# Patient Record
Sex: Female | Born: 1945
Health system: Southern US, Community
[De-identification: ages and names within clinical notes are randomized; demographics above are authoritative.]

## PROBLEM LIST (undated history)

## (undated) DIAGNOSIS — Z833 Family history of diabetes mellitus: Secondary | ICD-10-CM

## (undated) DIAGNOSIS — I10 Essential (primary) hypertension: Secondary | ICD-10-CM

## (undated) DIAGNOSIS — Z96651 Presence of right artificial knee joint: Secondary | ICD-10-CM

## (undated) DIAGNOSIS — E785 Hyperlipidemia, unspecified: Secondary | ICD-10-CM

## (undated) DIAGNOSIS — M8580 Other specified disorders of bone density and structure, unspecified site: Secondary | ICD-10-CM

## (undated) DIAGNOSIS — E119 Type 2 diabetes mellitus without complications: Secondary | ICD-10-CM

## (undated) HISTORY — DX: Family history of diabetes mellitus: Z83.3

## (undated) HISTORY — PX: BREAST EXCISIONAL BIOPSY: SUR124

## (undated) HISTORY — DX: Hyperlipidemia, unspecified: E78.5

## (undated) HISTORY — PX: CHOLECYSTECTOMY: SHX55

## (undated) HISTORY — DX: Other specified disorders of bone density and structure, unspecified site: M85.80

## (undated) HISTORY — DX: Type 2 diabetes mellitus without complications: E11.9

## (undated) HISTORY — DX: Presence of right artificial knee joint: Z96.651

## (undated) HISTORY — PX: OTHER SURGICAL HISTORY: SHX169

---

## 2018-03-24 ENCOUNTER — Other Ambulatory Visit: Payer: Self-pay

## 2018-03-24 ENCOUNTER — Encounter: Payer: Self-pay | Admitting: Family Medicine

## 2018-03-24 ENCOUNTER — Ambulatory Visit: Payer: Medicare Other | Attending: Family Medicine | Admitting: Family Medicine

## 2018-03-24 VITALS — BP 126/74 | HR 69 | Temp 98.4°F | Resp 18 | Ht <= 58 in | Wt 168.2 lb

## 2018-03-24 DIAGNOSIS — K6289 Other specified diseases of anus and rectum: Secondary | ICD-10-CM | POA: Insufficient documentation

## 2018-03-24 DIAGNOSIS — Z79899 Other long term (current) drug therapy: Secondary | ICD-10-CM | POA: Insufficient documentation

## 2018-03-24 DIAGNOSIS — Z833 Family history of diabetes mellitus: Secondary | ICD-10-CM | POA: Diagnosis not present

## 2018-03-24 DIAGNOSIS — E119 Type 2 diabetes mellitus without complications: Secondary | ICD-10-CM

## 2018-03-24 DIAGNOSIS — Z96651 Presence of right artificial knee joint: Secondary | ICD-10-CM | POA: Diagnosis not present

## 2018-03-24 DIAGNOSIS — E785 Hyperlipidemia, unspecified: Secondary | ICD-10-CM | POA: Diagnosis not present

## 2018-03-24 DIAGNOSIS — K644 Residual hemorrhoidal skin tags: Secondary | ICD-10-CM | POA: Insufficient documentation

## 2018-03-24 DIAGNOSIS — R197 Diarrhea, unspecified: Secondary | ICD-10-CM | POA: Insufficient documentation

## 2018-03-24 DIAGNOSIS — I10 Essential (primary) hypertension: Secondary | ICD-10-CM

## 2018-03-24 DIAGNOSIS — M8580 Other specified disorders of bone density and structure, unspecified site: Secondary | ICD-10-CM | POA: Diagnosis not present

## 2018-03-24 DIAGNOSIS — Z9049 Acquired absence of other specified parts of digestive tract: Secondary | ICD-10-CM | POA: Insufficient documentation

## 2018-03-24 DIAGNOSIS — R208 Other disturbances of skin sensation: Secondary | ICD-10-CM | POA: Diagnosis not present

## 2018-03-24 DIAGNOSIS — Z23 Encounter for immunization: Secondary | ICD-10-CM | POA: Diagnosis not present

## 2018-03-24 NOTE — Progress Notes (Signed)
Subjective:    Patient ID: Emily Hamilton, female    DOB: 16-May-1946, 72 y.o.   MRN: [id number]   Due to language barrier, a video interpreter service was used at today's visit  HPI 72 year old female new to the practice.  Patient states that she recently moved from [place].  Patient with complaint of onset of few days ago of a few episodes of diarrhea and patient states that with the diarrhea she also had burning in her rectal area and patient states that she continues to have the burning sensation.  Patient did not see any blood in her stool and no dark stools.  Patient states that her stools are now normal.  Patient did bring medical records with her from [place].  Per her past medical records, patient with a positive fecal occult blood test in February of this year and patient was referred for colonoscopy however she was also having URI symptoms at this time and had to postpone the colonoscopy.  Patient has not yet had a colonoscopy that was recommended.      Patient also has past medical history significant for type 2 diabetes which is well controlled.  Patient had hemoglobin A1c done in February which was 6.7.  Patient also reports that she has a history of abnormal mammogram and on review of her medical records, patient with a retroareolar left breast asymmetry which has been stable since 2013 and is thought to be a benign intramammary lymph node.  Patient also with hypertension and states that her blood pressure has been well controlled.  Patient does not smoke and she does not have any exposure to secondhand smoke.  Patient is not currently working.  Patient also with hyperlipidemia for which she takes atorvastatin 20 mg once daily.  Patient has had surgical history of right knee replacement as well as cholecystectomy.  Patient with history of a fracture of her left arm with secondary left upper extremity neuropathy.  Patient also with history of osteopenia.  Patient with family  history of diabetes as well as glaucoma.  No family history of colon cancer.  Patient reports  eye exam in [place] earlier this year.   Review of Systems  Constitutional: Positive for fatigue.  HENT: Negative for sore throat and trouble swallowing.   Respiratory: Negative for cough and shortness of breath.   Cardiovascular: Negative for chest pain, palpitations and leg swelling.  Gastrointestinal: Positive for diarrhea and rectal pain. Negative for abdominal pain.  Genitourinary: Negative for dysuria and frequency.  Neurological: Negative for dizziness and headaches.       Objective:   Physical Exam BP 126/74   Pulse 69   Temp 98.4 F (36.9 C) (Oral)   Resp 18   Ht 4\' 8"  (1.422 m)   Wt 168 lb 3.2 oz (76.3 kg)   SpO2 98%   BMI 37.71 kg/m Vital signs and nurse's note reviewed General-well-nourished, well-developed older female in no acute distress ENT-TMs gray, nares with mild edema, normal oropharynx Neck-supple, no lymphadenopathy, no thyromegaly, no carotid bruit Lungs-clear to auscultation bilaterally Cardiovascular-regular rate and rhythm Abdomen- sounds, soft, nondistended nontender Back-no CVA tenderness Rectal exam- patient with some mild erythema of the skin surrounding the anus.  Patient with 2 very small, noninflamed hemorrhoidal skin tags.  Patient with edema of vaginal area mucosa and discomfort with insertion of lobe/lubricated finger into the rectal area.  Patient with slightly firm stool in the rectal vault.  Stool was brown and Hemoccult negative Extremities-no edema  Diabetic foot exam-patient with some thickening of her toenails which were polished.  Patient did not have any active skin breakdown on the feet.  Patient with mild bunions bilaterally and early hammertoes on the right foot.  Patient with normal 1-2+ dorsalis pedis and posterior tibial pulses bilaterally.  Normal monofilament exam. Psych- normal mood and judgment      Assessment & Plan:  1. Type  2 diabetes mellitus without complication, without long-term current use of insulin (HCC) Patient will have CMP, lipid panel, hemoglobin A1c and urine microalbumin done in follow-up of her diabetes which per her past records appear to be well controlled.  Patient will also be referred for diabetic eye exam and patient has had history of elevated intraocular pressure per past records. - Comprehensive metabolic panel - Ambulatory referral to Ophthalmology - Lipid panel - Hemoglobin A1c - Microalbumin/Creatinine Ratio, Urine  2. Essential hypertension Patient's blood pressure is currently controlled on her hydrochlorothiazide and lisinopril which she will continue  3. Rectal burning Patient with complaint of continued rectal burning status post an episode of diarrhea.  I suspect the patient has some internal hemorrhoids.  Prescription sent to patient's pharmacy for Anusol [facility] suppositories to help with discomfort.  Patient will also have CBC.  Patient will be referred to gastroenterology for further evaluation and treatment.  Patient review of past records was positive stool in February of this year while she was living in [place] and patient was referred to gastroenterology for colonoscopy but patient states that she never followed up with gastroenterology before moving. - CBC with Differential - Ambulatory referral to Gastroenterology - hydrocortisone (ANUSOL-HC) 25 MG suppository; Place 1 suppository (25 mg total) rectally 2 (two) times daily as needed (Rectal burning).  Dispense: 12 suppository; Refill: 0  4. Hyperlipidemia, unspecified hyperlipidemia type Patient with history of hyperlipidemia for which she is on atorvastatin 40 mg.  Patient will have lipid panel at today's visit.  Patient is encouraged to continue use of her atorvastatin as well as a low-fat diet and exercise as tolerated. - Lipid panel  5. Need for immunization against influenza Patient was offered and agreed to receive  influenza immunization at today's visit.  Educational handout was also provided. - Flu Vaccine QUAD 36+ mos IM  An After Visit Summary was printed and given to the patient.  Return in about 4 months (around 07/24/2018) for Diabetes/chronic medical issues, 4 months with PCP.

## 2018-03-24 NOTE — Progress Notes (Signed)
Pain in anus: about a month  Flu shot: yes

## 2018-03-25 ENCOUNTER — Telehealth: Payer: Self-pay | Admitting: Family Medicine

## 2018-03-25 MED ORDER — HYDROCORTISONE ACETATE 25 MG RE SUPP: 25.0000 mg | Freq: Two times a day (BID) | RECTAL | 0 refills | Status: DC | PRN

## 2018-03-25 NOTE — Telephone Encounter (Signed)
Please call patient's pharmacy and confirmed that prescription received for Anusol HC Suppositories and notify patient.  If prescription is not there, please resend as per yesterday's visit note

## 2018-03-25 NOTE — Telephone Encounter (Signed)
Patient called stating that medications she was prescribed yesterday are not yet at the pharmacy, she would like to know what's going on because she needs her medication. Please follow up as soon as possible.

## 2018-03-26 ENCOUNTER — Other Ambulatory Visit: Payer: Self-pay

## 2018-04-06 NOTE — Telephone Encounter (Signed)
Can you please try to contact patient or pharmacy and then close note which has been open 12 or more days

## 2018-04-09 NOTE — Telephone Encounter (Signed)
Please attempt to contact the patient.  If you are unable to contact the patient please close this encounter which has been open for an excessive number of days.

## 2018-04-12 NOTE — Telephone Encounter (Signed)
[employer] interpreter Byrd Hesselbach [phone]: attempted to call the patient 3xs, everytime she received a message stating this phone number cannot accept calls at this time. Unable to inform patient.

## 2018-07-21 ENCOUNTER — Ambulatory Visit: Payer: Medicaid Other | Admitting: Family Medicine

## 2018-09-08 ENCOUNTER — Telehealth: Payer: Self-pay | Admitting: Family Medicine

## 2018-09-08 ENCOUNTER — Ambulatory Visit: Payer: Medicare Other | Attending: Family Medicine | Admitting: Physician Assistant

## 2018-09-08 VITALS — BP 113/76 | HR 97 | Temp 98.5°F | Resp 16 | Wt 168.2 lb

## 2018-09-08 DIAGNOSIS — N644 Mastodynia: Secondary | ICD-10-CM | POA: Diagnosis not present

## 2018-09-08 DIAGNOSIS — Z1239 Encounter for other screening for malignant neoplasm of breast: Secondary | ICD-10-CM | POA: Diagnosis not present

## 2018-09-08 DIAGNOSIS — E119 Type 2 diabetes mellitus without complications: Secondary | ICD-10-CM | POA: Diagnosis not present

## 2018-09-08 DIAGNOSIS — I10 Essential (primary) hypertension: Secondary | ICD-10-CM | POA: Diagnosis not present

## 2018-09-08 DIAGNOSIS — E1165 Type 2 diabetes mellitus with hyperglycemia: Secondary | ICD-10-CM | POA: Diagnosis not present

## 2018-09-08 LAB — POCT GLYCOSYLATED HEMOGLOBIN (HGB A1C): HbA1c, POC (prediabetic range): 6.4 % (ref 5.7–6.4)

## 2018-09-08 LAB — GLUCOSE, POCT (MANUAL RESULT ENTRY): POC GLUCOSE: 206 mg/dL — AB (ref 70–99)

## 2018-09-08 MED ORDER — METFORMIN HCL 500 MG PO TABS: 1000.0000 mg | ORAL_TABLET | Freq: Two times a day (BID) | ORAL | 3 refills | Status: DC

## 2018-09-08 MED ORDER — ASPIRIN EC 81 MG PO TBEC: 81.0000 mg | DELAYED_RELEASE_TABLET | Freq: Every day | ORAL | 3 refills | Status: DC

## 2018-09-08 MED ORDER — LISINOPRIL 5 MG PO TABS: 5.0000 mg | ORAL_TABLET | Freq: Every day | ORAL | 1 refills | Status: DC

## 2018-09-08 MED ORDER — ISOSORBIDE MONONITRATE ER 30 MG PO TB24: 30.0000 mg | ORAL_TABLET | Freq: Every day | ORAL | 3 refills | Status: DC

## 2018-09-08 MED ORDER — HYDROCHLOROTHIAZIDE 12.5 MG PO CAPS: 12.5000 mg | ORAL_CAPSULE | Freq: Every day | ORAL | 3 refills | Status: DC

## 2018-09-08 MED ORDER — ATORVASTATIN CALCIUM 40 MG PO TABS: 40.0000 mg | ORAL_TABLET | Freq: Every day | ORAL | 3 refills | Status: DC

## 2018-09-08 NOTE — Progress Notes (Signed)
Patient ID: Emily Hamilton, female   DOB: 1945/10/26, 73 y.o.   MRN: [id number]   Emily Hamilton, is a 73 y.o. female  GLO:[id number]  JOA:[id number]  DOB - 05/06/1946  Subjective:  Chief Complaint and HPI: Emily Hamilton is a 73 y.o. female here for B breast pain R>L for about 1-2 months.  No lump or mass.  Doesn't check blood sugars regularly.  Compliant with meds.  Last MMG ~1.5 years ago.    Manuel with [employer] interpre[employer] translating.    ROS:   Constitutional:  No f/c, No night sweats, No unexplained weight loss. EENT:  No vision changes, No blurry vision, No hearing changes. No mouth, throat, or ear problems.  Respiratory: No cough, No SOB Cardiac: No CP, no palpitations GI:  No abd pain, No N/V/D. GU: No Urinary s/sx Musculoskeletal: No joint pain Neuro: No headache, no dizziness, no motor weakness.  Skin: No rash Endocrine:  No polydipsia. No polyuria.  Psych: Denies SI/HI  Problem  Type 2 Diabetes Mellitus Without Complication, Without Long-Term Current Use of Insulin (Hcc)    ALLERGIES: Not on File  PAST MEDICAL HISTORY: No past medical history on file.  MEDICATIONS AT HOME: Prior to Admission medications   Medication Sig Start Date End Date Taking? Authorizing Provider  aspirin EC 81 MG tablet Take 1 tablet (81 mg total) by mouth daily. 09/08/18  Yes Georgian Co M, PA-C  atorvastatin (LIPITOR) 40 MG tablet Take 1 tablet (40 mg total) by mouth daily. 09/08/18  Yes Georgian Co M, PA-C  hydrochlorothiazide (MICROZIDE) 12.5 MG capsule Take 1 capsule (12.5 mg total) by mouth daily. 09/08/18  Yes Areyanna Figeroa M, PA-C  hydrocortisone (ANUSOL-HC) 25 MG suppository Place 1 suppository (25 mg total) rectally 2 (two) times daily as needed (Rectal burning). 03/25/18  Yes Fulp, Cammie, MD  isosorbide mononitrate (IMDUR) 30 MG 24 hr tablet Take 1 tablet (30 mg total) by mouth daily. Take 1/2 tablet by mouth daily 09/08/18  Yes Taziah Difatta M, PA-C    lisinopril (PRINIVIL,ZESTRIL) 5 MG tablet Take 1 tablet (5 mg total) by mouth daily. 09/08/18  Yes Georgian Co M, PA-C  metFORMIN (GLUCOPHAGE) 500 MG tablet Take 2 tablets (1,000 mg total) by mouth 2 (two) times daily with a meal. 09/08/18  Yes Anders Simmonds, PA-C     Objective:  EXAM:   Vitals:   09/08/18 0955  BP: 113/76  Pulse: 97  Resp: 16  Temp: 98.5 F (36.9 C)  TempSrc: Oral  SpO2: 95%  Weight: 168 lb 3.2 oz (76.3 kg)    General appearance : A&OX3. NAD. Non-toxic-appearing HEENT: Atraumatic and Normocephalic.  PERRLA. EOM intact.   Chest/Lungs:  Breathing-non-labored, Good air entry bilaterally, breath sounds normal without rales, rhonchi, or wheezing  CVS: S1 S2 regular, no murmurs, gallops, rubs  B breasts and axilla examined.  No skin changes.  No nipple discharge.  No mass/lump B.   Extremities: Bilateral Lower Ext shows no edema, both legs are warm to touch with = pulse throughout Neurology:  CN II-XII grossly intact, Non focal.   Psych:  TP linear. J/I WNL. Normal speech. Appropriate eye contact and affect.  Skin:  No Rash  Data Review Lab Results  Component Value Date   HGBA1C 6.4 09/08/2018     Assessment & Plan   1. Breast pain No abnormality on exam - MM Digital Screening; Future  2. Type 2 diabetes mellitus without complication, without long-term current use of insulin (HCC)  Uncontrolled.  Work on diet.  increase dose of metformin - Glucose (CBG) - HgB A1c - Comprehensive metabolic panel - Lipid panel - CBC with Differential/Platelet - metFORMIN (GLUCOPHAGE) 500 MG tablet; Take 2 tablets (1,000 mg total) by mouth 2 (two) times daily with a meal.  Dispense: 120 tablet; Refill: 3 - atorvastatin (LIPITOR) 40 MG tablet; Take 1 tablet (40 mg total) by mouth daily.  Dispense: 30 tablet; Refill: 3 - aspirin EC 81 MG tablet; Take 1 tablet (81 mg total) by mouth daily.  Dispense: 90 tablet; Refill: 3  3. Essential hypertension Controlled.   Continue current regimen - lisinopril (PRINIVIL,ZESTRIL) 5 MG tablet; Take 1 tablet (5 mg total) by mouth daily.  Dispense: 90 tablet; Refill: 1 - aspirin EC 81 MG tablet; Take 1 tablet (81 mg total) by mouth daily.  Dispense: 90 tablet; Refill: 3 - hydrochlorothiazide (MICROZIDE) 12.5 MG capsule; Take 1 capsule (12.5 mg total) by mouth daily.  Dispense: 30 capsule; Refill: 3 - isosorbide mononitrate (IMDUR) 30 MG 24 hr tablet; Take 1 tablet (30 mg total) by mouth daily. Take 1/2 tablet by mouth daily  Dispense: 30 tablet; Refill: 3  4. Breast cancer screening - MM Digital Screening; Future  Patient have been counseled extensively about nutrition and exercise  F/up 3 months  The patient was given clear instructions to go to ER or return to medical center if symptoms don't improve, worsen or new problems develop. The patient verbalized understanding. The patient was told to call to get lab results if they haven't heard anything in the next week.     Georgian Co, PA-C [facility] [place], [place] [phone]   09/08/2018, 10:16 AM

## 2018-09-08 NOTE — Telephone Encounter (Signed)
Greig Castilla with [facility] called for clarification in regards to the isosorbide mononitrate (IMDUR) 30 MG 24 hr tablet [[id number]]  Please follow up.

## 2018-09-08 NOTE — Telephone Encounter (Signed)
Rx needs clarification in sig. Unclear whether pt is to take 1 or 1/2 tablet daily. Will forward to PCP.

## 2018-09-09 LAB — LIPID PANEL
Chol/HDL Ratio: 2.2 ratio (ref 0.0–4.4)
Cholesterol, Total: 107 mg/dL (ref 100–199)
HDL: 48 mg/dL (ref >39)
LDL Calculated: 24 mg/dL (ref 0–99)
Triglycerides: 175 mg/dL — ABNORMAL HIGH (ref 0–149)
VLDL Cholesterol Cal: 35 mg/dL (ref 5–40)

## 2018-09-09 LAB — COMPREHENSIVE METABOLIC PANEL
A/G RATIO: 1.4 (ref 1.2–2.2)
ALK PHOS: 120 IU/L — AB (ref 39–117)
ALT: 26 IU/L (ref 0–32)
AST: 34 IU/L (ref 0–40)
Albumin: 4.4 g/dL (ref 3.8–4.8)
BILIRUBIN TOTAL: 0.3 mg/dL (ref 0.0–1.2)
BUN/Creatinine Ratio: 16 (ref 12–28)
BUN: 13 mg/dL (ref 8–27)
CHLORIDE: 101 mmol/L (ref 96–106)
CO2: 20 mmol/L (ref 20–29)
Calcium: 9.5 mg/dL (ref 8.7–10.3)
Creatinine, Ser: 0.79 mg/dL (ref 0.57–1.00)
GFR calc non Af Amer: 76 mL/min/{1.73_m2} (ref >59)
GFR, EST AFRICAN AMERICAN: 88 mL/min/{1.73_m2} (ref >59)
Globulin, Total: 3.1 g/dL (ref 1.5–4.5)
Glucose: 193 mg/dL — ABNORMAL HIGH (ref 65–99)
POTASSIUM: 4 mmol/L (ref 3.5–5.2)
Sodium: 140 mmol/L (ref 134–144)
Total Protein: 7.5 g/dL (ref 6.0–8.5)

## 2018-09-09 LAB — CBC WITH DIFFERENTIAL/PLATELET
Basophils Absolute: 0 10*3/uL (ref 0.0–0.2)
Basos: 0 %
EOS (ABSOLUTE): 0.1 10*3/uL (ref 0.0–0.4)
Eos: 2 %
HEMOGLOBIN: 13.5 g/dL (ref 11.1–15.9)
Hematocrit: 40.9 % (ref 34.0–46.6)
IMMATURE GRANULOCYTES: 0 %
Immature Grans (Abs): 0 10*3/uL (ref 0.0–0.1)
Lymphocytes Absolute: 1.8 10*3/uL (ref 0.7–3.1)
Lymphs: 24 %
MCH: 28.2 pg (ref 26.6–33.0)
MCHC: 33 g/dL (ref 31.5–35.7)
MCV: 86 fL (ref 79–97)
MONOCYTES: 4 %
Monocytes Absolute: 0.3 10*3/uL (ref 0.1–0.9)
NEUTROS PCT: 70 %
Neutrophils Absolute: 5.3 10*3/uL (ref 1.4–7.0)
Platelets: 244 10*3/uL (ref 150–450)
RBC: 4.78 x10E6/uL (ref 3.77–5.28)
RDW: 13.2 % (ref 11.7–15.4)
WBC: 7.6 10*3/uL (ref 3.4–10.8)

## 2018-09-09 NOTE — Telephone Encounter (Signed)
When I last saw patient, she was only on lisinopril and hydrochlorothiazide.  I am not sure if the Imdur was started as a new medication when she was recently seen by another provider.  Please try to forward to the provider who most recently saw the patient and if there is no clarification on medication then please call patient and see if patient or family member can clarify dose of medication

## 2018-09-10 NOTE — Telephone Encounter (Signed)
Will forward request to Marylene Land, who placed the order on 09/08/18.

## 2018-09-14 ENCOUNTER — Telehealth: Payer: Self-pay

## 2018-09-14 NOTE — Telephone Encounter (Signed)
[employer] Byrd Hesselbach  Id#  [id number] contacted pt to go over lab results unable to reach pt due to both numbers are busy

## 2018-10-18 ENCOUNTER — Other Ambulatory Visit: Payer: Self-pay

## 2018-10-18 ENCOUNTER — Encounter: Payer: Self-pay | Admitting: Family Medicine

## 2018-10-18 ENCOUNTER — Ambulatory Visit: Payer: Medicare Other | Attending: Family Medicine | Admitting: Family Medicine

## 2018-10-18 DIAGNOSIS — E785 Hyperlipidemia, unspecified: Secondary | ICD-10-CM | POA: Insufficient documentation

## 2018-10-18 DIAGNOSIS — R350 Frequency of micturition: Secondary | ICD-10-CM | POA: Diagnosis not present

## 2018-10-18 DIAGNOSIS — M8580 Other specified disorders of bone density and structure, unspecified site: Secondary | ICD-10-CM | POA: Insufficient documentation

## 2018-10-18 DIAGNOSIS — R300 Dysuria: Secondary | ICD-10-CM | POA: Diagnosis not present

## 2018-10-18 MED ORDER — SULFAMETHOXAZOLE-TRIMETHOPRIM 800-160 MG PO TABS: 1.0000 | ORAL_TABLET | Freq: Two times a day (BID) | ORAL | 0 refills | Status: AC

## 2018-10-18 NOTE — Progress Notes (Signed)
Virtual Visit via Telephone Note  I connected with Emily Hamilton on 10/18/18 at  by telephone and verified that I am speaking with the correct person using two identifiers.  Due to COVID-19 pandemic and limitations/rstrictions on in-office visits, today's scheduled clinic visit is being converted to a tele-health visit   I discussed the limitations, risks, security and privacy concerns of performing an evaluation and management service by telephone and the availability of in person appointments. I also discussed with the patient that there may be a patient responsible charge related to this service. The patient expressed understanding and agreed to proceed.   History of Present Illness:     73 year old female with well-controlled type 2 diabetes with most recent hemoglobin A1c of 6.4 on 09/08/2018 who has complaint of 22 days of burning/discomfort with urination.  She also states that 15 days ago she had 1 to 2 days of nausea/vomiting and diarrhea but this has resolved.  Patient states that she does not know what her blood sugar level is that she does not have a machine to check her blood sugars.  Patient also with complaint of having a bitter taste in her mouth recently.  Patient denies any fever or chills.  No abdominal or pelvic pain.  No back pain.  No current nausea.  Patient states that she was told that she can come into the office in the next 1 to 2 days to give a urine sample.  Past Medical History:  Diagnosis Date  . Controlled type 2 diabetes mellitus (HCC)   . Family history of diabetes mellitus   . Hyperlipidemia   . Osteopenia   . Status post right knee replacement    Past Surgical History:  Procedure Laterality Date  . CHOLECYSTECTOMY    . Right knee replacement     Family History  Problem Relation Age of Onset  . Diabetes Mother   . Glaucoma Mother    Social History   Tobacco Use  . Smoking status: Never Smoker  . Smokeless tobacco: Never Used  Substance Use  Topics  . Alcohol use: Never    Frequency: Never  . Drug use: Never   No Known Allergies   Review of Systems  Constitutional: Negative for chills and fever.  HENT: Negative for congestion and sore throat.        Complaint of a recent bitter taste in her mouth  Eyes: Negative for blurred vision and double vision.  Respiratory: Negative for cough and shortness of breath.   Cardiovascular: Negative for chest pain and palpitations.  Gastrointestinal: Negative for abdominal pain, constipation, heartburn, nausea and vomiting.  Genitourinary: Positive for dysuria and frequency.  Musculoskeletal: Positive for joint pain (occasional knee and shoulder pain). Negative for myalgias.  Skin: Negative for itching and rash.  Neurological: Negative for dizziness and headaches.     Observations/Objective:  No vital signs or physical exam as visit was conducted via telephone  Assessment and Plan: 1. Dysuria Patient with complaint of dysuria and urinary frequency. Patient has been asked to come into the clinic and leave urine sample for UA and she thinks that she will be able to do this tomorrow. Rx is being sent in for her to take Bactrim DS twice per day x 3 days which should treat an uncomplicated UTI but she should not take the antibiotic until after giving sample for UA.  - sulfamethoxazole-trimethoprim (BACTRIM DS,SEPTRA DS) 800-160 MG tablet; Take 1 tablet by mouth 2 (two) times daily for  3 days.  Dispense: 6 tablet; Refill: 0  2. Urinary frequency Will have patient come in to leave a sample for UA due to suspected UTI and RX sent in for Septra DS for her to take after leaving urine sample. Patient with type 2 diabetes but this has been well controlled with most recent Hbg A1c of 6.4. - sulfamethoxazole-trimethoprim (BACTRIM DS,SEPTRA DS) 800-160 MG tablet; Take 1 tablet by mouth 2 (two) times daily for 3 days.  Dispense: 6 tablet; Refill: 0   Follow Up Instructions:    I discussed the  assessment and treatment plan with the patient. The patient was provided an opportunity to ask questions and all were answered. The patient agreed with the plan and demonstrated an understanding of the instructions.   The patient was advised to call back or seek an in-person evaluation if the symptoms worsen or if the condition fails to improve as anticipated.  I provided 8  minutes of non-face-to-face time during this encounter.   Cain Saupe, MD

## 2018-10-18 NOTE — Progress Notes (Signed)
Patient verified DOB Patient has taken medication today. Patient has eaten today. Patient has not pain. Patient complains of burning while she urinates for the past 22 days. Patient has drank cranberry to help with no relief. Patient is able to drop a sample today or tomorrow.

## 2018-11-24 ENCOUNTER — Telehealth: Payer: Self-pay | Admitting: Emergency Medicine

## 2018-11-24 ENCOUNTER — Other Ambulatory Visit: Payer: Self-pay

## 2018-11-24 ENCOUNTER — Ambulatory Visit: Payer: Medicare Other | Attending: Family Medicine | Admitting: Physician Assistant

## 2018-11-24 DIAGNOSIS — Z789 Other specified health status: Secondary | ICD-10-CM | POA: Diagnosis not present

## 2018-11-24 DIAGNOSIS — R300 Dysuria: Secondary | ICD-10-CM | POA: Diagnosis not present

## 2018-11-24 LAB — POCT URINALYSIS DIP (CLINITEK)
Bilirubin, UA: NEGATIVE
Blood, UA: NEGATIVE
Glucose, UA: NEGATIVE mg/dL
Ketones, POC UA: NEGATIVE mg/dL
Nitrite, UA: NEGATIVE
POC PROTEIN,UA: NEGATIVE
Spec Grav, UA: 1.01 (ref 1.010–1.025)
Urobilinogen, UA: 0.2 E.U./dL
pH, UA: 6.5 (ref 5.0–8.0)

## 2018-11-24 MED ORDER — FLUCONAZOLE 150 MG PO TABS: 150.0000 mg | ORAL_TABLET | Freq: Once | ORAL | 0 refills | Status: AC

## 2018-11-24 MED ORDER — SULFAMETHOXAZOLE-TRIMETHOPRIM 800-160 MG PO TABS: 1.0000 | ORAL_TABLET | Freq: Two times a day (BID) | ORAL | 0 refills | Status: DC

## 2018-11-24 NOTE — Progress Notes (Signed)
Interpreter- [phone]  Dysuria with voiding x 1 month Had test done but unaware of the results Still has sx's.

## 2018-11-24 NOTE — Telephone Encounter (Signed)
Patients called.  Patient identified by name and date of birth.  Patient advised that she had leukocytes in her urine.  Patient was advised that the provider has [facility] which she should pick up and start taking.  Patient acknowledged understanding of advice.

## 2018-11-24 NOTE — Progress Notes (Signed)
Patient ID: Emily Hamilton, female   DOB: 19-Apr-1946, 73 y.o.   MRN: [id number]   Virtual Visit via Telephone Note  I connected with Emily Hamilton on 11/24/18 at  8:50 AM EDT by telephone and verified that I am speaking with the correct person using two identifiers.   I discussed the limitations, risks, security and privacy concerns of performing an evaluation and management service by telephone and the availability of in person appointments. I also discussed with the patient that there may be a patient responsible charge related to this service. The patient expressed understanding and agreed to proceed.  Patient location:  home My Location:  [facility] office Persons on the call:  Jesus(interpreter), patient, and myself.    History of Present Illness:  C/o dysuria for about 1 month.  Took 3 days of antibiotic and had no improvement.  No pelvic/abdominal pain.  No fever or flank pain.  No vaginal discharge.  Pain is mostly at end of urine stream and not with every urination.    Observations/Objective: TP linear.  A&Ox3.    Assessment and Plan: 1. Dysuria Increase water intake.  Drop off urine sample today.  To ED/UC if worsens -septra DS 1 bid X 7 days and diflucan if needed - POCT URINALYSIS DIP (CLINITEK) - Urine cytology ancillary only; Future - Urine Culture; Future  2. Language barrier [employer] used and additional time performing visit was required.     Follow Up Instructions: 1-2 months with PCP   I discussed the assessment and treatment plan with the patient. The patient was provided an opportunity to ask questions and all were answered. The patient agreed with the plan and demonstrated an understanding of the instructions.   The patient was advised to call back or seek an in-person evaluation if the symptoms worsen or if the condition fails to improve as anticipated.  I provided 11 minutes of non-face-to-face time during this encounter.   Georgian Co, PA-C

## 2018-11-26 LAB — URINE CULTURE

## 2018-11-30 ENCOUNTER — Other Ambulatory Visit: Payer: Self-pay | Admitting: Physician Assistant

## 2018-11-30 MED ORDER — FLUCONAZOLE 150 MG PO TABS: 150.0000 mg | ORAL_TABLET | Freq: Once | ORAL | 0 refills | Status: AC

## 2018-11-30 MED ORDER — NITROFURANTOIN MONOHYD MACRO 100 MG PO CAPS: 100.0000 mg | ORAL_CAPSULE | Freq: Two times a day (BID) | ORAL | 0 refills | Status: DC

## 2018-12-02 NOTE — Telephone Encounter (Signed)
Patient contacted via phone to be given results of labs.  Patient identified by name and date of birth.  Patient given results of labs.  Patient educated on lab results. Questions answered. Patient told about new prescription.  Patient acknowledged understanding of labs results.

## 2018-12-08 ENCOUNTER — Ambulatory Visit: Payer: Medicare Other | Admitting: Family Medicine

## 2018-12-14 ENCOUNTER — Ambulatory Visit: Payer: Medicare Other | Admitting: Family Medicine

## 2019-01-12 ENCOUNTER — Other Ambulatory Visit: Payer: Self-pay | Admitting: Family Medicine

## 2019-01-12 DIAGNOSIS — Z1231 Encounter for screening mammogram for malignant neoplasm of breast: Secondary | ICD-10-CM

## 2019-01-14 ENCOUNTER — Other Ambulatory Visit: Payer: Self-pay | Admitting: Physician Assistant

## 2019-01-14 DIAGNOSIS — E119 Type 2 diabetes mellitus without complications: Secondary | ICD-10-CM

## 2019-01-20 ENCOUNTER — Ambulatory Visit: Payer: Medicare Other | Attending: Family Medicine | Admitting: Family Medicine

## 2019-01-20 ENCOUNTER — Encounter: Payer: Self-pay | Admitting: Family Medicine

## 2019-01-20 ENCOUNTER — Other Ambulatory Visit: Payer: Self-pay | Admitting: Family Medicine

## 2019-01-20 ENCOUNTER — Other Ambulatory Visit: Payer: Self-pay

## 2019-01-20 VITALS — BP 121/74 | HR 81 | Temp 98.7°F | Ht <= 58 in | Wt 157.0 lb

## 2019-01-20 DIAGNOSIS — Z7982 Long term (current) use of aspirin: Secondary | ICD-10-CM | POA: Insufficient documentation

## 2019-01-20 DIAGNOSIS — Z96651 Presence of right artificial knee joint: Secondary | ICD-10-CM | POA: Diagnosis not present

## 2019-01-20 DIAGNOSIS — Z7984 Long term (current) use of oral hypoglycemic drugs: Secondary | ICD-10-CM | POA: Diagnosis not present

## 2019-01-20 DIAGNOSIS — Z87440 Personal history of urinary (tract) infections: Secondary | ICD-10-CM | POA: Insufficient documentation

## 2019-01-20 DIAGNOSIS — E785 Hyperlipidemia, unspecified: Secondary | ICD-10-CM | POA: Diagnosis not present

## 2019-01-20 DIAGNOSIS — D720 Genetic anomalies of leukocytes: Secondary | ICD-10-CM | POA: Diagnosis not present

## 2019-01-20 DIAGNOSIS — I10 Essential (primary) hypertension: Secondary | ICD-10-CM | POA: Diagnosis not present

## 2019-01-20 DIAGNOSIS — E119 Type 2 diabetes mellitus without complications: Secondary | ICD-10-CM

## 2019-01-20 DIAGNOSIS — Z833 Family history of diabetes mellitus: Secondary | ICD-10-CM | POA: Diagnosis not present

## 2019-01-20 DIAGNOSIS — R82998 Other abnormal findings in urine: Secondary | ICD-10-CM

## 2019-01-20 DIAGNOSIS — N3090 Cystitis, unspecified without hematuria: Secondary | ICD-10-CM

## 2019-01-20 DIAGNOSIS — Z9049 Acquired absence of other specified parts of digestive tract: Secondary | ICD-10-CM | POA: Diagnosis not present

## 2019-01-20 DIAGNOSIS — Z79899 Other long term (current) drug therapy: Secondary | ICD-10-CM | POA: Diagnosis not present

## 2019-01-20 DIAGNOSIS — R200 Anesthesia of skin: Secondary | ICD-10-CM

## 2019-01-20 MED ORDER — CEPHALEXIN 500 MG PO CAPS: 500.0000 mg | ORAL_CAPSULE | Freq: Two times a day (BID) | ORAL | 0 refills | Status: AC

## 2019-01-20 NOTE — Progress Notes (Signed)
Established Patient Office Visit  Subjective:  Patient ID: Emily Hamilton, female    DOB: 1946/03/16  Age: 73 y.o. MRN: [id number]   Due to a language barrier, [employer] audio interpreter used at today's visit  CC: Follow-up diabetes  HPI Emily Hamilton presents for follow-up of chronic issues including diabetes, hypertension, hyperlipidemia, recurrent urinary tract infections and patient with complaint of recent onset within the past few months of occasional numbness in her fingers and toes.  Numbness is not painful and is not constant.  She reports that she is taking all of her medications.  She does not check her blood sugars at home.  She has had no increased thirst.  She does have some mild increase in urinary frequency.  She has also noticed some mild dysuria that is not as bad as when she had to come in and was treated for urinary tract infection a few months ago.  She denies any fever or chills.  No abdominal pain.  She has had no nausea/vomiting/diarrhea or constipation.      She denies any headaches or dizziness related to her blood pressure.  No cough related to her use of lisinopril.  No increased muscle or joint pain related to her use of atorvastatin for hyperlipidemia.  She has had no episodes of focal numbness or weakness other than the occasional numbness in her fingers and toes.  She has had no increased muscle or joint pain.  No chest pain or palpitations.  No increased swelling in her legs.  Past Medical History:  Diagnosis Date  . Controlled type 2 diabetes mellitus (HCC)   . Family history of diabetes mellitus   . Hyperlipidemia   . Osteopenia   . Status post right knee replacement     Past Surgical History:  Procedure Laterality Date  . CHOLECYSTECTOMY    . Right knee replacement      Family History  Problem Relation Age of Onset  . Diabetes Mother   . Glaucoma Mother     Social History   Tobacco Use  . Smoking status: Never Smoker  .  Smokeless tobacco: Never Used  Substance Use Topics  . Alcohol use: Never    Frequency: Never  . Drug use: Never    Outpatient Medications Prior to Visit  Medication Sig Dispense Refill  . aspirin EC 81 MG tablet Take 1 tablet (81 mg total) by mouth daily. 90 tablet 3  . atorvastatin (LIPITOR) 40 MG tablet Take 1 tablet (40 mg total) by mouth daily. 30 tablet 3  . hydrochlorothiazide (MICROZIDE) 12.5 MG capsule Take 1 capsule (12.5 mg total) by mouth daily. 30 capsule 3  . hydrocortisone (ANUSOL-HC) 25 MG suppository Place 1 suppository (25 mg total) rectally 2 (two) times daily as needed (Rectal burning). 12 suppository 0  . isosorbide mononitrate (IMDUR) 30 MG 24 hr tablet Take 1 tablet (30 mg total) by mouth daily. Take 1/2 tablet by mouth daily 30 tablet 3  . lisinopril (PRINIVIL,ZESTRIL) 5 MG tablet Take 1 tablet (5 mg total) by mouth daily. 90 tablet 1  . metFORMIN (GLUCOPHAGE) 500 MG tablet TAKE 2 TABLETS (1,000 MG TOTAL) BY MOUTH 2 (TWO) TIMES DAILY WITH A MEAL. 360 tablet 0  . nitrofurantoin, macrocrystal-monohydrate, (MACROBID) 100 MG capsule Take 1 capsule (100 mg total) by mouth 2 (two) times daily. 20 capsule 0  . sulfamethoxazole-trimethoprim (BACTRIM DS) 800-160 MG tablet Take 1 tablet by mouth 2 (two) times daily. 14 tablet 0  No facility-administered medications prior to visit.     No Known Allergies  ROS Review of Systems  Constitutional: Negative for chills, fatigue and fever.  HENT: Negative for congestion, sore throat and trouble swallowing.   Eyes: Negative for photophobia and visual disturbance.  Respiratory: Negative for cough and shortness of breath.   Cardiovascular: Negative for chest pain, palpitations and leg swelling.  Gastrointestinal: Negative for abdominal pain, constipation, diarrhea and nausea.  Endocrine: Negative for cold intolerance, heat intolerance, polydipsia, polyphagia and polyuria.  Genitourinary: Positive for dysuria (mild) and frequency  (mild).  Musculoskeletal: Negative for arthralgias and back pain.  Skin: Negative for color change, rash and wound.  Neurological: Positive for numbness. Negative for dizziness and headaches.  Hematological: Negative for adenopathy. Does not bruise/bleed easily.  Psychiatric/Behavioral: Negative for self-injury and suicidal ideas.      Objective:    Physical Exam  Constitutional: She is oriented to person, place, and time. She appears well-developed and well-nourished.  Neck: Neck supple. No JVD present. No thyromegaly present.  Cardiovascular: Normal rate, regular rhythm and intact distal pulses.  No carotid bruit  Pulmonary/Chest: Effort normal and breath sounds normal.  Abdominal: Soft. There is no abdominal tenderness. There is no rebound and no guarding.  Genitourinary:    Genitourinary Comments: No CVA tenderness   Musculoskeletal:        General: No tenderness or edema.  Lymphadenopathy:    She has no cervical adenopathy.  Neurological: She is alert and oriented to person, place, and time.  Skin: Skin is warm and dry.  No active skin breakdown on the feet  Psychiatric: She has a normal mood and affect. Her behavior is normal. Judgment and thought content normal.  Nursing note and vitals reviewed. DM foot exam performed at today's visit. Sensory exam of the foot is normal, in 10 out 10 area on each foot performed with monofilament.  Intact distal peripheral pulses. No lesions or active skin breakdown on the feet. Good nail care.   BP 121/74 (BP Location: Left Arm, Patient Position: Sitting, Cuff Size: Normal)   Pulse 81   Temp 98.7 F (37.1 C) (Oral)   Ht 4\' 8"  (1.422 m)   Wt 157 lb (71.2 kg)   SpO2 96%   BMI 35.20 kg/m  Wt Readings from Last 3 Encounters:  09/08/18 168 lb 3.2 oz (76.3 kg)  03/24/18 168 lb 3.2 oz (76.3 kg)     Health Maintenance Due  Topic Date Due  . Hepatitis C Screening  Oct 22, 1945  . FOOT EXAM  02/22/1958  . OPHTHALMOLOGY EXAM  02/22/1958   . TETANUS/TDAP  02/23/1967  . MAMMOGRAM  02/22/1998  . DEXA SCAN  02/22/2013  . PNA vac Low Risk Adult (1 of 2 - PCV13) 02/22/2013    No results found for: TSH Lab Results  Component Value Date   WBC 7.6 09/08/2018   HGB 13.5 09/08/2018   HCT 40.9 09/08/2018   MCV 86 09/08/2018   PLT 244 09/08/2018   Lab Results  Component Value Date   NA 140 09/08/2018   K 4.0 09/08/2018   CO2 20 09/08/2018   GLUCOSE 193 (H) 09/08/2018   BUN 13 09/08/2018   CREATININE 0.79 09/08/2018   BILITOT 0.3 09/08/2018   ALKPHOS 120 (H) 09/08/2018   AST 34 09/08/2018   ALT 26 09/08/2018   PROT 7.5 09/08/2018   ALBUMIN 4.4 09/08/2018   CALCIUM 9.5 09/08/2018   Lab Results  Component Value Date   CHOL 107  09/08/2018   Lab Results  Component Value Date   HDL 48 09/08/2018   Lab Results  Component Value Date   LDLCALC 24 09/08/2018   Lab Results  Component Value Date   TRIG 175 (H) 09/08/2018   Lab Results  Component Value Date   CHOLHDL 2.2 09/08/2018   Lab Results  Component Value Date   HGBA1C 6.4 09/08/2018      Assessment & Plan:  1. Type 2 diabetes mellitus without complication, without long-term current use of insulin (HCC) We will perform hemoglobin A1c and urinalysis as well as microalbumin/creatinine ratio and CMP in follow-up of patient's diabetes.  Patient with normal monofilament exam but complains of occasional numbness in her fingertips and toes.  She is on metformin which can cause vitamin B12 deficiencies therefore will check vitamin B12 level and notify patient if vitamin D therapy is needed.  Patient is encouraged to try and check her home blood sugars.  Continue low carbohydrate diet and low impact cardiovascular exercise. (Order removed for hemoglobin A1c as it has not yet been resulted and patient encounter cannot be closed with open order). - POCT URINALYSIS DIP (CLINITEK) - HgB A1c - Comprehensive metabolic panel - Microalbumin/Creatinine Ratio, Urine -  Vitamin B12  2. Essential hypertension Blood pressures well controlled at today's visit at 121/74.  Patient is aware of goal blood pressure of 130/80 or less.  She is to continue low-sodium/DASH diet as well as regular cardiovascular exercise.  Urinalysis and microalbumin creatinine ratio to look for proteinuria.  Continue lisinopril, hydrochlorothiazide and patient is also on Imdur which will also lower blood pressure. - POCT URINALYSIS DIP (CLINITEK) - Microalbumin/Creatinine Ratio, Urine  3. Hyperlipidemia, unspecified hyperlipidemia type Continue the use of atorvastatin 40 mg for hyperlipidemia as well as a low-fat diet and regular cardiovascular exercise.  CMP in follow-up of use of statin medication - Comprehensive metabolic panel  4. Encounter for long-term current use of medication Will obtain CMP and vitamin B12 level in follow-up of long-term use of medications for treatment of diabetes, hypertension and hyperlipidemia - Comprehensive metabolic panel - Vitamin C78  5. Numbness in feet Patient with complaint of some recurrent numbness in her hands and feet.  Patient with normal monofilament exam.  Will obtain vitamin B12 level as patient is on metformin which can decrease vitamin B12 levels and vitamin B12 levels can be associated with paresthesias/numbness - Vitamin B12  6. Leukocytes in urine; 7.  Cystitis without hematuria Patient with leukocytes in the urine and has had recurrent urinary tract infections.  Urine will be sent for culture and in the interim, patient will be placed on Keflex 500 mg twice daily x7 days based on past urine culture results.  Patient will be notified of the urine culture results and if any additional treatment is warranted based on those results - Urine Culture  Allergies as of 01/20/2019   No Known Allergies     Medication List       Accurate as of January 20, 2019 11:59 PM. If you have any questions, ask your nurse or doctor.        aspirin EC 81  MG tablet Take 1 tablet (81 mg total) by mouth daily.   atorvastatin 40 MG tablet Commonly known as: LIPITOR Take 1 tablet (40 mg total) by mouth daily.   cephALEXin 500 MG capsule Commonly known as: KEFLEX Take 1 capsule (500 mg total) by mouth 2 (two) times daily for 7 days. Started by: Antony Blackbird,  MD   hydrochlorothiazide 12.5 MG capsule Commonly known as: MICROZIDE Take 1 capsule (12.5 mg total) by mouth daily.   hydrocortisone 25 MG suppository Commonly known as: ANUSOL-HC Place 1 suppository (25 mg total) rectally 2 (two) times daily as needed (Rectal burning).   isosorbide mononitrate 30 MG 24 hr tablet Commonly known as: IMDUR Take 1 tablet (30 mg total) by mouth daily. Take 1/2 tablet by mouth daily   lisinopril 5 MG tablet Commonly known as: ZESTRIL Take 1 tablet (5 mg total) by mouth daily.   metFORMIN 500 MG tablet Commonly known as: GLUCOPHAGE TAKE 2 TABLETS (1,000 MG TOTAL) BY MOUTH 2 (TWO) TIMES DAILY WITH A MEAL.   nitrofurantoin (macrocrystal-monohydrate) 100 MG capsule Commonly known as: MACROBID Take 1 capsule (100 mg total) by mouth 2 (two) times daily.   sulfamethoxazole-trimethoprim 800-160 MG tablet Commonly known as: BACTRIM DS Take 1 tablet by mouth 2 (two) times daily.       Follow-up: Return in about 4 months (around 05/23/2019) for DM/HTN and as needed .   Cain Saupeammie Odai Wimmer, MD

## 2019-01-20 NOTE — Patient Instructions (Signed)
La diabetes mellitus y el cuidado de los pies Diabetes Mellitus and Foot Care El cuidado de los pies es un aspecto importante de la salud, especialmente si tiene diabetes. La diabetes puede generar problemas debido a que el flujo sanguneo (circulacin) es deficiente en las piernas y los pies, y esto puede hacer que la piel:  Se torne ms fina y seca.  Se resquebraje ms fcilmente.  Cicatrice ms lentamente.  Se descame y agriete. Tambin pueden estar daados los nervios (neuropata) de las piernas y de los pies, lo que provoca una disminucin de la sensibilidad. En consecuencia, es posible que no advierta heridas pequeas en los pies que pueden causar problemas ms graves. Identificar y tratar cualquier complicacin lo antes posible es la mejor manera de evitar futuros problemas de pie. Cmo cuidar los pies Higiene de los pies  Lvese los pies todos los das con agua tibia y un jabn suave. No use agua caliente. Luego squese los pies y entre los dedos dando palmaditas, hasta que estn completamente secos. No remoje los pies, ya que esto puede resecar la piel.  Crtese las uas de los pies en lnea recta. No escarbe debajo de las uas o alrededor de las cutculas. Lime los bordes de las uas con una lima o esmeril.  Aplique una locin hidratante o vaselina en la piel de los pies y en las uas secas y quebradizas. Use una locin que no contenga alcohol ni fragancias. No aplique locin entre los dedos. Zapatos y calcetines  Use calcetines de algodn o medias limpias todos los das. Asegrese de que no le ajusten demasiado. No use calcetines que le lleguen a las rodillas, ya que podran disminuir el flujo de sangre a las piernas.  Use zapatos de cuero que le queden bien y que sean acolchados. Revise siempre los zapatos antes de ponerlos para asegurarse de que no haya objetos en su interior.  Para amoldar los zapatos, clcelos solo algunas horas por da. Esto evitar lesiones en los pies.  Heridas, rasguos, durezas y callosidades  Controle sus pies diariamente para observar si hay ampollas, cortes, moretones, llagas o enrojecimiento. Si no puede ver la planta del pie, use un espejo o pdale ayuda a otra persona.  No corte las durezas o callosidades, ni trate de quitarlas con medicamentos.  Si algo le ha raspado, cortado o lastimado la piel de los pies, mantenga la piel de esa zona limpia y seca. Puede higienizar estas zonas con agua y un jabn suave. No limpie la zona con agua oxigenada, alcohol ni yodo.  Si tiene una herida, un rasguo, una dureza o una callosidad en el pie, revsela varias veces al da para asegurarse de que se est curando y no se infecte. Est atento a los siguientes signos: ? Dolor, hinchazn o enrojecimiento. ? Lquido o sangre. ? Calor. ? Pus o mal olor. Instrucciones generales  No se cruce de piernas. Esto puede disminuir el flujo de sangre a los pies.  No use bolsas de agua caliente ni almohadillas trmicas en los pies. Podran causar quemaduras. Si ha perdido la sensibilidad en los pies o las piernas, no sabr lo que le est sucediendo hasta que sea demasiado tarde.  Proteja sus pies del calor y del fro con calzado, en la playa o sobre el pavimento caliente.  Programe una cita para un examen completo de los pies por lo menos una vez al ao (anualmente) o con ms frecuencia si tiene problemas en los pies. Si tiene problemas en los   pies, infrmele al mdico de inmediato sobre los cortes, las llagas o los moretones. Comunquese con un mdico si:  Tiene una afeccin que aumenta su riesgo de tener infecciones y tiene cortes, llagas o moretones en los pies.  Tiene una lesin que no se cura.  Tiene una zona irritada en las piernas o los pies.  Siente una sensacin de ardor u hormigueo en las piernas o los pies.  Siente dolor o calambres en las piernas o los pies.  Las piernas o los pies estn adormecidos.  Siente los pies siempre fros.   Siente dolor alrededor de una ua del pie. Solicite ayuda de inmediato si:  Tiene una herida, un rasguo, una dureza o una callosidad en el pie y: ? Tiene dolor, hinchazn o enrojecimiento que empeora. ? Le sale lquido o sangre de la herida, el rasguo, la dureza o la callosidad. ? La herida, el rasguo, la dureza o la callosidad est caliente al tacto. ? Le sale pus o mal olor de la herida, el rasguo, la dureza o la callosidad. ? Tiene fiebre. ? Tiene una lnea roja que sube por la pierna. Resumen  Controle todos los das el estado de sus pies para observar si hay cortes, llagas, manchas rojas, hinchazn o ampollas.  Humctese los pies y las piernas a diario.  Use zapatos de cuero que le queden bien y que sean acolchados.  Si tiene problemas en los pies, infrmele al mdico de inmediato sobre los cortes, las llagas o los moretones.  Programe una cita para un examen completo de los pies por lo menos una vez al ao (anualmente) o con ms frecuencia si tiene problemas en los pies. Esta informacin no tiene como fin reemplazar el consejo del mdico. Asegrese de hacerle al mdico cualquier pregunta que tenga. Document Released: 06/30/2005 Document Revised: 02/20/2017 Document Reviewed: 02/20/2017 Elsevier Patient Education  2020 [employer]  

## 2019-01-20 NOTE — Progress Notes (Signed)
Follow up for tingling in feet and hands

## 2019-01-21 LAB — COMPREHENSIVE METABOLIC PANEL WITH GFR
ALT: 20 IU/L (ref 0–32)
AST: 32 IU/L (ref 0–40)
Albumin/Globulin Ratio: 1.5 (ref 1.2–2.2)
Albumin: 4.5 g/dL (ref 3.8–4.8)
Alkaline Phosphatase: 95 IU/L (ref 39–117)
BUN/Creatinine Ratio: 20 (ref 12–28)
BUN: 14 mg/dL (ref 8–27)
Bilirubin Total: 0.3 mg/dL (ref 0.0–1.2)
CO2: 23 mmol/L (ref 20–29)
Calcium: 9.7 mg/dL (ref 8.7–10.3)
Chloride: 100 mmol/L (ref 96–106)
Creatinine, Ser: 0.7 mg/dL (ref 0.57–1.00)
GFR calc Af Amer: 102 mL/min/1.73
GFR calc non Af Amer: 88 mL/min/1.73
Globulin, Total: 3.1 g/dL (ref 1.5–4.5)
Glucose: 87 mg/dL (ref 65–99)
Potassium: 4.1 mmol/L (ref 3.5–5.2)
Sodium: 140 mmol/L (ref 134–144)
Total Protein: 7.6 g/dL (ref 6.0–8.5)

## 2019-01-21 LAB — MICROALBUMIN / CREATININE URINE RATIO
Creatinine, Urine: 62.2 mg/dL
Microalb/Creat Ratio: 10 mg/g{creat} (ref 0–29)
Microalbumin, Urine: 6.3 ug/mL

## 2019-01-21 LAB — POCT URINALYSIS DIP (CLINITEK)
Bilirubin, UA: NEGATIVE
Blood, UA: NEGATIVE
Glucose, UA: NEGATIVE mg/dL
Ketones, POC UA: NEGATIVE mg/dL
Nitrite, UA: NEGATIVE
POC PROTEIN,UA: NEGATIVE
Spec Grav, UA: 1.015
Urobilinogen, UA: 1 U/dL
pH, UA: 6

## 2019-01-21 LAB — VITAMIN B12: Vitamin B-12: 328 pg/mL (ref 232–1245)

## 2019-01-22 LAB — URINE CULTURE

## 2019-01-24 ENCOUNTER — Telehealth: Payer: Self-pay | Admitting: Emergency Medicine

## 2019-01-24 NOTE — Telephone Encounter (Signed)
Nurse called the patient's home phone number but received no answer and message was left on the voicemail for the patient to call back.  Return phone number given. 

## 2019-01-25 ENCOUNTER — Telehealth: Payer: Self-pay | Admitting: Family Medicine

## 2019-01-25 NOTE — Telephone Encounter (Signed)
Patient called back for her results. Please follow up.

## 2019-01-26 NOTE — Telephone Encounter (Signed)
Patient contacted via phone to be given results of labs.  Patient identified by name and date of birth.  Patient given results of labs.  Patient educated on lab results. Questions answered. Patient acknowledged understanding of labs results.  Patient advised that Urine results had not come back but when they did she would be contacted.

## 2019-02-01 ENCOUNTER — Telehealth: Payer: Self-pay | Admitting: Emergency Medicine

## 2019-02-01 NOTE — Telephone Encounter (Signed)
Nurse called the patient's home and cell phone number but received no answer and message was left on the voicemail for the patient to call back.  Return phone number given.

## 2019-02-02 ENCOUNTER — Telehealth: Payer: Self-pay | Admitting: Family Medicine

## 2019-02-02 NOTE — Telephone Encounter (Signed)
Patient called wanting to speak about her results please follow up

## 2019-02-03 NOTE — Progress Notes (Signed)
CMA spoke to patient and inform on lab results with PCP advising.  Pt. Understood. Pt. Verified DOB.  [place] interpreter assist with the call.

## 2019-02-03 NOTE — Telephone Encounter (Signed)
CMA spoke to patient and inform on lab results with PCP advising.  Pt. Understood. Pt. Verified DOB.  [employer] [place] interpreter assist with the call.  

## 2019-02-22 ENCOUNTER — Ambulatory Visit
Admission: RE | Admit: 2019-02-22 | Discharge: 2019-02-22 | Disposition: A | Discharge status: Home / Self Care | Payer: Medicare Other | Source: Ambulatory Visit | Attending: Family Medicine | Admitting: Family Medicine

## 2019-02-22 ENCOUNTER — Other Ambulatory Visit: Payer: Self-pay

## 2019-02-22 DIAGNOSIS — Z1231 Encounter for screening mammogram for malignant neoplasm of breast: Secondary | ICD-10-CM

## 2019-03-08 ENCOUNTER — Other Ambulatory Visit: Payer: Self-pay | Admitting: Physician Assistant

## 2019-03-08 DIAGNOSIS — I10 Essential (primary) hypertension: Secondary | ICD-10-CM

## 2019-03-08 DIAGNOSIS — E119 Type 2 diabetes mellitus without complications: Secondary | ICD-10-CM

## 2019-04-14 ENCOUNTER — Other Ambulatory Visit: Payer: Self-pay | Admitting: Family Medicine

## 2019-04-14 DIAGNOSIS — E119 Type 2 diabetes mellitus without complications: Secondary | ICD-10-CM

## 2019-05-23 ENCOUNTER — Ambulatory Visit: Payer: Medicare Other | Admitting: Family Medicine

## 2019-07-02 ENCOUNTER — Other Ambulatory Visit: Payer: Self-pay | Admitting: Physician Assistant

## 2019-07-02 DIAGNOSIS — I10 Essential (primary) hypertension: Secondary | ICD-10-CM

## 2019-07-15 ENCOUNTER — Other Ambulatory Visit: Payer: Self-pay | Admitting: Family Medicine

## 2019-07-15 DIAGNOSIS — E119 Type 2 diabetes mellitus without complications: Secondary | ICD-10-CM

## 2019-07-28 ENCOUNTER — Other Ambulatory Visit: Payer: Self-pay | Admitting: Family Medicine

## 2019-07-28 DIAGNOSIS — I10 Essential (primary) hypertension: Secondary | ICD-10-CM

## 2019-08-05 ENCOUNTER — Ambulatory Visit: Payer: Medicare Other | Attending: Family Medicine | Admitting: Family Medicine

## 2019-08-05 ENCOUNTER — Encounter: Payer: Self-pay | Admitting: Family Medicine

## 2019-08-05 ENCOUNTER — Other Ambulatory Visit: Payer: Self-pay

## 2019-08-05 VITALS — BP 128/83 | HR 80 | Ht <= 58 in | Wt 161.0 lb

## 2019-08-05 DIAGNOSIS — Z79899 Other long term (current) drug therapy: Secondary | ICD-10-CM

## 2019-08-05 DIAGNOSIS — B351 Tinea unguium: Secondary | ICD-10-CM | POA: Diagnosis not present

## 2019-08-05 DIAGNOSIS — Z7982 Long term (current) use of aspirin: Secondary | ICD-10-CM | POA: Diagnosis not present

## 2019-08-05 DIAGNOSIS — M546 Pain in thoracic spine: Secondary | ICD-10-CM | POA: Insufficient documentation

## 2019-08-05 DIAGNOSIS — Z603 Acculturation difficulty: Secondary | ICD-10-CM

## 2019-08-05 DIAGNOSIS — N644 Mastodynia: Secondary | ICD-10-CM

## 2019-08-05 DIAGNOSIS — M79674 Pain in right toe(s): Secondary | ICD-10-CM | POA: Diagnosis not present

## 2019-08-05 DIAGNOSIS — M79675 Pain in left toe(s): Secondary | ICD-10-CM | POA: Diagnosis not present

## 2019-08-05 DIAGNOSIS — Z7901 Long term (current) use of anticoagulants: Secondary | ICD-10-CM | POA: Insufficient documentation

## 2019-08-05 DIAGNOSIS — E119 Type 2 diabetes mellitus without complications: Secondary | ICD-10-CM | POA: Diagnosis present

## 2019-08-05 DIAGNOSIS — Z7984 Long term (current) use of oral hypoglycemic drugs: Secondary | ICD-10-CM | POA: Insufficient documentation

## 2019-08-05 DIAGNOSIS — M8580 Other specified disorders of bone density and structure, unspecified site: Secondary | ICD-10-CM | POA: Diagnosis not present

## 2019-08-05 DIAGNOSIS — R0789 Other chest pain: Secondary | ICD-10-CM | POA: Diagnosis not present

## 2019-08-05 DIAGNOSIS — M549 Dorsalgia, unspecified: Secondary | ICD-10-CM | POA: Diagnosis not present

## 2019-08-05 DIAGNOSIS — Z780 Asymptomatic menopausal state: Secondary | ICD-10-CM

## 2019-08-05 DIAGNOSIS — E785 Hyperlipidemia, unspecified: Secondary | ICD-10-CM

## 2019-08-05 DIAGNOSIS — I10 Essential (primary) hypertension: Secondary | ICD-10-CM | POA: Diagnosis not present

## 2019-08-05 DIAGNOSIS — Z789 Other specified health status: Secondary | ICD-10-CM

## 2019-08-05 DIAGNOSIS — M419 Scoliosis, unspecified: Secondary | ICD-10-CM

## 2019-08-05 DIAGNOSIS — Z23 Encounter for immunization: Secondary | ICD-10-CM | POA: Diagnosis not present

## 2019-08-05 DIAGNOSIS — Z758 Other problems related to medical facilities and other health care: Secondary | ICD-10-CM

## 2019-08-05 DIAGNOSIS — Z833 Family history of diabetes mellitus: Secondary | ICD-10-CM | POA: Insufficient documentation

## 2019-08-05 LAB — POCT GLYCOSYLATED HEMOGLOBIN (HGB A1C): HbA1c, POC (controlled diabetic range): 5.9 % (ref 0.0–7.0)

## 2019-08-05 LAB — GLUCOSE, POCT (MANUAL RESULT ENTRY): POC Glucose: 97 mg/dL (ref 70–99)

## 2019-08-05 NOTE — Progress Notes (Signed)
Established Patient Office Visit  Subjective:  Patient ID: Emily Hamilton, female    DOB: [birth year]  Age: 74 y.o. MRN: [id number]  CC:  Chief Complaint  Patient presents with  . Diabetes   Due to language barrier, Stratus video interpretation system used at today's visit  HPI Emily Hamilton, 74 year old female, who was last seen in the office on 01/20/2019 in follow-up of her chronic medical issues including type 2 diabetes which has been well controlled with hemoglobin A1c of 6.4 in February 2020, hyperlipidemia, osteopenia and hypertension.  She reports that her blood sugars remain well controlled and she denies any current issues with increased thirst, no blurred vision and no frequent urination.  She continues to take her blood pressure medication daily and has had no headaches or dizziness related to her blood pressure.  She denies any cough with the use of lisinopril.  She continues to take atorvastatin for hyperlipidemia and she does not feel as if she has had any increased muscle or joint pain related to the use of this medication.  She does report that she is having pain in her toes/toenails which she believes is related to fungal infection.  She has increased discomfort when wearing shoes.  She also reports that he she has had some recent mid back pain which she has at about the level of her bra strap in the middle of her back.  She denies any injury or activity which preceded onset of back pain.  Back pain can range from about a 4-6 on a 0-to-10 scale but is better with use of over-the-counter pain medication.  She also reports some occasional discomfort in the left breast-dull, aching sensation.  She has also had occasional sharp, shooting episodes of left-sided chest pain which occurs briefly and is not accompanied by any shortness of breath, nausea, sweating and does not cause radiation of pain to the neck, arm or jaw.  Past Medical History:  Diagnosis Date  .  Controlled type 2 diabetes mellitus (HCC)   . Family history of diabetes mellitus   . Hyperlipidemia   . Osteopenia   . Status post right knee replacement     Past Surgical History:  Procedure Laterality Date  . BREAST EXCISIONAL BIOPSY Left   . CHOLECYSTECTOMY    . Right knee replacement      Family History  Problem Relation Age of Onset  . Diabetes Mother   . Glaucoma Mother     Social History   Socioeconomic History  . Marital status: Married    Spouse name: Not on file  . Number of children: Not on file  . Years of education: Not on file  . Highest education level: Not on file  Occupational History  . Not on file  Tobacco Use  . Smoking status: Never Smoker  . Smokeless tobacco: Never Used  Substance and Sexual Activity  . Alcohol use: Never  . Drug use: Never  . Sexual activity: Never  Other Topics Concern  . Not on file  Social History Narrative  . Not on file   Social Determinants of Health   Financial Resource Strain:   . Difficulty of Paying Living Expenses:   Food Insecurity:   . Worried About Programme researcher, broadcasting/film/video in the Last Year:   . Barista in the Last Year:   Transportation Needs:   . Freight forwarder (Medical):   Marland Kitchen Lack of Transportation (Non-Medical):   Physical Activity:   .  Days of Exercise per Week:   . Minutes of Exercise per Session:   Stress:   . Feeling of Stress :   Social Connections:   . Frequency of Communication with Friends and Family:   . Frequency of Social Gatherings with Friends and Family:   . Attends Religious Services:   . Active Member of Clubs or Organizations:   . Attends Archivist Meetings:   Marland Kitchen Marital Status:   Intimate Partner Violence:   . Fear of Current or Ex-Partner:   . Emotionally Abused:   Marland Kitchen Physically Abused:   . Sexually Abused:     Outpatient Medications Prior to Visit  Medication Sig Dispense Refill  . isosorbide mononitrate (IMDUR) 30 MG 24 hr tablet Take 1 tablet (30  mg total) by mouth daily. Take 1/2 tablet by mouth daily 30 tablet 3  . lisinopril (ZESTRIL) 5 MG tablet Take 1 tablet (5 mg total) by mouth daily. Must have office visit for refills 30 tablet 0  . metFORMIN (GLUCOPHAGE) 500 MG tablet TAKE 2 TABLETS (1,000 MG TOTAL) BY MOUTH 2 (TWO) TIMES DAILY WITH A MEAL. 360 tablet 0  . aspirin EC 81 MG tablet Take 1 tablet (81 mg total) by mouth daily. 90 tablet 3  . atorvastatin (LIPITOR) 40 MG tablet TOME UNA TABLETA TODOS LOS DIAS 90 tablet 1  . hydrochlorothiazide (MICROZIDE) 12.5 MG capsule TOME UNA CAPSULA TODOS LOS DIAS 90 capsule 1  . hydrocortisone (ANUSOL-HC) 25 MG suppository Place 1 suppository (25 mg total) rectally 2 (two) times daily as needed (Rectal burning). (Patient not taking: Reported on 09/08/2019) 12 suppository 0   No facility-administered medications prior to visit.    No Known Allergies  ROS Review of Systems  Constitutional: Negative for chills, fatigue and fever.  HENT: Negative for nosebleeds, sore throat and trouble swallowing.   Eyes: Negative for photophobia and visual disturbance.  Respiratory: Negative for cough and shortness of breath.   Cardiovascular: Positive for chest pain. Negative for palpitations and leg swelling.  Gastrointestinal: Negative for abdominal pain, blood in stool, constipation, diarrhea and nausea.  Endocrine: Negative for polydipsia, polyphagia and polyuria.  Genitourinary: Negative for dysuria, frequency and hematuria.  Musculoskeletal: Positive for arthralgias and back pain.  Skin: Negative for rash and wound.  Neurological: Negative for dizziness and headaches.  Hematological: Negative for adenopathy. Does not bruise/bleed easily.      Objective:    Physical Exam  Constitutional: She is oriented to person, place, and time. She appears well-developed and well-nourished.  Well-nourished well-developed elderly female in no acute distress.  She is wearing a facemask as per office COVID-19  protocol  Neck: No JVD present. No thyromegaly present.  Cardiovascular: Normal rate, regular rhythm and intact distal pulses.  Pulmonary/Chest: Effort normal and breath sounds normal.  Abdominal: Soft. There is no abdominal tenderness. There is no rebound and no guarding.  Musculoskeletal:        General: Tenderness and deformity present. No edema.     Cervical back: Normal range of motion and neck supple.     Comments: Patient with kyphoscoliosis on exam and some tenderness to palpation over the thoracic spine around T5-6.  Lymphadenopathy:    She has no cervical adenopathy.  Neurological: She is alert and oriented to person, place, and time.  Skin:  No active skin breakdown on the feet.  Patient with onychomycosis of the toenails  Psychiatric: She has a normal mood and affect. Her behavior is normal.  Nursing note  and vitals reviewed.   BP 128/83   Pulse 80   Ht 4\' 8"  (1.422 m)   Wt 161 lb (73 kg)   SpO2 99%   BMI 36.10 kg/m  Wt Readings from Last 3 Encounters:  09/08/19 163 lb (73.9 kg)  09/05/19 163 lb (73.9 kg)  08/18/19 163 lb 12.8 oz (74.3 kg)     Health Maintenance Due  Topic Date Due  . Hepatitis C Screening  Never done  . OPHTHALMOLOGY EXAM  Never done  . TETANUS/TDAP  Never done  . DEXA SCAN  Never done  . PNA vac Low Risk Adult (1 of 2 - PCV13) Never done      No results found for: TSH Lab Results  Component Value Date   WBC 7.3 08/05/2019   HGB 12.9 08/05/2019   HCT 39.1 08/05/2019   MCV 88 08/05/2019   PLT 191 08/05/2019   Lab Results  Component Value Date   NA 140 08/05/2019   K 4.1 08/05/2019   CO2 26 08/05/2019   GLUCOSE 98 08/05/2019   BUN 9 08/05/2019   CREATININE 0.67 08/05/2019   BILITOT 0.3 08/05/2019   ALKPHOS 94 08/05/2019   AST 23 08/05/2019   ALT 16 08/05/2019   PROT 7.3 08/05/2019   ALBUMIN 4.4 08/05/2019   CALCIUM 9.4 08/05/2019   Lab Results  Component Value Date   CHOL 155 08/05/2019   Lab Results  Component Value  Date   HDL 53 08/05/2019   Lab Results  Component Value Date   LDLCALC 69 08/05/2019   Lab Results  Component Value Date   TRIG 200 (H) 08/05/2019   Lab Results  Component Value Date   CHOLHDL 2.9 08/05/2019   Lab Results  Component Value Date   HGBA1C 5.9 08/05/2019      Assessment & Plan:  1. Type 2 diabetes mellitus without complication, without long-term current use of insulin (HCC) Patient with history of well-controlled type 2 diabetes with last hemoglobin A1c of 6.4 on 09/08/2018.  At today's visit, patient with normal glucose of 97 and hemoglobin A1c of 5.9.  She denies any significant hypoglycemic episodes.  She is to continue Metformin along with healthy, low carbohydrate diet.  She is aware that her dose of Metformin can be decreased if she has issues with hypoglycemia or any issues with intolerance to the medication.  She will also have blood work at today's visit including comprehensive metabolic panel and lipid panel.  She is also being referred to cardiology as she has had some recent issues with chest pressure and because she is diabetic, she may not have the usual more severe chest pain associated with heart disease and additionally diabetes can increase her risk of heart disease. - POCT glucose (manual entry) - POCT glycosylated hemoglobin (Hb A1C) - Ambulatory referral to Cardiology - Comprehensive metabolic panel - Lipid Panel  2. Chest pressure She has had recent complaint of chest pressure for which she will be referred to cardiology for further evaluation.  Discussed with the patient that being diabetic can increase the risk of heart disease and can also affect presentation of symptoms and that women can also have atypical presentation of symptoms related to heart disease. - Ambulatory referral to Cardiology  3. Breast pain, left Patient with complaint of some left-sided breast pain in addition to chest pressure.  She has been referred to cardiology in  follow-up of chest pressure and will be scheduled for diagnostic mammogram of the left breast  in follow-up of her left breast pain. - MM DIAG BREAST TOMO UNI LEFT; Future  4. Pain due to onychomycosis of toenails of both feet She reports issues with painful toenails due to fungal infection for which she will be referred to podiatry for further evaluation and treatment. - Ambulatory referral to Podiatry  5. Acute mid back pain; 7.  Osteopenia; 8.  Kyphoscoliosis She reports acute mid back pain and denies any urinary symptoms.  She does have history of osteopenia and kyphoscoliosis.  She will be referred for bone density scan and is encouraged to start an over-the-counter calcium and vitamin D supplement and to continue weightbearing exercise such as walking when she is cleared by cardiology.  If she has worsening of back pain, she is encouraged to have x-ray done but she declines order for x-ray at this time.  6. Hyperlipidemia LDL goal <70 She is currently on atorvastatin and will have repeat lipid panel as well as comprehensive metabolic panel in follow-up of statin therapy.  She is encouraged to continue healthy, low-fat/low carbohydrate diet. - Comprehensive metabolic panel - Lipid Panel  9. Long-term use of aspirin therapy She is currently on long-term anticoagulant therapy, daily 81 mg aspirin therapy, for which she will have CBC to look for any anemia or platelet abnormalities associated with her use of anticoagulant medication.  She denies any current issues with abnormal bruising or bleeding. - CBC  10. Encounter for long-term current use of medication She will have comprehensive metabolic panel at today's visit and follow-up of long-term use of medication for the treatment of diabetes and hyperlipidemia  11. Asymptomatic postmenopausal estrogen deficiency Patient with postmenopausal estrogen deficiency as well as kyphoscoliosis and history of osteopenia for which she will be scheduled  for a bone density scan. - DG BONE DENSITY (DXA); Future  12. Language barrier Stratus video interpretation system used at today's visit to help with language barrier to communication of healthcare information  13.  Essential hypertension Blood pressure stable and she will continue the use of lisinopril and hydrochlorothiazide.   An After Visit Summary was printed and given to the patient.   Follow-up: Return in about 4 weeks (around 09/02/2019) for chest pain/breast pain.    Cain Saupe, MD

## 2019-08-05 NOTE — Progress Notes (Signed)
Pain back x 2 weeks. From the shoulders down.

## 2019-08-06 LAB — COMPREHENSIVE METABOLIC PANEL WITH GFR
ALT: 16 IU/L (ref 0–32)
AST: 23 IU/L (ref 0–40)
Albumin/Globulin Ratio: 1.5 (ref 1.2–2.2)
Albumin: 4.4 g/dL (ref 3.7–4.7)
Alkaline Phosphatase: 94 IU/L (ref 39–117)
BUN/Creatinine Ratio: 13 (ref 12–28)
BUN: 9 mg/dL (ref 8–27)
Bilirubin Total: 0.3 mg/dL (ref 0.0–1.2)
CO2: 26 mmol/L (ref 20–29)
Calcium: 9.4 mg/dL (ref 8.7–10.3)
Chloride: 102 mmol/L (ref 96–106)
Creatinine, Ser: 0.67 mg/dL (ref 0.57–1.00)
GFR calc Af Amer: 102 mL/min/1.73
GFR calc non Af Amer: 89 mL/min/1.73
Globulin, Total: 2.9 g/dL (ref 1.5–4.5)
Glucose: 98 mg/dL (ref 65–99)
Potassium: 4.1 mmol/L (ref 3.5–5.2)
Sodium: 140 mmol/L (ref 134–144)
Total Protein: 7.3 g/dL (ref 6.0–8.5)

## 2019-08-06 LAB — CBC
Hematocrit: 39.1 % (ref 34.0–46.6)
Hemoglobin: 12.9 g/dL (ref 11.1–15.9)
MCH: 29 pg (ref 26.6–33.0)
MCHC: 33 g/dL (ref 31.5–35.7)
MCV: 88 fL (ref 79–97)
Platelets: 191 x10E3/uL (ref 150–450)
RBC: 4.45 x10E6/uL (ref 3.77–5.28)
RDW: 13.5 % (ref 11.7–15.4)
WBC: 7.3 x10E3/uL (ref 3.4–10.8)

## 2019-08-06 LAB — LIPID PANEL
Chol/HDL Ratio: 2.9 ratio (ref 0.0–4.4)
Cholesterol, Total: 155 mg/dL (ref 100–199)
HDL: 53 mg/dL
LDL Chol Calc (NIH): 69 mg/dL (ref 0–99)
Triglycerides: 200 mg/dL — ABNORMAL HIGH (ref 0–149)
VLDL Cholesterol Cal: 33 mg/dL (ref 5–40)

## 2019-08-10 ENCOUNTER — Encounter: Payer: Self-pay | Admitting: Family Medicine

## 2019-08-10 ENCOUNTER — Other Ambulatory Visit: Payer: Self-pay

## 2019-08-10 ENCOUNTER — Ambulatory Visit (INDEPENDENT_AMBULATORY_CARE_PROVIDER_SITE_OTHER): Payer: Medicare Other | Admitting: Podiatry

## 2019-08-10 DIAGNOSIS — L603 Nail dystrophy: Secondary | ICD-10-CM

## 2019-08-10 DIAGNOSIS — B351 Tinea unguium: Secondary | ICD-10-CM | POA: Diagnosis not present

## 2019-08-10 DIAGNOSIS — M79676 Pain in unspecified toe(s): Secondary | ICD-10-CM | POA: Diagnosis not present

## 2019-08-13 NOTE — Progress Notes (Signed)
   Subjective: 74 y.o. female with PMHx of T2DM presenting today as a new patient with a chief complaint of painful great toenails of the bilateral feet that became symptomatic about one year ago. Applying pressure to the nails increases the pain. She has not had any treatment for the symptoms. Patient is here for further evaluation and treatment.   Past Medical History:  Diagnosis Date  . Controlled type 2 diabetes mellitus (HCC)   . Family history of diabetes mellitus   . Hyperlipidemia   . Osteopenia   . Status post right knee replacement     Objective: Physical Exam General: The patient is alert and oriented x3 in no acute distress.  Dermatology: Hyperkeratotic, discolored, thickened, onychodystrophy noted to the bilateral great toenails. Skin is warm, dry and supple bilateral lower extremities. Negative for open lesions or macerations.  Vascular: Palpable pedal pulses bilaterally. No edema or erythema noted. Capillary refill within normal limits.  Neurological: Epicritic and protective threshold grossly intact bilaterally.   Musculoskeletal Exam: Range of motion within normal limits to all pedal and ankle joints bilateral. Muscle strength 5/5 in all groups bilateral.   Assessment: #1 Dystrophic nails bilateral great toes  Plan of Care:  #1 Patient was evaluated. #2 Mechanical debridement of great toenails bilaterally performed using a nail nipper. Filed with dremel without incident.  #3 Return to clinic as needed.    Felecia Shelling, DPM [facility]  Dr. Felecia Shelling, DPM    [address]                                        [place], [place] [address]                Office [phone]  Fax [phone]

## 2019-08-16 ENCOUNTER — Other Ambulatory Visit: Payer: Self-pay | Admitting: Family Medicine

## 2019-08-16 DIAGNOSIS — N644 Mastodynia: Secondary | ICD-10-CM

## 2019-08-18 ENCOUNTER — Encounter: Payer: Self-pay | Admitting: Internal Medicine

## 2019-08-18 ENCOUNTER — Other Ambulatory Visit: Payer: Self-pay

## 2019-08-18 ENCOUNTER — Ambulatory Visit (INDEPENDENT_AMBULATORY_CARE_PROVIDER_SITE_OTHER): Payer: Medicare Other | Admitting: Internal Medicine

## 2019-08-18 VITALS — BP 132/67 | HR 87 | Temp 97.9°F | Ht <= 58 in | Wt 163.8 lb

## 2019-08-18 DIAGNOSIS — I10 Essential (primary) hypertension: Secondary | ICD-10-CM

## 2019-08-18 DIAGNOSIS — I4891 Unspecified atrial fibrillation: Secondary | ICD-10-CM | POA: Diagnosis not present

## 2019-08-18 DIAGNOSIS — Z7901 Long term (current) use of anticoagulants: Secondary | ICD-10-CM

## 2019-08-18 DIAGNOSIS — E785 Hyperlipidemia, unspecified: Secondary | ICD-10-CM | POA: Diagnosis not present

## 2019-08-18 DIAGNOSIS — R072 Precordial pain: Secondary | ICD-10-CM

## 2019-08-18 DIAGNOSIS — E119 Type 2 diabetes mellitus without complications: Secondary | ICD-10-CM | POA: Diagnosis not present

## 2019-08-18 MED ORDER — APIXABAN 5 MG PO TABS: 5.0000 mg | ORAL_TABLET | Freq: Two times a day (BID) | ORAL | 6 refills | Status: DC

## 2019-08-18 NOTE — Patient Instructions (Addendum)
Medication Instructions:  Start taking  Medication Eliquis 5 mg  Take one tablet twice a day    continue all other medications  *If you need a refill on your cardiac medications before your next appointment, please call your pharmacy*  Lab Work:  Not needed  Testing/Procedures: WILL BE SCHEDULE AT [address] Your physician has requested that you have an echocardiogram. Echocardiography is a painless test that uses sound waves to create images of your heart. It provides your doctor with information about the size and shape of your heart and how well your heart's chambers and valves are working. This procedure takes approximately one hour. There are no restrictions for this procedure.  Ecocardiograma Echocardiogram Un ecocardiograma es un procedimiento que [place] ondas sonoras indoloras (ultrasonido) para obtener una imagen del corazn. Las imgenes de un ecocardiograma pueden proporcionar informacin importante sobre lo siguiente:  Signos de arteriopata coronaria (EAC).  Signos de aneurisma. Un aneurisma es una zona debilitada o daada de la pared de una arteria que se abulta por la fuerza normal del bombeo de la sangre a travs del cuerpo.  Tamao y forma del corazn. Los [employer] tamao y la forma del corazn se pueden asociar a determinadas afecciones, como insuficiencia cardaca, aneurisma y Saxis.  Funcin del msculo cardaco.  Funcin de la vlvula cardaca.  Signos de un infarto de miocardio previo.  Acumulacin de lquido alrededor del corazn.  Engrosamiento del msculo cardaco.  Un tumor o crecimiento infeccioso alrededor de las vlvulas cardacas. Informe al mdico acerca de lo siguiente:  Cualquier alergia que tenga.  Todos los [employer] consume, incluidos vitaminas, hierbas, gotas oftlmicas, cremas y [address] de [address].  Cualquier enfermedad de la sangre que tenga.  Cirugas a las que se someti.  Cualquier  enfermedad que tenga.  Si est embarazada o podra estarlo. Cules son los riesgos? En general, se trata de un procedimiento seguro. Sin embargo, pueden ocurrir complicaciones, por ejemplo:  Reaccin alrgica al tinte de contraste utilizado en el procedimiento. Qu ocurre antes del procedimiento? No se requiere Lobbyist. Podr comer y beber normalmente. Qu ocurre durante el procedimiento?   Pueden colocarle un tubo (catter) intravenoso en una de las venas.  Puede recibir Arts development officer a travs de esta va. Un contraste es una inyeccin que mejora la calidad de las imgenes del corazn.  Le aplicarn un gel sobre el pecho.  Le pasarn un instrumento similar a una vara (transductor) sobre el pecho. El gel ayudar a transmitir las [employer] del transductor.  Las ondas sonoras rebotarn inofensivamente en el corazn para permitir capturar las imgenes del corazn en movimiento, en tiempo real. Las imgenes se registrarn en una computadora. Este procedimiento puede variar segn el mdico y el hospital. Ladell Heads sucede despus del procedimiento?  Puede retomar su rutina diaria normal, incluidos la dieta, las actividades y [employer], a menos que su mdico le indique lo contrario. Resumen  Un ecocardiograma es un procedimiento que [place] ondas sonoras indoloras (ultrasonido) para obtener una imagen del corazn.  Las imgenes de un ecocardiograma pueden brindar informacin importante sobre el tamao y la forma del corazn, la funcin del msculo cardaco, la funcin de la vlvula cardaca y la acumulacin de lquido alrededor del Programmer, multimedia.  No es necesario prepararse para este procedimiento. Podr comer y beber normalmente.  Al finalizar el ecocardiograma, puede retomar su rutina diaria normal, a menos que su mdico le indique lo contrario. Esta informacin no tiene como fin  reemplazar el consejo del mdico. Asegrese de hacerle al mdico cualquier pregunta que  tenga. Document Revised: 04/09/2017 Document Reviewed: 10/24/2016 Elsevier Patient Education  2020 [place] Cardiac Nuclear Scan Una gammagrafa cardaca es una prueba que se realiza para verificar el flujo de sangre hacia el corazn. Se realiza cuando est en reposo y cuando hace ejercicio. La prueba puede detectar si:  No llega suficiente sangre a una regin del corazn.  El msculo cardaco no funciona como debera. Puede ser [employer] le hagan esta prueba si:  Wilma Flavin enfermedad cardaca.  Ha tenido resultados de laboratorio que no son normales.  Ha tenido una ciruga cardaca o un procedimiento de baln para abrir las arterias obstruidas (angioplastia).  Siente dolor en el pecho.  Le falta el aire. Para esta prueba, se pone un tinte especial (marcador) en el torrente sanguneo. El Consulting civil engineer. Luego una cmara tomar imgenes del corazn para ver cmo se Investment banker, operational a travs del corazn. Generalmente, esta prueba se realiza en un hospital y [place] 2 y 4horas. Informe al mdico acerca de:  Cualquier alergia que tenga.  Todos los [employer], incluidos vitaminas, hierbas, gotas oftlmicas, cremas y medicamentos de venta libre.  Cualquier problema previo que usted o algn miembro de su familia haya tenido con los anestsicos.  Cualquier trastorno de la sangre que tenga.  Cirugas a las que se haya sometido.  Cualquier afeccin mdica que tenga.  Si est embarazada o podra estarlo. Cules son los riesgos? Por lo general, se trata de un estudio seguro. Sin embargo, pueden presentarse problemas, por ejemplo:  Dolor intenso en el pecho e infarto de miocardio. Esto es solo un riesgo si se realiza la parte de la prueba de esfuerzo del procedimiento.  Latidos cardacos rpidos.  Una sensacin de Technical brewer. Esta sensacin  por lo general no dura mucho tiempo.  Una reaccin alrgica al [employer]. Qu ocurre antes de esta prueba?  Consulte al mdico si debe cambiar o suspender sus medicamentos habituales. Esto es importante.  Siga las indicaciones del mdico respecto de lo que no puede comer o beber.  Valders de la prueba. Qu ocurre durante la prueba?  Le colocarn un tubo (catter) intravenoso en una de las venas.  El mdico le administrar una pequea cantidad de Geneticist, molecular a travs del tubo (catter) intravenoso.  Usted esperar durante 20 a 85minutos mientras el marcador se desplaza por el torrente sanguneo.  Se supervisar el corazn con un electrocardiograma (ECG).  Se recostar en una camilla.  Se tomarn imgenes del corazn durante alrededor de 15 a 15minutos.  Tambin pueden hacerle Art therapist. Para esta prueba, puede realizarse una de estas cosas: ? Se le pedir que ejercite sobre una cinta caminadora o una bicicleta fija. ? Le administrarn medicamentos que harn que su corazn trabaje ms. Esto se realiza si no puede hacer ejercicio.  Cuando el corazn reciba el flujo mximo de [place], se volver a Environmental consultant a travs del tubo (catter) intravenoso.  Despus de 20 a 40 minutos, regresar a la camilla. Se le tomarn ms imgenes del corazn.  Segn el marcador utilizado, es posible que se deban tomar ms imgenes de 3 a 4 horas ms tarde.  Cuando la prueba haya terminado, se retirar el tubo (catter) intravenoso. El estudio puede variar segn el mdico y [place]  hospital. Ladell Heads sucede despus del estudio?  Pregntele al mdico lo siguiente: ? Si puede retomar su rutina normal, incluidos la [address], las actividades y [employer]. ? Si debe tomar ms lquido. Esto ayudar a Event organiser del cuerpo. Beba suficiente lquido para Radio producer pis (la orina) de color amarillo plido.  Consulte al mdico o pregunte en el departamento donde se realiza  el estudio: ? Cundo estarn disponibles mis resultados? ? Cmo obtendr mis resultados? Resumen  Una gammagrafa cardaca es una prueba que se realiza para verificar el flujo de sangre hacia el corazn.  Informe al mdico si est embarazada o podra estarlo.  Antes de la prueba, consulte al mdico si debe cambiar o suspender sus medicamentos habituales. Esto es importante.  Consulte al mdico si puede volver a sus [employer]. Es posible que le indiquen que beba ms cantidad de lquidos. Esta informacin no tiene Theme park manager el consejo del mdico. Asegrese de hacerle al mdico cualquier pregunta que tenga. Document Revised: 02/08/2018 Document Reviewed: 02/08/2018 Elsevier Patient Education  2020 [employer].   Follow-Up: At [facility], you and your health needs are our priority.  As part of our continuing mission to provide you with exceptional heart care, we have created designated Provider Care Teams.  These Care Teams include your primary Cardiologist (physician) and Advanced Practice Providers (APPs -  Physician Assistants and Nurse Practitioners) who all work together to provide you with the care you need, when you need it.  Your next appointment:  2 to 3  Weeks  (  Week of feb 25 )  The format for your next appointment:   In Person  Provider:   Weston Brass, MD  Other Instructions  n/a

## 2019-08-18 NOTE — Progress Notes (Signed)
Cardiology Office Note:    Date:  08/18/2019   ID:  Fynnley Feider, DOB 04-12-1946, MRN [id number]  PCP:  Cain Saupe, MD  Cardiologist:  Parke Poisson, MD  Electrophysiologist:  None   Referring MD: Cain Saupe, MD   Chief Complaint: chest pain, new afib  History of Present Illness:    Emily Hamilton is a 74 y.o. female with a hx of diabetes mellitus, HLD, HTN, who presents for chest pain, and was noted to have atrial fibrillation on today's EKG. Visit performed with assistance of Edouardo, the in person Spanish interpreter.  Pain in her chest and in her back over past several weeks. Right now pain is primarily substernal. Can last days at a time. Constantly there but waxes and wanes. Back pain makes chest pain worse and vise versa. Not worsened by cooking, cleaning her room. Does all her own daily chores- no change in chest pain. No SOB. Not worsened by deep breathing.   Family history - no known family history of CAD.  Has some intermittent palpitations, seconds at a time, doesn't know when started and doesn't bother her. She doesn't pay attention to it.   Treadmill stress in past, unremarkable.   Past Medical History:  Diagnosis Date  . Controlled type 2 diabetes mellitus (HCC)   . Family history of diabetes mellitus   . Hyperlipidemia   . Osteopenia   . Status post right knee replacement     Past Surgical History:  Procedure Laterality Date  . BREAST EXCISIONAL BIOPSY Left   . CHOLECYSTECTOMY    . Right knee replacement      Current Medications: Current Meds  Medication Sig  . aspirin EC 81 MG tablet Take 1 tablet (81 mg total) by mouth daily.  Marland Kitchen atorvastatin (LIPITOR) 40 MG tablet TOME UNA TABLETA TODOS LOS DIAS  . hydrochlorothiazide (MICROZIDE) 12.5 MG capsule TOME UNA CAPSULA TODOS LOS DIAS  . hydrocortisone (ANUSOL-HC) 25 MG suppository Place 1 suppository (25 mg total) rectally 2 (two) times daily as needed (Rectal burning).  .  isosorbide mononitrate (IMDUR) 30 MG 24 hr tablet Take 1 tablet (30 mg total) by mouth daily. Take 1/2 tablet by mouth daily  . lisinopril (ZESTRIL) 5 MG tablet Take 1 tablet (5 mg total) by mouth daily. Must have office visit for refills  . metFORMIN (GLUCOPHAGE) 500 MG tablet TAKE 2 TABLETS (1,000 MG TOTAL) BY MOUTH 2 (TWO) TIMES DAILY WITH A MEAL.     Allergies:   Patient has no known allergies.   Social History   Socioeconomic History  . Marital status: Married    Spouse name: Not on file  . Number of children: Not on file  . Years of education: Not on file  . Highest education level: Not on file  Occupational History  . Not on file  Tobacco Use  . Smoking status: Never Smoker  . Smokeless tobacco: Never Used  Substance and Sexual Activity  . Alcohol use: Never  . Drug use: Never  . Sexual activity: Never  Other Topics Concern  . Not on file  Social History Narrative  . Not on file   Social Determinants of Health   Financial Resource Strain:   . Difficulty of Paying Living Expenses: Not on file  Food Insecurity:   . Worried About Programme researcher, broadcasting/film/video in the Last Year: Not on file  . Ran Out of Food in the Last Year: Not on file  Transportation Needs:   .  Lack of Transportation (Medical): Not on file  . Lack of Transportation (Non-Medical): Not on file  Physical Activity:   . Days of Exercise per Week: Not on file  . Minutes of Exercise per Session: Not on file  Stress:   . Feeling of Stress : Not on file  Social Connections:   . Frequency of Communication with Friends and Family: Not on file  . Frequency of Social Gatherings with Friends and Family: Not on file  . Attends Religious Services: Not on file  . Active Member of Clubs or Organizations: Not on file  . Attends Banker Meetings: Not on file  . Marital Status: Not on file     Family History: The patient's family history includes Diabetes in her mother; Glaucoma in her mother.  ROS:     Please see the history of present illness.    All other systems reviewed and are negative.  EKGs/Labs/Other Studies Reviewed:    The following studies were reviewed today:  EKG:  Atrial fibrillation, rate 87, t wave abnormality, possible inferolateral ischemia.  Recent Labs: 08/05/2019: ALT 16; BUN 9; Creatinine, Ser 0.67; Hemoglobin 12.9; Platelets 191; Potassium 4.1; Sodium 140  Recent Lipid Panel    Component Value Date/Time   CHOL 155 08/05/2019 1049   TRIG 200 (H) 08/05/2019 1049   HDL 53 08/05/2019 1049   CHOLHDL 2.9 08/05/2019 1049   LDLCALC 69 08/05/2019 1049    Physical Exam:    VS:  BP 132/67   Pulse 87   Temp 97.9 F (36.6 C)   Ht 4\' 8"  (1.422 m)   Wt 163 lb 12.8 oz (74.3 kg)   SpO2 99%   BMI 36.72 kg/m     Wt Readings from Last 5 Encounters:  08/18/19 163 lb 12.8 oz (74.3 kg)  08/05/19 161 lb (73 kg)  01/20/19 157 lb (71.2 kg)  09/08/18 168 lb 3.2 oz (76.3 kg)  03/24/18 168 lb 3.2 oz (76.3 kg)     Constitutional: No acute distress Eyes: sclera non-icteric, normal conjunctiva and lids ENMT: normal dentition, moist mucous membranes Cardiovascular: regular rhythm, normal rate, no murmurs. S1 and S2 normal. Radial pulses normal bilaterally. No jugular venous distention.  Respiratory: clear to auscultation bilaterally GI : normal bowel sounds, soft and nontender. No distention.   MSK: extremities warm, well perfused. No edema.  NEURO: grossly nonfocal exam, moves all extremities. PSYCH: alert and oriented x 3, normal mood and affect.      ASSESSMENT:    1. Precordial pain   2. New onset a-fib (HCC)   3. Hyperlipidemia LDL goal <70   4. Type 2 diabetes mellitus without complication, without long-term current use of insulin (HCC)   5. Essential hypertension   6. Encounter for current long-term use of anticoagulants    PLAN:    Atypical chest pain - she has risk factors including diabetes, HTN, HLD. She endorses new chest discomfort. It will be  important to exclude ischemia with risk factors present. Will perform treadmill stress myoview. If no ischemia, this may be musculoskeletal,  or demand ischemia in the setting of new afib. Patient currently on ASA 81 mg daily, can discontinue in setting of new eliquis if ischemic testing normal and no other indication.   Afib - Afib noted on EKG today. Mild nonbothersome palpitations. Not currently on agent for rate control. Will need to consider cardiac monitor for afib burden and rates. We had a very thorough discussion about risk and benefits of  starting anticoagulation for stroke prevention in afib. We participated in shared decision making with the help of in person interpreter. Patient would like to proceed with [facility] for afib, we will start eliquis 5 mg BID. We discussed in great detail risk of stroke vs risk of bleeding and CHADSVASC vs HASBLED scores.  This patients CHA2DS2-VASc Score and unadjusted Ischemic Stroke Rate (% per year) is equal to 4.8 % stroke rate/year from a score of 4 Above score calculated as 1 point each if present [CHF, HTN, DM, Vascular=MI/PAD/Aortic Plaque, Age if 65-74, or Female] Above score calculated as 2 points each if present [Age > 75, or Stroke/TIA/TE]  HTN - controlled currently, continue HCTZ, lisinopril and imdur.   HLD - continue atorvastatin, LDL optimized.   DM2 - per IM, continue metformin.   Total time of encounter: 65 minutes total time of encounter, including 55 minutes spent in face-to-face patient care. This time includes coordination of care and counseling regarding new onset afib, anticoagulation, and workup of chest pain. Remainder of non-face-to-face time involved reviewing chart documents/testing relevant to the patient encounter and documentation in the medical record.  Cherlynn Kaiser, MD [place]  [facility]   Medication Adjustments/Labs and Tests Ordered: Current medicines are reviewed at length with the patient today.  Concerns  regarding medicines are outlined above.  Orders Placed This Encounter  Procedures  . MYOCARDIAL PERFUSION IMAGING  . EKG 12-Lead  . ECHOCARDIOGRAM COMPLETE   Meds ordered this encounter  Medications  . apixaban (ELIQUIS) 5 MG TABS tablet    Sig: Take 1 tablet (5 mg total) by mouth 2 (two) times daily.    Dispense:  60 tablet    Refill:  6    Patient Instructions  Medication Instructions:  Start taking  Medication Eliquis 5 mg  Take one tablet twice a day    continue all other medications  *If you need a refill on your cardiac medications before your next appointment, please call your pharmacy*  Lab Work:  Not needed  Testing/Procedures: WILL BE SCHEDULE AT [address] Your physician has requested that you have an echocardiogram. Echocardiography is a painless test that uses sound waves to create images of your heart. It provides your doctor with information about the size and shape of your heart and how well your heart's chambers and valves are working. This procedure takes approximately one hour. There are no restrictions for this procedure.  Ecocardiograma Echocardiogram Un ecocardiograma es un procedimiento que [place] ondas sonoras indoloras (ultrasonido) para obtener una imagen del corazn. Las imgenes de un ecocardiograma pueden proporcionar informacin importante sobre lo siguiente:  Signos de arteriopata coronaria (EAC).  Signos de aneurisma. Un aneurisma es una zona debilitada o daada de la pared de una arteria que se abulta por la fuerza normal del bombeo de la sangre a travs del cuerpo.  Tamao y forma del corazn. Los [employer] tamao y la forma del corazn se pueden asociar a determinadas afecciones, como insuficiencia cardaca, aneurisma y [place].  Funcin del msculo cardaco.  Funcin de la vlvula cardaca.  Signos de un infarto de miocardio previo.  Acumulacin de lquido alrededor del corazn.  Engrosamiento del msculo  cardaco.  Un tumor o crecimiento infeccioso alrededor de las vlvulas cardacas. Informe al mdico acerca de lo siguiente:  Cualquier alergia que tenga.  Todos los [employer] consume, incluidos vitaminas, hierbas, gotas oftlmicas, cremas y medicamentos de venta libre.  Cualquier enfermedad de la sangre que tenga.  Cirugas a las que se someti.  Cualquier enfermedad que tenga.  Si est embarazada o podra estarlo. Cules son los riesgos? En general, se trata de un procedimiento seguro. Sin embargo, pueden ocurrir complicaciones, por ejemplo:  Reaccin alrgica al tinte de contraste utilizado en el procedimiento. Qu ocurre antes del procedimiento? No se requiere Lobbyist. Podr comer y beber normalmente. Qu ocurre durante el procedimiento?   Pueden colocarle un tubo (catter) intravenoso en una de las venas.  Puede recibir Arts development officer a travs de esta va. Un contraste es una inyeccin que mejora la calidad de las imgenes del corazn.  Le aplicarn un gel sobre el pecho.  Le pasarn un instrumento similar a una vara (transductor) sobre el pecho. El gel ayudar a transmitir las [employer] del transductor.  Las ondas sonoras rebotarn inofensivamente en el corazn para permitir capturar las imgenes del corazn en movimiento, en tiempo real. Las imgenes se registrarn en una computadora. Este procedimiento puede variar segn el mdico y el hospital. Ladell Heads sucede despus del procedimiento?  Puede retomar su rutina diaria normal, incluidos la dieta, las actividades y [employer], a menos que su mdico le indique lo contrario. Resumen  Un ecocardiograma es un procedimiento que [place] ondas sonoras indoloras (ultrasonido) para obtener una imagen del corazn.  Las imgenes de un ecocardiograma pueden brindar informacin importante sobre el tamao y la forma del corazn, la funcin del msculo cardaco, la funcin de la vlvula cardaca y la  acumulacin de lquido alrededor del Programmer, multimedia.  No es necesario prepararse para este procedimiento. Podr comer y beber normalmente.  Al finalizar el ecocardiograma, puede retomar su rutina diaria normal, a menos que su mdico le indique lo contrario. Esta informacin no tiene Theme park manager el consejo del mdico. Asegrese de hacerle al mdico cualquier pregunta que tenga. Document Revised: 04/09/2017 Document Reviewed: 10/24/2016 Elsevier Patient Education  2020 Elsevier Inc.      WILL BE SCHEDULE AT [address] [place] cardaca Cardiac Nuclear Scan Una gammagrafa cardaca es una prueba que se realiza para verificar el flujo de sangre hacia el corazn. Se realiza cuando est en reposo y cuando hace ejercicio. La prueba puede detectar si:  No llega suficiente sangre a una regin del corazn.  El msculo cardaco no funciona como debera. Puede ser [employer] le hagan esta prueba si:  S[place] enfermedad cardaca.  Ha tenido resultados de laboratorio que no son normales.  Ha tenido una ciruga cardaca o un procedimiento de baln para abrir las arterias obstruidas (angioplastia).  Siente dolor en el pecho.  Le falta el aire. Para esta prueba, se pone un tinte especial (marcador) en el torrente sanguneo. El Holiday representative. Luego una cmara tomar imgenes del corazn para ver cmo se Engineer, technical sales a travs del corazn. Generalmente, esta prueba se realiza en un hospital y [place] 2 y 4horas. Informe al mdico acerca de:  Cualquier alergia que tenga.  Todos los Walt Disney, incluidos vitaminas, hierbas, gotas oftlmicas, cremas y [address] de [address].  Cualquier problema previo que usted o algn miembro de su familia haya tenido con los anestsicos.  Cualquier trastorno de la sangre que tenga.  Cirugas a las que se haya sometido.  Cualquier afeccin mdica que tenga.  Si est  embarazada o podra estarlo. Cules son los riesgos? Por lo general, se trata de un estudio seguro. Sin embargo, pueden presentarse problemas, por ejemplo:  Dolor intenso Doctor, general practice  e infarto de miocardio. Esto es solo un riesgo si se realiza la parte de la prueba de esfuerzo del procedimiento.  Latidos cardacos rpidos.  Una sensacin de Paediatric nurse. Esta sensacin por lo general no dura mucho tiempo.  Una reaccin alrgica al [employer]. Qu ocurre antes de esta prueba?  Consulte al mdico si debe cambiar o suspender sus medicamentos habituales. Esto es importante.  Siga las indicaciones del mdico respecto de lo que no puede comer o beber.  Qutese las [employer] de la prueba. Qu ocurre durante la prueba?  Le colocarn un tubo (catter) intravenoso en una de las venas.  El mdico le administrar una pequea cantidad de Engineer, manufacturing systems a travs del tubo (catter) intravenoso.  Usted esperar durante 20 a mientras el marcador se desplaza por el torrente sanguneo.  Se supervisar el corazn con un electrocardiograma (ECG).  Se recostar en una camilla.  Se tomarn imgenes del corazn durante alrededor de 15 a .  Tambin pueden hacerle Writer. Para esta prueba, puede realizarse una de estas cosas: ? Se le pedir que ejercite sobre una cinta caminadora o una bicicleta fija. ? Le administrarn medicamentos que harn que su corazn trabaje ms. Esto se realiza si no puede hacer ejercicio.  Cuando el corazn reciba el flujo mximo de [place], se volver a Clinical research associate a travs del tubo (catter) intravenoso.  Despus de 20 a 40 minutos, regresar a la camilla. Se le tomarn ms imgenes del corazn.  Segn el marcador utilizado, es posible que se deban tomar ms imgenes de 3 a 4 horas ms tarde.  Cuando la prueba haya terminado, se retirar el tubo (catter) intravenoso. El estudio puede variar segn el mdico y el hospital. Ladell Heads  sucede despus del estudio?  Pregntele al mdico lo siguiente: ? Si puede retomar su rutina normal, incluidos la [address], las actividades y [employer]. ? Si debe tomar ms lquido. Esto ayudar a Event organiser del cuerpo. Beba suficiente lquido para Radio producer pis (la orina) de color amarillo plido.  Consulte al mdico o pregunte en el departamento donde se realiza el estudio: ? Cundo estarn disponibles mis resultados? ? Cmo obtendr mis resultados? Resumen  Una gammagrafa cardaca es una prueba que se realiza para verificar el flujo de sangre hacia el corazn.  Informe al mdico si est embarazada o podra estarlo.  Antes de la prueba, consulte al mdico si debe cambiar o suspender sus medicamentos habituales. Esto es importante.  Consulte al mdico si puede volver a sus [employer]. Es posible que le indiquen que beba ms cantidad de lquidos. Esta informacin no tiene Theme park manager el consejo del mdico. Asegrese de hacerle al mdico cualquier pregunta que tenga. Document Revised: 02/08/2018 Document Reviewed: 02/08/2018 Elsevier Patient Education  2020 [employer].   Follow-Up: At [facility], you and your health needs are our priority.  As part of our continuing mission to provide you with exceptional heart care, we have created designated Provider Care Teams.  These Care Teams include your primary Cardiologist (physician) and Advanced Practice Providers (APPs -  Physician Assistants and Nurse Practitioners) who all work together to provide you with the care you need, when you need it.  Your next appointment:  2 to 3  Weeks  (  Week of feb 25 )  The format for your next appointment:   In Person  Provider:   Weston Brass, MD  Other Instructions  n/a

## 2019-08-26 ENCOUNTER — Other Ambulatory Visit: Payer: Self-pay

## 2019-08-26 ENCOUNTER — Ambulatory Visit
Admission: RE | Admit: 2019-08-26 | Discharge: 2019-08-26 | Disposition: A | Discharge status: Home / Self Care | Payer: Medicare Other | Source: Ambulatory Visit | Attending: Family Medicine | Admitting: Family Medicine

## 2019-08-26 ENCOUNTER — Ambulatory Visit: Payer: Medicare Other

## 2019-08-26 DIAGNOSIS — N644 Mastodynia: Secondary | ICD-10-CM

## 2019-08-31 ENCOUNTER — Telehealth (HOSPITAL_COMMUNITY): Payer: Self-pay | Admitting: *Deleted

## 2019-08-31 NOTE — Telephone Encounter (Signed)
Patient given detailed instructions per Myocardial Perfusion Study Information Sheet for the test on 09/05/19 at 10:30. Patient notified to arrive 15 minutes early and that it is imperative to arrive on time for appointment to keep from having the test rescheduled.  If you need to cancel or reschedule your appointment, please call the office within 24 hours of your appointment. . Patient verbalized understanding.Daneil Dolin

## 2019-09-05 ENCOUNTER — Other Ambulatory Visit: Payer: Self-pay

## 2019-09-05 ENCOUNTER — Ambulatory Visit (HOSPITAL_COMMUNITY): Payer: Medicare Other | Attending: Cardiovascular Disease

## 2019-09-05 ENCOUNTER — Ambulatory Visit (HOSPITAL_BASED_OUTPATIENT_CLINIC_OR_DEPARTMENT_OTHER): Payer: Medicare Other

## 2019-09-05 VITALS — Ht <= 58 in | Wt 163.0 lb

## 2019-09-05 DIAGNOSIS — R072 Precordial pain: Secondary | ICD-10-CM | POA: Insufficient documentation

## 2019-09-05 DIAGNOSIS — I4891 Unspecified atrial fibrillation: Secondary | ICD-10-CM

## 2019-09-05 DIAGNOSIS — I10 Essential (primary) hypertension: Secondary | ICD-10-CM | POA: Diagnosis not present

## 2019-09-05 DIAGNOSIS — R110 Nausea: Secondary | ICD-10-CM | POA: Insufficient documentation

## 2019-09-05 DIAGNOSIS — R079 Chest pain, unspecified: Secondary | ICD-10-CM | POA: Diagnosis not present

## 2019-09-05 DIAGNOSIS — E785 Hyperlipidemia, unspecified: Secondary | ICD-10-CM | POA: Diagnosis not present

## 2019-09-05 DIAGNOSIS — E119 Type 2 diabetes mellitus without complications: Secondary | ICD-10-CM | POA: Diagnosis not present

## 2019-09-05 LAB — MYOCARDIAL PERFUSION IMAGING
LV dias vol: 37 mL (ref 46–106)
LV sys vol: 9 mL
Peak HR: 123 {beats}/min
Rest HR: 78 {beats}/min
SDS: 0
SRS: 0
SSS: 0
TID: 1.01

## 2019-09-05 MED ORDER — REGADENOSON 0.4 MG/5ML IV SOLN
0.4000 mg | Freq: Once | INTRAVENOUS | Status: AC
  Administered 2019-09-05: 0.4 mg via INTRAVENOUS

## 2019-09-05 MED ORDER — TECHNETIUM TC 99M TETROFOSMIN IV KIT
31.6000 | PACK | Freq: Once | INTRAVENOUS | Status: AC | PRN
  Administered 2019-09-05: 31.6 via INTRAVENOUS
  Filled 2019-09-05: qty 32

## 2019-09-05 MED ORDER — AMINOPHYLLINE 25 MG/ML IV SOLN
75.0000 mg | Freq: Once | INTRAVENOUS | Status: AC
  Administered 2019-09-05: 75 mg via INTRAVENOUS

## 2019-09-05 MED ORDER — TECHNETIUM TC 99M TETROFOSMIN IV KIT
9.9000 | PACK | Freq: Once | INTRAVENOUS | Status: AC | PRN
  Administered 2019-09-05: 9.9 via INTRAVENOUS
  Filled 2019-09-05: qty 10

## 2019-09-08 ENCOUNTER — Other Ambulatory Visit: Payer: Self-pay | Admitting: Family Medicine

## 2019-09-08 ENCOUNTER — Telehealth: Payer: Self-pay | Admitting: Radiology

## 2019-09-08 ENCOUNTER — Ambulatory Visit (INDEPENDENT_AMBULATORY_CARE_PROVIDER_SITE_OTHER): Payer: Medicare Other | Admitting: Internal Medicine

## 2019-09-08 ENCOUNTER — Other Ambulatory Visit: Payer: Self-pay

## 2019-09-08 ENCOUNTER — Encounter: Payer: Self-pay | Admitting: Internal Medicine

## 2019-09-08 VITALS — BP 122/69 | HR 81 | Temp 97.0°F | Ht <= 58 in | Wt 163.0 lb

## 2019-09-08 DIAGNOSIS — I10 Essential (primary) hypertension: Secondary | ICD-10-CM

## 2019-09-08 DIAGNOSIS — E119 Type 2 diabetes mellitus without complications: Secondary | ICD-10-CM

## 2019-09-08 DIAGNOSIS — I4891 Unspecified atrial fibrillation: Secondary | ICD-10-CM | POA: Diagnosis not present

## 2019-09-08 DIAGNOSIS — Z7901 Long term (current) use of anticoagulants: Secondary | ICD-10-CM

## 2019-09-08 DIAGNOSIS — E785 Hyperlipidemia, unspecified: Secondary | ICD-10-CM | POA: Diagnosis not present

## 2019-09-08 DIAGNOSIS — R072 Precordial pain: Secondary | ICD-10-CM | POA: Diagnosis not present

## 2019-09-08 DIAGNOSIS — Z79899 Other long term (current) drug therapy: Secondary | ICD-10-CM

## 2019-09-08 NOTE — Telephone Encounter (Signed)
Enrolled patient for a 7 day Zio monitor to be mailed to patients home. Spanish instructions were sent

## 2019-09-08 NOTE — Patient Instructions (Addendum)
Medication Instructions:  No changes  Continue with current medications except stop taking aspirin    *If you need a refill on your cardiac medications before your next appointment, please call your pharmacy*  Lab Work: Not needed  Testing/Procedures: Your physician has recommended that you wear a 7 DAY ZIO-PATCH monitor. The Zio patch cardiac monitor continuously records heart rhythm data for up to 14 days, this is for patients being evaluated for multiple types heart rhythms. For the first 24 hours post application, please avoid getting the Zio monitor wet in the shower or by excessive sweating during exercise. After that, feel free to carry on with regular activities. Keep soaps and lotions away from the ZIO XT Patch.  This will be mailed to you, please expect 7-10 days to receive.   [place] location - [address], [address].        Follow-Up: At [facility], you and your health needs are our priority.  As part of our continuing mission to provide you with exceptional heart care, we have created designated Provider Care Teams.  These Care Teams include your primary Cardiologist (physician) and Advanced Practice Providers (APPs -  Physician Assistants and Nurse Practitioners) who all work together to provide you with the care you need, when you need it.  Your next appointment:   6 week(s)  The format for your next appointment:   In Person  Provider:   Weston Brass, MD  Other Instructions N/a   ZIO XT: instrucciones del monitor a largo plazo  Su mdico le ha solicitado que use su monitor de parche ZIO____7___ [employer].  Este es un monitor de parche nico. Irhythm proporciona un monitor de parche por inscripcin. No hay disponibles pegatinas adicionales.   No aplique el parche si le harn una prueba de esfuerzo nuclear, un ecocardiograma, una tomografa computarizada cardaca, una resonancia magntica o una radiografa de trax durante el perodo de tiempo en que  usara el monitor. El parche no se puede usar durante [address] No puede quitar y volver a Magazine features editor monitor de parche ZIO XT.   Su monitor de parche ZIO se enviar por correo prioritario de USPS desde [employer] a su direccin particular. El monitor tambin puede enviarse por correo a un apartado de correos si no se puede entregar a domicilio. Puede tomar de 3 a 5 das recibir su monitor despus de que se haya inscrito.   Una vez que haya recibido su monitor, revise las instrucciones adjuntas. Su monitor ya ha sido registrado asignndole un nmero de serie de monitor especfico.   Magazine features editor monitor  Afeite el pelo de la parte superior izquierda del pecho.   Sostenga el disco abrasivo por la pestaa naranja. Frote el abrasivo en 40 pasadas sobre la parte superior izquierda del pecho como se indica en las instrucciones del monitor.   Limpie el rea con 4 toallitas con alcohol incluidas. Use todas las almohadillas para asegurarse de que estn completamente limpias. Deje secar.  Aplique el parche como se indica en las instrucciones del monitor. El parche se colocar debajo de la clavcula en el lado izquierdo del pecho con la flecha apuntando [place] arriba.   Frote las alas [place] del parche durante 2 minutos. Retire la etiqueta blanca marcada con "1". Retire la etiqueta blanca marcada con "2". Frote las alas [place] del parche durante 2 minutos adicionales.   Mientras se mira en un espejo, presione y suelte el botn en el centro del parche.  Una pequea luz verde parpadear 3-4 veces. Este ser el nico indicador de que el monitor se ha encendido.     No se duche durante las primeras 24 horas. Puede ducharse despus de las primeras 24 horas.   Presione el botn si siente algn sntoma. Oir un pequeo clic. Anote la fecha, la hora y el sntoma en el libro de registro del [place].   Cuando est listo para quitarse el parche, siga las instrucciones de las 2  ltimas pginas del Libro de registro del [place]. Pegue el monitor de parche en la ltima pgina del Libro de registro del [place].   Coloque el libro de Engineer, maintenance (IT) del paciente en la [employer]. Use la lengeta de bloqueo en la caja y cierre la caja con [place]. La caja naranja y blanca tiene franqueo prepago. Coloque en el buzn lo antes posible. Su mdico debe recibir [employer] de la prueba aproximadamente 7 das despus de que el monitor haya sido enviado por correo a Irhythm.   Llame al servicio de atencin al [employer] al [phone] si tiene preguntas sobre su monitor de parche ZIO XT. Llmelos de inmediato si ve una luz naranja parpadeando en su monitor.   Si su monitor se cae en menos de [address] comunquese con nuestro departamento de Monitores al [phone]. Si su monitor se afloja o se cae despus de [address], llame a Meredeth Ide al [phone] para obtener sugerencias sobre cmo asegurar su monitor.   ZIO XT- Long Term Monitor Instructions   Your physician has requested you wear your ZIO patch monitor____7___days.   This is a single patch monitor.  Irhythm supplies one patch monitor per enrollment.  Additional stickers are not available.   Please do not apply patch if you will be having a Nuclear Stress Test, Echocardiogram, Cardiac CT, MRI, or Chest Xray during the time frame you would be wearing the monitor. The patch cannot be worn during these tests.  You cannot remove and re-apply the ZIO XT patch monitor.   Your ZIO patch monitor will be sent USPS Priority mail from [facility] directly to your home address. The monitor may also be mailed to a PO BOX if home delivery is not available.   It may take 3-5 days to receive your monitor after you have been enrolled.   Once you have received you monitor, please review enclosed instructions.  Your monitor has already been registered assigning a specific monitor serial # to you.   Applying  the monitor   Shave hair from upper left chest.   Hold abrader disc by orange tab.  Rub abrader in 40 strokes over left upper chest as indicated in your monitor instructions.   Clean area with 4 enclosed alcohol pads .  Use all pads to assure are is cleaned thoroughly.  Let dry.   Apply patch as indicated in monitor instructions.  Patch will be place under collarbone on left side of chest with arrow pointing upward.   Rub patch adhesive wings for 2 minutes.Remove white label marked "1".  Remove white label marked "2".  Rub patch adhesive wings for 2 additional minutes.   While looking in a mirror, press and release button in center of patch.  A small green light will flash 3-4 times .  This will be your only indicator the monitor has been turned on.     Do not shower for the first 24 hours.  You may shower after the first 24 hours.  Press button if you feel a symptom. You will hear a small click.  Record Date, Time and Symptom in the Patient Log Book.   When you are ready to remove patch, follow instructions on last 2 pages of Patient Log Book.  Stick patch monitor onto last page of Patient Log Book.   Place Patient Log Book in [place] box.  Use locking tab on box and tape box closed securely.  The Orange and [employer] has [employer] on it.  Please place in mailbox as soon as possible.  Your physician should have your test results approximately 7 days after the monitor has been mailed back to [facility].   Call [place] at [phone] if you have questions regarding your ZIO XT patch monitor.  Call them immediately if you see an orange light blinking on your monitor.   If your monitor falls off in less than 4 days contact our Monitor department at [phone].  If your monitor becomes loose or falls off after 4 days call [employer] at [phone] for suggestions on securing your monitor.

## 2019-09-08 NOTE — Progress Notes (Signed)
Cardiology Office Note:    Date:  09/08/2019   ID:  Emily Hamilton, DOB 1945-08-12, MRN [id number]  PCP:  Cain Saupe, MD  Cardiologist:  Parke Poisson, MD  Electrophysiologist:  None   Referring MD: Cain Saupe, MD   Chief Complaint: F/u afib and chest pain  History of Present Illness:    Emily Hamilton is a 74 y.o. female with a hx of diabetes mellitus, HLD, HTN, who presents for chest pain, and was noted to have atrial fibrillation on last visit EKG. Visit performed with assistance of the in person Spanish interpreter.  Discussed normal results of stress test and reassuring results of echo.   We discussed afib anticoagulation in depth. She is tolerating well without bleeding.   She continues to have a "breast pain" that is constant but waxes and wanes. No identifiable aggravating or alleviating factors. We discussed that it is possible that elevated rates in afib could give a sensation of chest discomfort.   Past Medical History:  Diagnosis Date  . Controlled type 2 diabetes mellitus (HCC)   . Family history of diabetes mellitus   . Hyperlipidemia   . Osteopenia   . Status post right knee replacement     Past Surgical History:  Procedure Laterality Date  . BREAST EXCISIONAL BIOPSY Left   . CHOLECYSTECTOMY    . Right knee replacement      Current Medications: Current Meds  Medication Sig  . apixaban (ELIQUIS) 5 MG TABS tablet Take 1 tablet (5 mg total) by mouth 2 (two) times daily.  . isosorbide mononitrate (IMDUR) 30 MG 24 hr tablet Take 1 tablet (30 mg total) by mouth daily. Take 1/2 tablet by mouth daily  . lisinopril (ZESTRIL) 5 MG tablet Take 1 tablet (5 mg total) by mouth daily. Must have office visit for refills  . metFORMIN (GLUCOPHAGE) 500 MG tablet TAKE 2 TABLETS (1,000 MG TOTAL) BY MOUTH 2 (TWO) TIMES DAILY WITH A MEAL.  . [DISCONTINUED] aspirin EC 81 MG tablet Take 1 tablet (81 mg total) by mouth daily.  . [DISCONTINUED] atorvastatin  (LIPITOR) 40 MG tablet TOME UNA TABLETA TODOS LOS DIAS  . [DISCONTINUED] hydrochlorothiazide (MICROZIDE) 12.5 MG capsule TOME UNA CAPSULA TODOS LOS DIAS     Allergies:   Patient has no known allergies.   Social History   Socioeconomic History  . Marital status: Married    Spouse name: Not on file  . Number of children: Not on file  . Years of education: Not on file  . Highest education level: Not on file  Occupational History  . Not on file  Tobacco Use  . Smoking status: Never Smoker  . Smokeless tobacco: Never Used  Substance and Sexual Activity  . Alcohol use: Never  . Drug use: Never  . Sexual activity: Never  Other Topics Concern  . Not on file  Social History Narrative  . Not on file   Social Determinants of Health   Financial Resource Strain:   . Difficulty of Paying Living Expenses: Not on file  Food Insecurity:   . Worried About Programme researcher, broadcasting/film/video in the Last Year: Not on file  . Ran Out of Food in the Last Year: Not on file  Transportation Needs:   . Lack of Transportation (Medical): Not on file  . Lack of Transportation (Non-Medical): Not on file  Physical Activity:   . Days of Exercise per Week: Not on file  . Minutes of Exercise per  Session: Not on file  Stress:   . Feeling of Stress : Not on file  Social Connections:   . Frequency of Communication with Friends and Family: Not on file  . Frequency of Social Gatherings with Friends and Family: Not on file  . Attends Religious Services: Not on file  . Active Member of Clubs or Organizations: Not on file  . Attends Archivist Meetings: Not on file  . Marital Status: Not on file     Family History: The patient's family history includes Diabetes in her mother; Glaucoma in her mother.  ROS:   Please see the history of present illness.    All other systems reviewed and are negative.  EKGs/Labs/Other Studies Reviewed:    The following studies were reviewed today:  EKG:  Not performed  today  Recent Labs: 08/05/2019: ALT 16; BUN 9; Creatinine, Ser 0.67; Hemoglobin 12.9; Platelets 191; Potassium 4.1; Sodium 140  Recent Lipid Panel    Component Value Date/Time   CHOL 155 08/05/2019 1049   TRIG 200 (H) 08/05/2019 1049   HDL 53 08/05/2019 1049   CHOLHDL 2.9 08/05/2019 1049   LDLCALC 69 08/05/2019 1049    Physical Exam:    VS:  BP 122/69   Pulse 81   Temp (!) 97 F (36.1 C)   Ht 4\' 8"  (1.422 m)   Wt 163 lb (73.9 kg)   SpO2 99%   BMI 36.54 kg/m     Wt Readings from Last 5 Encounters:  09/08/19 163 lb (73.9 kg)  09/05/19 163 lb (73.9 kg)  08/18/19 163 lb 12.8 oz (74.3 kg)  08/05/19 161 lb (73 kg)  01/20/19 157 lb (71.2 kg)     Constitutional: No acute distress Eyes: sclera non-icteric, normal conjunctiva and lids ENMT: mask in place Cardiovascular: irregular rhythm, normal rate, no murmurs. S1 and S2 normal. Radial pulses normal bilaterally. No jugular venous distention.  Respiratory: clear to auscultation bilaterally GI : normal bowel sounds, soft and nontender. No distention.   MSK: extremities warm, well perfused. No edema.  NEURO: grossly nonfocal exam, moves all extremities. PSYCH: alert and oriented x 3, normal mood and affect.      ASSESSMENT:    1. New onset a-fib ([place])   2. Precordial pain   3. Hyperlipidemia LDL goal <70   4. Type 2 diabetes mellitus without complication, without long-term current use of insulin ([place])   5. Essential hypertension   6. Encounter for current long-term use of anticoagulants   7. Medication management    PLAN:    Atypical chest pain - we will ensure this does not represent poor rate control in afib by obtaining a 7 day monitor for afib rates.   Afib - no indication for rate control by exam. Continue eliquis. Discussed in depth again. Can discontinue aspirin.   HLD - continue atorvastatin 40 mg daily  HTN - continue lisinopril, HCTZ, IMDUR.   DM2 - continue metformin.  Total time of encounter: 40  minutes total time of encounter, including 30 minutes spent in face-to-face patient care. This time includes coordination of care and counseling regarding above mentioned problem list. Remainder of non-face-to-face time involved reviewing chart documents/testing relevant to the patient encounter and documentation in the medical record.  Cherlynn Kaiser, MD [place]  [facility]    Medication Adjustments/Labs and Tests Ordered: Current medicines are reviewed at length with the patient today.  Concerns regarding medicines are outlined above.  Orders Placed This Encounter  Procedures  .  LONG TERM MONITOR (3-14 DAYS)   No orders of the defined types were placed in this encounter.   Patient Instructions  Medication Instructions:  No changes  Continue with current medications except stop taking aspirin    *If you need a refill on your cardiac medications before your next appointment, please call your pharmacy*  Lab Work: Not needed  Testing/Procedures: Your physician has recommended that you wear a 7 DAY ZIO-PATCH monitor. The Zio patch cardiac monitor continuously records heart rhythm data for up to 14 days, this is for patients being evaluated for multiple types heart rhythms. For the first 24 hours post application, please avoid getting the Zio monitor wet in the shower or by excessive sweating during exercise. After that, feel free to carry on with regular activities. Keep soaps and lotions away from the ZIO XT Patch.  This will be mailed to you, please expect 7-10 days to receive.   [place] location - [address], [address].        Follow-Up: At [facility], you and your health needs are our priority.  As part of our continuing mission to provide you with exceptional heart care, we have created designated Provider Care Teams.  These Care Teams include your primary Cardiologist (physician) and Advanced Practice Providers (APPs -  Physician Assistants and Nurse  Practitioners) who all work together to provide you with the care you need, when you need it.  Your next appointment:   6 week(s)  The format for your next appointment:   In Person  Provider:   Weston Brass, MD  Other Instructions N/a   ZIO XT: instrucciones del monitor a largo plazo  Su mdico le ha solicitado que use su monitor de parche ZIO____7___ [employer].  Este es un monitor de parche nico. Irhythm proporciona un monitor de parche por inscripcin. No hay disponibles pegatinas adicionales.   No aplique el parche si le harn una prueba de esfuerzo nuclear, un ecocardiograma, una tomografa computarizada cardaca, una resonancia magntica o una radiografa de trax durante el perodo de tiempo en que usara el monitor. El parche no se puede usar durante [address] No puede quitar y volver a Magazine features editor monitor de parche ZIO XT.   Su monitor de parche ZIO se enviar por correo prioritario de USPS desde [employer] a su direccin particular. El monitor tambin puede enviarse por correo a un apartado de correos si no se puede entregar a domicilio. Puede tomar de 3 a 5 das recibir su monitor despus de que se haya inscrito.   Una vez que haya recibido su monitor, revise las instrucciones adjuntas. Su monitor ya ha sido registrado asignndole un nmero de serie de monitor especfico.   Magazine features editor monitor  Afeite el pelo de la parte superior izquierda del pecho.   Sostenga el disco abrasivo por la pestaa naranja. Frote el abrasivo en 40 pasadas sobre la parte superior izquierda del pecho como se indica en las instrucciones del monitor.   Limpie el rea con 4 toallitas con alcohol incluidas. Use todas las almohadillas para asegurarse de que estn completamente limpias. Deje secar.  Aplique el parche como se indica en las instrucciones del monitor. El parche se colocar debajo de la clavcula en el lado izquierdo del pecho con la flecha apuntando [place]  arriba.   Frote las alas [place] del parche durante 2 minutos. Retire la etiqueta blanca marcada con "1". Retire la etiqueta blanca marcada con "2". Frote las alas [place]  del parche durante 2 minutos adicionales.   Mientras se mira en un espejo, presione y suelte el botn en el centro del parche. Una pequea luz verde parpadear 3-4 veces. Este ser el nico indicador de que el monitor se ha encendido.     No se duche durante las primeras 24 horas. Puede ducharse despus de las primeras 24 horas.   Presione el botn si siente algn sntoma. Oir un pequeo clic. Anote la fecha, la hora y el sntoma en el libro de registro del [place].   Cuando est listo para quitarse el parche, siga las instrucciones de las 2 ltimas pginas del Libro de registro del [place]. Pegue el monitor de parche en la ltima pgina del Libro de registro del [place].   Coloque el libro de Engineer, maintenance (IT) del paciente en la [employer]. Use la lengeta de bloqueo en la caja y cierre la caja con [place]. La caja naranja y blanca tiene franqueo prepago. Coloque en el buzn lo antes posible. Su mdico debe recibir [employer] de la prueba aproximadamente 7 das despus de que el monitor haya sido enviado por correo a [employer].   Llame al servicio de atencin al [employer] al [phone] si tiene preguntas sobre su monitor de parche ZIO XT. Llmelos de inmediato si ve una luz naranja parpadeando en su monitor.   Si su monitor se cae en menos de [address], comunquese con nuestro departamento de Monitores al [phone]. Si su monitor se afloja o se cae despus de [address] llame a Meredeth Ide al [phone] para obtener sugerencias sobre cmo asegurar su monitor.   ZIO XT- Long Term Monitor Instructions   Your physician has requested you wear your ZIO patch monitor____7___days.   This is a single patch monitor.  Irhythm supplies one patch monitor per enrollment.  Additional stickers are not  available.   Please do not apply patch if you will be having a Nuclear Stress Test, Echocardiogram, Cardiac CT, MRI, or Chest Xray during the time frame you would be wearing the monitor. The patch cannot be worn during these tests.  You cannot remove and re-apply the ZIO XT patch monitor.   Your ZIO patch monitor will be sent USPS Priority mail from [facility] directly to your home address. The monitor may also be mailed to a PO BOX if home delivery is not available.   It may take 3-5 days to receive your monitor after you have been enrolled.   Once you have received you monitor, please review enclosed instructions.  Your monitor has already been registered assigning a specific monitor serial # to you.   Applying the monitor   Shave hair from upper left chest.   Hold abrader disc by orange tab.  Rub abrader in 40 strokes over left upper chest as indicated in your monitor instructions.   Clean area with 4 enclosed alcohol pads .  Use all pads to assure are is cleaned thoroughly.  Let dry.   Apply patch as indicated in monitor instructions.  Patch will be place under collarbone on left side of chest with arrow pointing upward.   Rub patch adhesive wings for 2 minutes.Remove white label marked "1".  Remove white label marked "2".  Rub patch adhesive wings for 2 additional minutes.   While looking in a mirror, press and release button in center of patch.  A small green light will flash 3-4 times .  This will be your only indicator the monitor has been  turned on.     Do not shower for the first 24 hours.  You may shower after the first 24 hours.   Press button if you feel a symptom. You will hear a small click.  Record Date, Time and Symptom in the Patient Log Book.   When you are ready to remove patch, follow instructions on last 2 pages of Patient Log Book.  Stick patch monitor onto last page of Patient Log Book.   Place Patient Log Book in [place] box.  Use locking tab on box and  tape box closed securely.  The Orange and [employer] has [employer] on it.  Please place in mailbox as soon as possible.  Your physician should have your test results approximately 7 days after the monitor has been mailed back to [facility].   Call [facility] Customer Care at [phone] if you have questions regarding your ZIO XT patch monitor.  Call them immediately if you see an orange light blinking on your monitor.   If your monitor falls off in less than 4 days contact our Monitor department at [phone].  If your monitor becomes loose or falls off after 4 days call [employer] at [phone] for suggestions on securing your monitor.

## 2019-09-09 ENCOUNTER — Other Ambulatory Visit: Payer: Self-pay | Admitting: Physician Assistant

## 2019-09-09 DIAGNOSIS — I10 Essential (primary) hypertension: Secondary | ICD-10-CM

## 2019-09-09 DIAGNOSIS — E119 Type 2 diabetes mellitus without complications: Secondary | ICD-10-CM

## 2019-09-14 ENCOUNTER — Other Ambulatory Visit (INDEPENDENT_AMBULATORY_CARE_PROVIDER_SITE_OTHER): Payer: Medicare HMO

## 2019-09-14 DIAGNOSIS — I4891 Unspecified atrial fibrillation: Secondary | ICD-10-CM

## 2019-09-27 DIAGNOSIS — I4891 Unspecified atrial fibrillation: Secondary | ICD-10-CM | POA: Diagnosis not present

## 2019-10-24 ENCOUNTER — Ambulatory Visit (INDEPENDENT_AMBULATORY_CARE_PROVIDER_SITE_OTHER): Payer: Medicare HMO | Admitting: Internal Medicine

## 2019-10-24 ENCOUNTER — Other Ambulatory Visit: Payer: Self-pay

## 2019-10-24 ENCOUNTER — Encounter: Payer: Self-pay | Admitting: Internal Medicine

## 2019-10-24 VITALS — BP 120/66 | HR 76 | Ht <= 58 in | Wt 165.0 lb

## 2019-10-24 DIAGNOSIS — Z79899 Other long term (current) drug therapy: Secondary | ICD-10-CM

## 2019-10-24 DIAGNOSIS — I10 Essential (primary) hypertension: Secondary | ICD-10-CM

## 2019-10-24 DIAGNOSIS — E119 Type 2 diabetes mellitus without complications: Secondary | ICD-10-CM | POA: Diagnosis not present

## 2019-10-24 DIAGNOSIS — I4819 Other persistent atrial fibrillation: Secondary | ICD-10-CM

## 2019-10-24 DIAGNOSIS — E785 Hyperlipidemia, unspecified: Secondary | ICD-10-CM

## 2019-10-24 DIAGNOSIS — Z7901 Long term (current) use of anticoagulants: Secondary | ICD-10-CM | POA: Diagnosis not present

## 2019-10-24 DIAGNOSIS — R072 Precordial pain: Secondary | ICD-10-CM | POA: Diagnosis not present

## 2019-10-24 NOTE — Patient Instructions (Signed)
Medication Instructions:  The current medical regimen is effective;  continue present plan and medications.  *If you need a refill on your cardiac medications before your next appointment, please call your pharmacy*   Follow-Up: At [facility], you and your health needs are our priority.  As part of our continuing mission to provide you with exceptional heart care, we have created designated Provider Care Teams.  These Care Teams include your primary Cardiologist (physician) and Advanced Practice Providers (APPs -  Physician Assistants and Nurse Practitioners) who all work together to provide you with the care you need, when you need it.  We recommend signing up for the patient portal called "MyChart".  Sign up information is provided on this After Visit Summary.  MyChart is used to connect with patients for Virtual Visits (Telemedicine).  Patients are able to view lab/test results, encounter notes, upcoming appointments, etc.  Non-urgent messages can be sent to your provider as well.   To learn more about what you can do with MyChart, go to [email or web address].    Your next appointment:   3 month(s)  The format for your next appointment:   In Person  Provider:   Weston Brass, MD   Other Instructions  Fibrilacin auricular Atrial Fibrillation  La fibrilacin auricular es un tipo de latido cardaco irregular o rpido. Si sufre esta afeccin, su corazn late sin ningn orden. Esto dificulta el bombeo de la sangre por parte del corazn de [place] normal. La fibrilacin auricular puede aparecer y Geneticist, molecular, o puede convertirse en un problema prolongado. Si esta afeccin no se trata, puede aumentar el riesgo de accidente cerebrovascular, insuficiencia cardaca y otros problemas del corazn. Cules son las causas? Esta afeccin ser causada por enfermedades que daan el corazn. Incluyen las siguientes:  Presin arterial alta.  Insuficiencia cardaca.  Enfermedad de las  vlvulas cardacas.  Ciruga cardaca. Otras causas son las siguientes:  Diabetes.  Enfermedad tiroidea.  Tener sobrepeso.  Enfermedad renal. Algunas veces, la causa no se conoce. Qu incrementa el riesgo? Es ms probable que sufra esta afeccin si:  Es Neomia Dear persona de edad avanzada.  Fuma.  Hace ejercicio muy extenuante con frecuencia.  Tiene antecedentes familiares de esta afeccin.  Es hombre.  Consume drogas.  Bebe mucho alcohol.  Tiene afecciones pulmonares, como enfisema, neumona o EPOC.  Tiene apnea del sueo. Cules son los signos o sntomas? Los sntomas frecuentes de esta afeccin [employer] siguientes:  Sensacin de que el corazn late [place] rpido.  Dolor o International aid/development worker.  Falta de aire.  Sensacin repentina de debilidad o de desvanecimiento.  Cansarse con facilidad al hacer actividad fsica.  Desmayos.  Sudoracin. En algunos casos, no hay sntomas. Cmo se trata? El tratamiento depende de las afecciones subyacentes y de cmo se siente cuando tiene fibrilacin auricular. Incluyen las siguientes:  Medicamentos para: ? Prevenir los cogulos de [place]. ? Tratar los problemas de frecuencia cardaca o de ritmo cardaco.  Usar dispositivos, por ejemplo, un marcapasos, para corregir problemas del ritmo cardaco.  Garnetta Buddy ciruga para extirpar la parte del corazn que enva seales incorrectas.  Cerrar una zona donde se pueden formar cogulos en el corazn (orejuela auricular izquierda). En algunos casos, el mdico tratar otras afecciones subyacentes. Siga estas instrucciones en su casa: Medicamentos  Use los medicamentos de venta libre y los recetados solamente como se lo haya indicado el mdico.  No tome ningn medicamento nuevo sin hablar primero con su mdico.  Si est tomando anticoagulantes, tenga en  cuenta lo siguiente: ? Hable con el mdico antes de tomar cualquier medicamento que contenga aspirina o  antiinflamatorios no esteroideos (AINE), como el ibuprofeno. ? Tome los medicamentos exactamente como le indic el mdico. Tmelos a la [employer]. ? Evite las actividades que podran causarle lesiones o moretones. Siga las instrucciones acerca de cmo evitar las cadas. ? Use un brazalete que indique que est tomando anticoagulantes. O lleve consigo una tarjeta que [employer] medicamentos que est tomando. Estilo de vida      No consuma ningn producto que contenga nicotina o tabaco. Estos incluyen cigarrillos, cigarrillos electrnicos y tabaco para Theatre manager. Si necesita ayuda para dejar de fumar, consulte al mdico.  Consuma alimentos cardiosaludables. Hable con su mdico sobre el plan de alimentacin adecuado para usted.  Haga ejercicios con regularidad tal como se lo indic el mdico.  No beba alcohol.  Baje de peso si es necesario.  No consuma drogas, ni siquiera cannabis. Instrucciones generales  Si tiene una afeccin que hace que se detenga la respiracin por breves perodos (apnea), trtela como le indique su mdico.  Mantenga un peso saludable. No use pldoras para bajar de peso salvo que su mdico le diga que son seguras. Estas pastillas pueden agravar los problemas cardacos.  Concurra a todas las visitas de [address] se lo haya indicado el mdico. Esto es importante. Comunquese con un mdico si:  Nota un cambio en la velocidad, el ritmo o la fuerza de los latidos cardacos.  Toma medicamentos anticoagulantes y tiene ms moretones.  Se cansa con ms facilidad cuando se mueve o hace ejercicio.  Tiene un cambio repentino [employer] Solicite ayuda de inmediato si:   Siente dolor en el pecho o en la barriga (abdomen).  Tiene dificultad para respirar.  Tiene efectos secundarios de los anticoagulantes, como sangre en el vmito, la materia fecal (heces) o el pis (orina), o sangrado que no puede detenerse.  Tiene algn signo de accidente  cerebrovascular. "BE FAST" es una manera fcil de recordar las principales seales de advertencia: ? B - Balance (equilibrio). Los signos son mareos, dificultad repentina para caminar o prdida del equilibrio. ? E - Eyes (ojos). Los signos son dificultad para ver o un cambio en la visin. ? F - Face (rostro). Los signos son debilidad repentina o prdida de la sensibilidad en la cara, o que la cara o el prpado se caigan hacia un lado. ? A - Arms (brazos). Los signos son debilidad o prdida de la sensibilidad en un brazo. Esto sucede de repente y generalmente en un lado del cuerpo. ? S - Speech (habla). Los signos son dificultad para hablar, hablar arrastrando las palabras o dificultad para comprender lo que la gente dice. ? T - Time (tiempo). Es tiempo de llamar al servicio de Sports administrator. Anote la hora a la que [employer] sntomas.  Presenta otros signos de un accidente cerebrovascular, como los siguientes: ? Dolor de [place] repentino y muy intenso sin causa aparente. ? Ganas de vomitar (nuseas). ? Vmitos. ? Una convulsin. Estos sntomas pueden Customer service manager. No espere a ver si los sntomas desaparecen. Solicite atencin mdica de inmediato. Comunquese con el servicio de emergencias de su localidad (911 en los [place]). No conduzca por sus propios medios [employer]. Resumen  La fibrilacin auricular es un tipo de latido cardaco irregular o rpido.  Est expuesto a un riesgo mayor de sufrir esta afeccin si fuma, es una persona de edad [place], tiene  diabetes o tiene sobrepeso.  Siga las instrucciones de su mdico [employer], la dieta, el ejercicio y las visitas de seguimiento.  Obtenga ayuda de inmediato si tiene signos o sntomas de un accidente cerebrovascular.  Busque ayuda de inmediato si no puede respirar o siente dolor o Immunologist. Esta informacin no tiene Marine scientist el consejo del mdico. Asegrese de hacerle al mdico  cualquier pregunta que tenga. Document Revised: 02/15/2019 Document Reviewed: 02/15/2019 Elsevier Patient Education  [place].

## 2019-10-24 NOTE — Progress Notes (Signed)
Cardiology Office Note:    Date:  10/24/2019   ID:  Emily Hamilton, DOB May 21, 1946, MRN [id number]  PCP:  Antony Blackbird, MD  Cardiologist:  Elouise Munroe, MD  Electrophysiologist:  None   Referring MD: Antony Blackbird, MD   Chief Complaint: f/u results, afib  History of Present Illness:    Emily Hamilton is a 74 y.o. female with a hx of diabetes mellitus, HLD, HTN, who presents for chest pain, and was noted to have atrial fibrillation on last visit EKG. Visit performed with assistance of the in person Spanish interpreter.  Normal results of stress test and reassuring results of echo.   We discussed afib anticoagulation in depth. She is tolerating well without bleeding.   No significant chest pain.   100% burden of atrial fibrillation.  We discussed cardioversion today for attempt at rest or sinus rhythm.  Patient is overall asymptomatic and does not feel that this is necessary at this time.  We discussed that with severe left atrial enlargement, likelihood that she will stay in sinus rhythm is low, which may make attempts at restoration of sinus rhythm not as useful if patient is asymptomatic.  She understands this and agrees. Past Medical History:  Diagnosis Date  . Controlled type 2 diabetes mellitus ([place])   . Family history of diabetes mellitus   . Hyperlipidemia   . Osteopenia   . Status post right knee replacement     Past Surgical History:  Procedure Laterality Date  . BREAST EXCISIONAL BIOPSY Left   . CHOLECYSTECTOMY    . Right knee replacement      Current Medications: Current Meds  Medication Sig  . apixaban (ELIQUIS) 5 MG TABS tablet Take 1 tablet (5 mg total) by mouth 2 (two) times daily.  Marland Kitchen aspirin 81 MG EC tablet TOME UNA TABLETA TODOS LOS DIAS  . atorvastatin (LIPITOR) 40 MG tablet TOME UNA TABLETA TODOS LOS DIAS  . hydrochlorothiazide (MICROZIDE) 12.5 MG capsule TOME UNA CAPSULA TODOS LOS DIAS  . hydrocortisone (ANUSOL-HC) 25 MG  suppository Place 1 suppository (25 mg total) rectally 2 (two) times daily as needed (Rectal burning).  . isosorbide mononitrate (IMDUR) 30 MG 24 hr tablet Take 1 tablet (30 mg total) by mouth daily. Take 1/2 tablet by mouth daily  . lisinopril (ZESTRIL) 5 MG tablet Take 1 tablet (5 mg total) by mouth daily. Must have office visit for refills  . metFORMIN (GLUCOPHAGE) 500 MG tablet TAKE 2 TABLETS (1,000 MG TOTAL) BY MOUTH 2 (TWO) TIMES DAILY WITH A MEAL.     Allergies:   Patient has no known allergies.   Social History   Socioeconomic History  . Marital status: Married    Spouse name: Not on file  . Number of children: Not on file  . Years of education: Not on file  . Highest education level: Not on file  Occupational History  . Not on file  Tobacco Use  . Smoking status: Never Smoker  . Smokeless tobacco: Never Used  Substance and Sexual Activity  . Alcohol use: Never  . Drug use: Never  . Sexual activity: Never  Other Topics Concern  . Not on file  Social History Narrative  . Not on file   Social Determinants of Health   Financial Resource Strain:   . Difficulty of Paying Living Expenses:   Food Insecurity:   . Worried About Charity fundraiser in the Last Year:   . [employer]  in the Last Year:   Transportation Needs:   . Freight forwarderLack of Transportation (Medical):   Marland Kitchen. Lack of Transportation (Non-Medical):   Physical Activity:   . Days of Exercise per Week:   . Minutes of Exercise per Session:   Stress:   . Feeling of Stress :   Social Connections:   . Frequency of Communication with Friends and Family:   . Frequency of Social Gatherings with Friends and Family:   . Attends Religious Services:   . Active Member of Clubs or Organizations:   . Attends BankerClub or Organization Meetings:   Marland Kitchen. Marital Status:      Family History: The patient's family history includes Diabetes in her mother; Glaucoma in her mother.  ROS:   Please see the history of present illness.    All  other systems reviewed and are negative.  EKGs/Labs/Other Studies Reviewed:    The following studies were reviewed today:  EKG:  Afib, rate 76. T wave abnormality laterally.   Recent Labs: 08/05/2019: ALT 16; BUN 9; Creatinine, Ser 0.67; Hemoglobin 12.9; Platelets 191; Potassium 4.1; Sodium 140  Recent Lipid Panel    Component Value Date/Time   CHOL 155 08/05/2019 1049   TRIG 200 (H) 08/05/2019 1049   HDL 53 08/05/2019 1049   CHOLHDL 2.9 08/05/2019 1049   LDLCALC 69 08/05/2019 1049    Physical Exam:    VS:  BP 120/66 (BP Location: Left Arm, Patient Position: Sitting, Cuff Size: Large)   Pulse 76   Ht 4\' 8"  (1.422 m)   Wt 165 lb (74.8 kg)   BMI 36.99 kg/m     Wt Readings from Last 5 Encounters:  10/24/19 165 lb (74.8 kg)  09/08/19 163 lb (73.9 kg)  09/05/19 163 lb (73.9 kg)  08/18/19 163 lb 12.8 oz (74.3 kg)  08/05/19 161 lb (73 kg)     Constitutional: No acute distress Eyes: sclera non-icteric, normal conjunctiva and lids ENMT: normal dentition, moist mucous membranes Cardiovascular: regular rhythm, normal rate, no murmurs. S1 and S2 normal. Radial pulses normal bilaterally. No jugular venous distention.  Respiratory: clear to auscultation bilaterally GI : normal bowel sounds, soft and nontender. No distention.   MSK: extremities warm, well perfused. No edema.  NEURO: grossly nonfocal exam, moves all extremities. PSYCH: alert and oriented x 3, normal mood and affect.   ASSESSMENT:    1. Persistent atrial fibrillation (HCC)   2. Precordial pain   3. Hyperlipidemia LDL goal <70   4. Type 2 diabetes mellitus without complication, without long-term current use of insulin (HCC)   5. Essential hypertension   6. Encounter for current long-term use of anticoagulants   7. Medication management    PLAN:    Persistent atrial fibrillation (HCC)-she has a 100% burden of atrial fibrillation on monitor but has good rate control.  She is not currently on AV nodal blockers.   We can add these if and when she has faster rates in A. fib.  She tells me she is asymptomatic.  I have offered her cardioversion, however she is feeling well and requests that if this is not mandatory, we can defer this at this time.  She is taking Eliquis 5 mg twice daily without issues. This patients CHA2DS2-VASc Score and unadjusted Ischemic Stroke Rate (% per year) is equal to 4.8 % stroke rate/year from a score of 4 Above score calculated as 1 point each if present [CHF, HTN, DM, Vascular=MI/PAD/Aortic Plaque, Age if 65-74, or Female] Above score calculated  as 2 points each if present [Age > 75, or Stroke/TIA/TE]  Precordial pain-no significant chest pain, nonischemic stress test  Hyperlipidemia LDL goal <70-continue atorvastatin 40 mg daily  Type 2 diabetes mellitus without complication, without long-term current use of insulin (HCC)-per PCP, patient is on Metformin  Essential hypertension-continue hydrochlorothiazide, Imdur, lisinopril  Encounter for current long-term use of anticoagulants-stable on Eliquis  Medication management-as above  Total time of encounter: 40 minutes total time of encounter, including 30 minutes spent in face-to-face patient care on the date of this encounter. This time includes coordination of care and counseling regarding above mentioned problem list. Remainder of non-face-to-face time involved reviewing chart documents/testing relevant to the patient encounter and documentation in the medical record. I have independently reviewed documentation from referring provider.  Additional time spent due to need for interpreter services  Weston Brass, MD [place]  [facility]    Medication Adjustments/Labs and Tests Ordered: Current medicines are reviewed at length with the patient today.  Concerns regarding medicines are outlined above.  No orders of the defined types were placed in this encounter.  No orders of the defined types were placed in this  encounter.   Patient Instructions  Medication Instructions:  The current medical regimen is effective;  continue present plan and medications.  *If you need a refill on your cardiac medications before your next appointment, please call your pharmacy*   Follow-Up: At [facility], you and your health needs are our priority.  As part of our continuing mission to provide you with exceptional heart care, we have created designated Provider Care Teams.  These Care Teams include your primary Cardiologist (physician) and Advanced Practice Providers (APPs -  Physician Assistants and Nurse Practitioners) who all work together to provide you with the care you need, when you need it.  We recommend signing up for the patient portal called "MyChart".  Sign up information is provided on this After Visit Summary.  MyChart is used to connect with patients for Virtual Visits (Telemedicine).  Patients are able to view lab/test results, encounter notes, upcoming appointments, etc.  Non-urgent messages can be sent to your provider as well.   To learn more about what you can do with MyChart, go to [email or web address].    Your next appointment:   3 month(s)  The format for your next appointment:   In Person  Provider:   Weston Brass, MD   Other Instructions  Fibrilacin auricular Atrial Fibrillation  La fibrilacin auricular es un tipo de latido cardaco irregular o rpido. Si sufre esta afeccin, su corazn late sin ningn orden. Esto dificulta el bombeo de la sangre por parte del corazn de [place] normal. La fibrilacin auricular puede aparecer y Geneticist, molecular, o puede convertirse en un problema prolongado. Si esta afeccin no se trata, puede aumentar el riesgo de accidente cerebrovascular, insuficiencia cardaca y otros problemas del corazn. Cules son las causas? Esta afeccin ser causada por enfermedades que daan el corazn. Incluyen las siguientes:  Presin arterial  alta.  Insuficiencia cardaca.  Enfermedad de las vlvulas cardacas.  Ciruga cardaca. Otras causas son las siguientes:  Diabetes.  Enfermedad tiroidea.  Tener sobrepeso.  Enfermedad renal. Algunas veces, la causa no se conoce. Qu incrementa el riesgo? Es ms probable que sufra esta afeccin si:  Es Neomia Dear persona de edad avanzada.  Fuma.  Hace ejercicio muy extenuante con frecuencia.  Tiene antecedentes familiares de esta afeccin.  Es hombre.  Consume drogas.  Bebe mucho alcohol.  Tiene afecciones pulmonares, como  enfisema, neumona o EPOC.  Tiene apnea del sueo. Cules son los signos o sntomas? Los sntomas frecuentes de esta afeccin [employer] siguientes:  Sensacin de que el corazn late [place] rpido.  Dolor o International aid/development worker.  Falta de aire.  Sensacin repentina de debilidad o de desvanecimiento.  Cansarse con facilidad al hacer actividad fsica.  Desmayos.  Sudoracin. En algunos casos, no hay sntomas. Cmo se trata? El tratamiento depende de las afecciones subyacentes y de cmo se siente cuando tiene fibrilacin auricular. Incluyen las siguientes:  Medicamentos para: ? Prevenir los cogulos de [place]. ? Tratar los problemas de frecuencia cardaca o de ritmo cardaco.  Usar dispositivos, por ejemplo, un marcapasos, para corregir problemas del ritmo cardaco.  Garnetta Buddy ciruga para extirpar la parte del corazn que enva seales incorrectas.  Cerrar una zona donde se pueden formar cogulos en el corazn (orejuela auricular izquierda). En algunos casos, el mdico tratar otras afecciones subyacentes. Siga estas instrucciones en su casa: Medicamentos  Use los medicamentos de venta libre y los recetados solamente como se lo haya indicado el mdico.  No tome ningn medicamento nuevo sin hablar primero con su mdico.  Si est tomando anticoagulantes, tenga en cuenta lo siguiente: ? Hable con el mdico antes de tomar  cualquier medicamento que contenga aspirina o antiinflamatorios no esteroideos (AINE), como el ibuprofeno. ? Tome los medicamentos exactamente como le indic el mdico. Tmelos a la [employer]. ? Evite las actividades que podran causarle lesiones o moretones. Siga las instrucciones acerca de cmo evitar las cadas. ? Use un brazalete que indique que est tomando anticoagulantes. O lleve consigo una tarjeta que [employer] medicamentos que est tomando. Estilo de vida      No consuma ningn producto que contenga nicotina o tabaco. Estos incluyen cigarrillos, cigarrillos electrnicos y tabaco para Theatre manager. Si necesita ayuda para dejar de fumar, consulte al mdico.  Consuma alimentos cardiosaludables. Hable con su mdico sobre el plan de alimentacin adecuado para usted.  Haga ejercicios con regularidad tal como se lo indic el mdico.  No beba alcohol.  Baje de peso si es necesario.  No consuma drogas, ni siquiera cannabis. Instrucciones generales  Si tiene una afeccin que hace que se detenga la respiracin por breves perodos (apnea), trtela como le indique su mdico.  Mantenga un peso saludable. No use pldoras para bajar de peso salvo que su mdico le diga que son seguras. Estas pastillas pueden agravar los problemas cardacos.  Concurra a todas las visitas de [address] se lo haya indicado el mdico. Esto es importante. Comunquese con un mdico si:  Nota un cambio en la velocidad, el ritmo o la fuerza de los latidos cardacos.  Toma medicamentos anticoagulantes y tiene ms moretones.  Se cansa con ms facilidad cuando se mueve o hace ejercicio.  Tiene un cambio repentino [employer] Solicite ayuda de inmediato si:   Siente dolor en el pecho o en la barriga (abdomen).  Tiene dificultad para respirar.  Tiene efectos secundarios de los anticoagulantes, como sangre en el vmito, la materia fecal (heces) o el pis (orina), o sangrado que no puede  detenerse.  Tiene algn signo de accidente cerebrovascular. "BE FAST" es una manera fcil de recordar las principales seales de advertencia: ? B - Balance (equilibrio). Los signos son mareos, dificultad repentina para caminar o prdida del equilibrio. ? E - Eyes (ojos). Los signos son dificultad para ver o un cambio en la visin. ? F - Face (rostro).  Los signos son debilidad repentina o prdida de la sensibilidad en la cara, o que la cara o el prpado se caigan hacia un lado. ? A - Arms (brazos). Los signos son debilidad o prdida de la sensibilidad en un brazo. Esto sucede de repente y generalmente en un lado del cuerpo. ? S - Speech (habla). Los signos son dificultad para hablar, hablar arrastrando las palabras o dificultad para comprender lo que la gente dice. ? T - Time (tiempo). Es tiempo de llamar al servicio de Sports administrator. Anote la hora a la que [employer] sntomas.  Presenta otros signos de un accidente cerebrovascular, como los siguientes: ? Dolor de [place] repentino y muy intenso sin causa aparente. ? Ganas de vomitar (nuseas). ? Vmitos. ? Una convulsin. Estos sntomas pueden Customer service manager. No espere a ver si los sntomas desaparecen. Solicite atencin mdica de inmediato. Comunquese con el servicio de emergencias de su localidad (911 en los [place]). No conduzca por sus propios medios [employer]. Resumen  La fibrilacin auricular es un tipo de latido cardaco irregular o rpido.  Est expuesto a un riesgo mayor de sufrir esta afeccin si fuma, es una persona de edad avanzada, tiene diabetes o tiene sobrepeso.  Siga las instrucciones de su mdico [employer], la dieta, el ejercicio y las visitas de seguimiento.  Obtenga ayuda de inmediato si tiene signos o sntomas de un accidente cerebrovascular.  Busque ayuda de inmediato si no puede respirar o siente dolor o Dentist. Esta informacin no tiene Theme park manager el  consejo del mdico. Asegrese de hacerle al mdico cualquier pregunta que tenga. Document Revised: 02/15/2019 Document Reviewed: 02/15/2019 Elsevier Patient Education  2020 [employer].

## 2019-11-01 ENCOUNTER — Other Ambulatory Visit (INDEPENDENT_AMBULATORY_CARE_PROVIDER_SITE_OTHER): Payer: Medicare HMO

## 2019-11-01 DIAGNOSIS — I10 Essential (primary) hypertension: Secondary | ICD-10-CM

## 2019-11-03 ENCOUNTER — Other Ambulatory Visit: Payer: Self-pay

## 2019-11-03 ENCOUNTER — Ambulatory Visit
Admission: RE | Admit: 2019-11-03 | Discharge: 2019-11-03 | Disposition: A | Discharge status: Home / Self Care | Payer: Medicare HMO | Source: Ambulatory Visit | Attending: Family Medicine | Admitting: Family Medicine

## 2019-11-03 DIAGNOSIS — M8589 Other specified disorders of bone density and structure, multiple sites: Secondary | ICD-10-CM | POA: Diagnosis not present

## 2019-11-03 DIAGNOSIS — Z780 Asymptomatic menopausal state: Secondary | ICD-10-CM

## 2019-11-30 DIAGNOSIS — H11043 Peripheral pterygium, stationary, bilateral: Secondary | ICD-10-CM | POA: Diagnosis not present

## 2019-11-30 DIAGNOSIS — E119 Type 2 diabetes mellitus without complications: Secondary | ICD-10-CM | POA: Diagnosis not present

## 2019-11-30 DIAGNOSIS — H40013 Open angle with borderline findings, low risk, bilateral: Secondary | ICD-10-CM | POA: Diagnosis not present

## 2019-11-30 DIAGNOSIS — H31012 Macula scars of posterior pole (postinflammatory) (post-traumatic), left eye: Secondary | ICD-10-CM | POA: Diagnosis not present

## 2019-11-30 DIAGNOSIS — H2513 Age-related nuclear cataract, bilateral: Secondary | ICD-10-CM | POA: Diagnosis not present

## 2019-11-30 LAB — HM DIABETES EYE EXAM

## 2020-01-03 ENCOUNTER — Telehealth: Payer: Self-pay | Admitting: *Deleted

## 2020-01-03 ENCOUNTER — Encounter: Payer: Self-pay | Admitting: Internal Medicine

## 2020-01-03 ENCOUNTER — Telehealth (INDEPENDENT_AMBULATORY_CARE_PROVIDER_SITE_OTHER): Payer: Medicare HMO | Admitting: Internal Medicine

## 2020-01-03 VITALS — Ht <= 58 in

## 2020-01-03 DIAGNOSIS — I4821 Permanent atrial fibrillation: Secondary | ICD-10-CM | POA: Diagnosis not present

## 2020-01-03 DIAGNOSIS — R072 Precordial pain: Secondary | ICD-10-CM | POA: Diagnosis not present

## 2020-01-03 DIAGNOSIS — I10 Essential (primary) hypertension: Secondary | ICD-10-CM

## 2020-01-03 DIAGNOSIS — D6869 Other thrombophilia: Secondary | ICD-10-CM

## 2020-01-03 DIAGNOSIS — E119 Type 2 diabetes mellitus without complications: Secondary | ICD-10-CM | POA: Diagnosis not present

## 2020-01-03 DIAGNOSIS — E785 Hyperlipidemia, unspecified: Secondary | ICD-10-CM

## 2020-01-03 DIAGNOSIS — Z79899 Other long term (current) drug therapy: Secondary | ICD-10-CM | POA: Diagnosis not present

## 2020-01-03 MED ORDER — ISOSORBIDE MONONITRATE ER 30 MG PO TB24: 15.0000 mg | ORAL_TABLET | Freq: Every day | ORAL | 3 refills | Status: DC

## 2020-01-03 NOTE — Patient Instructions (Addendum)
Needs refill of imdur.  Medication Instructions:   Ok to stop aspirin. Isosorbide Mononitrate 15 MG ( 1/2 TABLET OF 30 MG) DAILY   *If you need a refill on your cardiac medications before your next appointment, please call your pharmacy*   Lab Work: NOT NEEDED .   Testing/Procedures: NOT NEEDED   Follow-Up: At [facility], you and your health needs are our priority.  As part of our continuing mission to provide you with exceptional heart care, we have created designated Provider Care Teams.  These Care Teams include your primary Cardiologist (physician) and Advanced Practice Providers (APPs -  Physician Assistants and Nurse Practitioners) who all work together to provide you with the care you need, when you need it.  We recommend signing up for the patient portal called "MyChart".  Sign up information is provided on this After Visit Summary.  MyChart is used to connect with patients for Virtual Visits (Telemedicine).  Patients are able to view lab/test results, encounter notes, upcoming appointments, etc.  Non-urgent messages can be sent to your provider as well.   To learn more about what you can do with MyChart, go to [email or web address].    Your next appointment:   6 month(s)  The format for your next appointment:   In Person- NEEDS INTREPRETER  Provider:   Weston Brass, MD

## 2020-01-03 NOTE — Telephone Encounter (Signed)
RN spoke to patient with assistance of interpreter from [facility] Byrd Hesselbach ID # [phone]. Instruction were given  from today's virtual visit 01/03/20.  E-sent refilled medication to pharmacy    AVS SUMMARY has been sent by mail .   appointment  Schedule Dec 15,2021 at 11:40 am  Patient verbalized understanding

## 2020-01-03 NOTE — Progress Notes (Signed)
Virtual Visit via Telephone Note   This visit type was conducted due to national recommendations for restrictions regarding the COVID-19 Pandemic (e.g. social distancing) in an effort to limit this patient's exposure and mitigate transmission in our community.  Due to her co-morbid illnesses, this patient is at least at moderate risk for complications without adequate follow up.  This format is felt to be most appropriate for this patient at this time.  The patient did not have access to video technology/had technical difficulties with video requiring transitioning to audio format only (telephone).  All issues noted in this document were discussed and addressed.  No physical exam could be performed with this format.  Please refer to the patient's chart for her  consent to telehealth for [facility].   The patient was identified using 2 identifiers.  Date:  01/03/2020   ID:  Emily Hamilton, DOB 1945/12/26, MRN [id number]  Patient Location: Home Provider Location: Home Office  PCP:  Antony Blackbird, MD  Cardiologist:  Elouise Munroe, MD  Electrophysiologist:  None   Evaluation Performed:  Follow-Up Visit  Chief Complaint:  afib  History of Present Illness:    Emily Hamilton is a 74 y.o. female with DM, HLD, HTN, chest pain and permanent atrial fibrillation. Visit assisted by telephone Spanish interpreter.  She is not currently taking imdur or ASA. Ok to stop aspirin. We discussed going back on Imdur, no clear reason for stopping.   No significant chest pain. Not bothered by palpitations.  The patient does not have symptoms concerning for COVID-19 infection (fever, chills, cough, or new shortness of breath).    Past Medical History:  Diagnosis Date  . Controlled type 2 diabetes mellitus ([place])   . Family history of diabetes mellitus   . Hyperlipidemia   . Osteopenia   . Status post right knee replacement    Past Surgical History:  Procedure Laterality Date    . BREAST EXCISIONAL BIOPSY Left   . CHOLECYSTECTOMY    . Right knee replacement       Current Meds  Medication Sig  . apixaban (ELIQUIS) 5 MG TABS tablet Take 1 tablet (5 mg total) by mouth 2 (two) times daily.  Marland Kitchen atorvastatin (LIPITOR) 40 MG tablet TOME UNA TABLETA TODOS LOS DIAS  . hydrochlorothiazide (MICROZIDE) 12.5 MG capsule TOME UNA CAPSULA TODOS LOS DIAS  . lisinopril (ZESTRIL) 5 MG tablet Take 1 tablet (5 mg total) by mouth daily. Must have office visit for refills  . metFORMIN (GLUCOPHAGE) 500 MG tablet TAKE 2 TABLETS (1,000 MG TOTAL) BY MOUTH 2 (TWO) TIMES DAILY WITH A MEAL.     Allergies:   Patient has no known allergies.   Social History   Tobacco Use  . Smoking status: Never Smoker  . Smokeless tobacco: Never Used  Vaping Use  . Vaping Use: Never used  Substance Use Topics  . Alcohol use: Never  . Drug use: Never     Family Hx: The patient's family history includes Diabetes in her mother; Glaucoma in her mother.  ROS:   Please see the history of present illness.     All other systems reviewed and are negative.   Prior CV studies:   The following studies were reviewed today:    Labs/Other Tests and Data Reviewed:    EKG:  No ECG reviewed.  Recent Labs: 08/05/2019: ALT 16; BUN 9; Creatinine, Ser 0.67; Hemoglobin 12.9; Platelets 191; Potassium 4.1; Sodium 140   Recent Lipid  Panel Lab Results  Component Value Date/Time   CHOL 155 08/05/2019 10:49 AM   TRIG 200 (H) 08/05/2019 10:49 AM   HDL 53 08/05/2019 10:49 AM   CHOLHDL 2.9 08/05/2019 10:49 AM   LDLCALC 69 08/05/2019 10:49 AM    Wt Readings from Last 3 Encounters:  10/24/19 165 lb (74.8 kg)  09/08/19 163 lb (73.9 kg)  09/05/19 163 lb (73.9 kg)     Objective:    Vital Signs:  Ht 4\' 7"  (1.397 m)   BMI 38.35 kg/m    VITAL SIGNS:  reviewed GEN:  no acute distress RESPIRATORY:  normal respiratory effort, no increased work of breathing NEURO:  alert and oriented x 3, speech  normal PSYCH:  normal affect  ASSESSMENT & PLAN:    1. Permanent atrial fibrillation (HCC)   2. Secondary hypercoagulable state (HCC)   3. Essential hypertension   4. Precordial pain   5. Hyperlipidemia LDL goal <70   6. Type 2 diabetes mellitus without complication, without long-term current use of insulin (HCC)   7. Medication management    Permanent Afib - continue eliquis. Rate well controlled without rate control. She has not been taking aspirin, ok to stop in setting of using eliquis.  HTN - continue lisinopril, HCTZ. Resume Imdur, previously toleraing well.  HLD - continue atorvastatin. LDL optimized, triglycerides elevated, likely related to DM2  DM2 - metformin per PCP.   COVID-19 Education: The signs and symptoms of COVID-19 were discussed with the patient and how to seek care for testing (follow up with PCP or arrange E-visit).  The importance of social distancing was discussed today.  Time:   Today, I have spent 20 minutes with the patient with telehealth technology discussing the above problems.     Medication Adjustments/Labs and Tests Ordered: Current medicines are reviewed at length with the patient today.  Concerns regarding medicines are outlined above.   Tests Ordered: No orders of the defined types were placed in this encounter.   Medication Changes: No orders of the defined types were placed in this encounter.   Follow Up:  In Person in 6 month(s) with interpreter.  Signed, , MD  01/03/2020 9:39 AM    [facility]

## 2020-01-03 NOTE — Telephone Encounter (Signed)
  Patient Consent for Virtual Visit         Emily Hamilton has provided verbal consent on 01/03/2020 for a virtual visit (video or telephone).   CONSENT FOR VIRTUAL VISIT FOR:  Emily Hamilton  By participating in this virtual visit I agree to the following:  I hereby voluntarily request, consent and authorize [facility] and its employed or contracted physicians, physician assistants, nurse practitioners or other licensed health care professionals (the Practitioner), to provide me with telemedicine health care services (the "Services") as deemed necessary by the treating Practitioner. I acknowledge and consent to receive the Services by the Practitioner via telemedicine. I understand that the telemedicine visit will involve communicating with the Practitioner through live audiovisual communication technology and the disclosure of certain medical information by electronic transmission. I acknowledge that I have been given the opportunity to request an in-person assessment or other available alternative prior to the telemedicine visit and am voluntarily participating in the telemedicine visit.  I understand that I have the right to withhold or withdraw my consent to the use of telemedicine in the course of my care at any time, without affecting my right to future care or treatment, and that the Practitioner or I may terminate the telemedicine visit at any time. I understand that I have the right to inspect all information obtained and/or recorded in the course of the telemedicine visit and may receive copies of available information for a reasonable fee.  I understand that some of the potential risks of receiving the Services via telemedicine include:  Marland Kitchen Delay or interruption in medical evaluation due to technological equipment failure or disruption; . Information transmitted may not be sufficient (e.g. poor resolution of images) to allow for appropriate medical decision making by the  Practitioner; and/or  . In rare instances, security protocols could fail, causing a breach of personal health information.  Furthermore, I acknowledge that it is my responsibility to provide information about my medical history, conditions and care that is complete and accurate to the best of my ability. I acknowledge that Practitioner's advice, recommendations, and/or decision may be based on factors not within their control, such as incomplete or inaccurate data provided by me or distortions of diagnostic images or specimens that may result from electronic transmissions. I understand that the practice of medicine is not an exact science and that Practitioner makes no warranties or guarantees regarding treatment outcomes. I acknowledge that a copy of this consent can be made available to me via my patient portal [facility] MyChart), or I can request a printed copy by calling the office of [facility].    I understand that my insurance will be billed for this visit.   I have read or had this consent read to me. . I understand the contents of this consent, which adequately explains the benefits and risks of the Services being provided via telemedicine.  . I have been provided ample opportunity to ask questions regarding this consent and the Services and have had my questions answered to my satisfaction. . I give my informed consent for the services to be provided through the use of telemedicine in my medical care

## 2020-03-01 ENCOUNTER — Other Ambulatory Visit: Payer: Self-pay | Admitting: Family Medicine

## 2020-03-01 DIAGNOSIS — E119 Type 2 diabetes mellitus without complications: Secondary | ICD-10-CM

## 2020-03-01 NOTE — Telephone Encounter (Signed)
Requested Prescriptions  Pending Prescriptions Disp Refills   atorvastatin (LIPITOR) 40 MG tablet [Pharmacy Med Name: ATORVASTATIN 40 MG TABLET] 90 tablet 1    Sig: TOME UNA TABLETA TODOS LOS DIAS     Cardiovascular:  Antilipid - Statins Failed - 03/01/2020  1:48 AM      Failed - LDL in normal range and within 360 days    LDL Chol Calc (NIH)  Date Value Ref Range Status  08/05/2019 69 0 - 99 mg/dL Final         Failed - Triglycerides in normal range and within 360 days    Triglycerides  Date Value Ref Range Status  08/05/2019 200 (H) 0 - 149 mg/dL Final         Passed - Total Cholesterol in normal range and within 360 days    Cholesterol, Total  Date Value Ref Range Status  08/05/2019 155 100 - 199 mg/dL Final         Passed - HDL in normal range and within 360 days    HDL  Date Value Ref Range Status  08/05/2019 53 >39 mg/dL Final         Passed - Patient is not pregnant      Passed - Valid encounter within last 12 months    Recent Outpatient Visits          6 months ago Type 2 diabetes mellitus without complication, without long-term current use of insulin (HCC)   [facility] [place], [place], [place]   1 year ago Type 2 diabetes mellitus without complication, without long-term current use of insulin (HCC)   [facility] [place], [place], [place]   1 year ago Dysuria   [facility] [place], [place], [place]   1 year ago Dysuria   [facility] [place], [place], [place]   1 year ago Type 2 diabetes mellitus without complication, without long-term current use of insulin [facility])   [facility] [place], Marzella [place], [place]      Future Appointments            In 2 weeks Rema Fendt, NP [facility]   In 3 months Jacques Navy, Gennie Alma, MD [facility] [place], [facility]

## 2020-03-06 ENCOUNTER — Other Ambulatory Visit: Payer: Self-pay | Admitting: Family Medicine

## 2020-03-06 DIAGNOSIS — I10 Essential (primary) hypertension: Secondary | ICD-10-CM

## 2020-03-06 NOTE — Telephone Encounter (Signed)
Requested Prescriptions  Pending Prescriptions Disp Refills   hydrochlorothiazide (MICROZIDE) 12.5 MG capsule [Pharmacy Med Name: HYDROCHLOROTHIAZIDE 12.5 MG CP] 90 capsule 0    Sig: TOME UNA CAPSULA TODOS LOS DIAS     Cardiovascular: Diuretics - Thiazide Failed - 03/06/2020  1:26 AM      Failed - Valid encounter within last 6 months    Recent Outpatient Visits          7 months ago Type 2 diabetes mellitus without complication, without long-term current use of insulin (HCC)   [facility] [place], [place], [place]   1 year ago Type 2 diabetes mellitus without complication, without long-term current use of insulin (HCC)   [place] [place], [place], [place]   1 year ago Dysuria   [facility] [place], [place], [place]   1 year ago Dysuria   [place] [facility] [place], [place], [place]   1 year ago Type 2 diabetes mellitus without complication, without long-term current use of insulin (HCC)   [place] [facility] [place], [place], [place]      Future Appointments            In 1 week Rema Fendt, NP [facility]   In 3 months Jacques Navy, Gennie Alma, MD [facility], [facility]           Passed - Ca in normal range and within 360 days    Calcium  Date Value Ref Range Status  08/05/2019 9.4 8.7 - 10.3 mg/dL Final         Passed - Cr in normal range and within 360 days    Creatinine, Ser  Date Value Ref Range Status  08/05/2019 0.67 0.57 - 1.00 mg/dL Final         Passed - K in normal range and within 360 days    Potassium  Date Value Ref Range Status  08/05/2019 4.1 3.5 - 5.2 mmol/L Final         Passed - Na in normal range and within 360 days    Sodium  Date Value Ref Range Status  08/05/2019 140 134 - 144 mmol/L Final         Passed - Last BP in normal range    BP Readings from Last 1 Encounters:  10/24/19 120/66          90 day courtesy refill provided per protocol.

## 2020-03-11 ENCOUNTER — Other Ambulatory Visit: Payer: Self-pay | Admitting: Internal Medicine

## 2020-03-14 ENCOUNTER — Other Ambulatory Visit: Payer: Self-pay | Admitting: Family Medicine

## 2020-03-14 DIAGNOSIS — E119 Type 2 diabetes mellitus without complications: Secondary | ICD-10-CM

## 2020-03-14 MED ORDER — METFORMIN HCL 500 MG PO TABS: 1000.0000 mg | ORAL_TABLET | Freq: Two times a day (BID) | ORAL | 0 refills | Status: DC

## 2020-03-14 NOTE — Telephone Encounter (Signed)
Medication Refill - Medication:  metFORMIN (GLUCOPHAGE) 500 MG tablet [[id number]]    Has the patient contacted their pharmacy? No. (Agent: If no, request that the patient contact the pharmacy for the refill.) (Agent: If yes, when and what did the pharmacy advise?)  Preferred Pharmacy (with phone number or street name[facility] [address]  Agent: Please be advised that RX refills may take up to 3 business days. We ask that you follow-up with your pharmacy.

## 2020-03-16 ENCOUNTER — Ambulatory Visit: Payer: Medicare HMO | Attending: Family | Admitting: Family

## 2020-03-16 ENCOUNTER — Encounter: Payer: Self-pay | Admitting: Family

## 2020-03-16 ENCOUNTER — Other Ambulatory Visit: Payer: Self-pay

## 2020-03-16 DIAGNOSIS — Z1231 Encounter for screening mammogram for malignant neoplasm of breast: Secondary | ICD-10-CM | POA: Diagnosis not present

## 2020-03-16 DIAGNOSIS — Z789 Other specified health status: Secondary | ICD-10-CM | POA: Diagnosis not present

## 2020-03-16 NOTE — Progress Notes (Signed)
Virtual Visit via Video Note  I connected with Emily Hamilton, on 03/16/2020 at 7:40 AM by telephone due to the COVID-19 pandemic and verified that I am speaking with the correct person using two identifiers.  Due to current restrictions/limitations of in-office visits due to the COVID-19 pandemic, this scheduled clinical appointment was converted to a telehealth visit.   Consent: I discussed the limitations, risks, security and privacy concerns of performing an evaluation and management service by telephone and the availability of in person appointments. I also discussed with the patient that there may be a patient responsible charge related to this service. The patient expressed understanding and agreed to proceed.  Location of Patient: Home  [facility]  Persons participating in Telemedicine visit: Feleshia Zundel, NP Eloise Levels, RN   History of Present Illness: Emily Hamilton. Emily Hamilton is a 74 year old female with history of  type 2 diabetes mellitus without complication without long-term use of insulin, osteopenia, and hyperlipidemia who presents today for mammogram. Last mammogram 08/26/2019 and normal with recommendation to repeat mammogram in 1 year. Today patient reports she has no concerns related to breasts.  Past Medical History:  Diagnosis Date  . Controlled type 2 diabetes mellitus (HCC)   . Family history of diabetes mellitus   . Hyperlipidemia   . Osteopenia   . Status post right knee replacement    No Known Allergies  Current Outpatient Medications on File Prior to Visit  Medication Sig Dispense Refill  . apixaban (ELIQUIS) 5 MG TABS tablet Take 1 tablet (5 mg total) by mouth 2 (two) times daily. 60 tablet 6  . atorvastatin (LIPITOR) 40 MG tablet TOME UNA TABLETA TODOS LOS DIAS 90 tablet 1  . hydrochlorothiazide (MICROZIDE) 12.5 MG capsule TOME UNA CAPSULA TODOS LOS DIAS 90 capsule 0  .  isosorbide mononitrate (IMDUR) 30 MG 24 hr tablet Take 0.5 tablets (15 mg total) by mouth daily. 45 tablet 3  . lisinopril (ZESTRIL) 5 MG tablet Take 1 tablet (5 mg total) by mouth daily. Must have office visit for refills 30 tablet 0  . metFORMIN (GLUCOPHAGE) 500 MG tablet Take 2 tablets (1,000 mg total) by mouth 2 (two) times daily with a meal. 120 tablet 0   No current facility-administered medications on file prior to visit.    Observations/Objective: Alert and oriented x 3. Not in acute distress. Physical examination not completed as this is a telemedicine visit.  Assessment and Plan: 1. Encounter for screening mammogram for malignant neoplasm of breast: - Last mammogram obtained 08/26/2019 and normal with recommendation to repeat mammogram in 1 year.  - Today patient reports no concerns with breasts.  - Referral for mammogram as recommended and for further evaluation. - Follow-up with primary physician as needed. - MM Digital Screening; Future  2. Language barrier: Merchant navy officer participated during this visit.  - Interpreter Name: Doreene Burke, ID#: [id number]  Follow Up Instructions: Referral for mammogram.   Patient was given clear instructions to go to Emergency Department or return to medical center if symptoms don't improve, worsen, or new problems develop.The patient verbalized understanding.  I discussed the assessment and treatment plan with the patient. The patient was provided an opportunity to ask questions and all were answered. The patient agreed with the plan and demonstrated an understanding of the instructions.   The patient was advised to call back or seek an in-person evaluation if the symptoms worsen or if the condition  fails to improve as anticipated.   I provided 15 minutes total of non-face-to-face time during this encounter including median intraservice time, reviewing previous notes, labs, imaging, medications, management and patient verbalized  understanding.    Rema Fendt, NP  [facility] [place], [place] [phone]   03/16/2020, 7:40 AM

## 2020-03-16 NOTE — Patient Instructions (Addendum)
Referral for mammogram.  Remisin para mamografa.  Mamografa Mammogram [employer] es un radiografa de las mamas que se realiza para determinar si hay cambios que no son normales. Este estudio permite explorar y Clinical research associate cualquier cambio que pudiera sugerir la presencia de cncer de mama. Las [employer] Corporation se Scientist, product/process development en las mujeres. Un hombre puede hacerse una mamografa si tiene un bulto o hinchazn en la mama. Tambin puede ayudar a identificar otros cambios y variaciones en las mamas. Informe al mdico:  Acerca de cualquier alergia que tenga.  Si tiene implantes mamarios.  Si ha tenido enfermedades, biopsias o cirugas previas de Educational psychologist.  Si est amamantando.  Si es menor de 25aos.  Si tiene antecedentes familiares de cncer de mama.  Si est embarazada o podra estarlo. Cules son los riesgos? En general, se trata de un procedimiento seguro. Sin embargo, pueden ocurrir complicaciones, por ejemplo:  Exposicin a la radiacin. En [employer], los [place] de radiacin son muy bajos.  La interpretacin Mirant.  La necesidad de Education officer, environmental ms [employer].  La imposibilidad de la mamografa de Engineer, manufacturing algunos tipos de cncer. Qu ocurre antes del procedimiento?  Hgase este estudio aproximadamente 1 o 2semanas despus de la [place]. Generalmente, este es el momento en que las mamas estn menos sensibles.  Si consulta a un mdico nuevo o cambia de Financial risk analyst, enve las Ball Corporation anteriores al [employer].  El da del estudio, [address] las [place] y [place].  No use desodorantes, perfumes, lociones o talcos el da del estudio.  Qutese las alhajas del cuello.  Use prendas que pueda ponerse y sacarse fcilmente. Qu ocurre durante el procedimiento?   Se quitar la ropa de la cintura para [place]. Se colocar una bata.  Debe permanecer de pie delante de la mquina de rayos-X.  Se colocar cada mama entre dos placas de  vidrio o de plstico. Las placas comprimirn las mamas durante unos segundos. Intente estar lo ms relajada posible. Esto no causa ningn dao a las mamas. Si siente alguna molestia, ser pasajera.  Se tomarn radiografas desde diferentes ngulos de cada mama. Este procedimiento puede variar segn el mdico y el hospital. Ladell Heads ocurre despus del procedimiento?  Ileene Rubens ser leda por un especialista (radilogo).  Tal vez deba repetir Apple Computer del estudio. Esto depende de la calidad de las imgenes.  Pregunte la fecha en que los resultados estarn disponibles. Asegrese de Starbucks Corporation.  Puede volver a sus actividades habituales. Resumen  Thereasa Solo es un radiografa de baja energa de las mamas que se realiza para determinar si hay cambios anormales. Un hombre puede Commercial Metals Company examen si tiene un bulto o hinchazn en la mama.  Antes del procedimiento, informe al mdico sobre cualquier problema en las mamas que haya tenido en el pasado.  Hgase este estudio aproximadamente 1 o 2semanas despus de la [place].  Para el examen, se colocar cada mama entre dos placas de vidrio o de plstico. Las placas comprimirn las mamas durante unos segundos.  Ileene Rubens ser leda por un especialista (radilogo). Pregunte la fecha en que los resultados estarn disponibles. Asegrese de Starbucks Corporation. Esta informacin no tiene Theme park manager el consejo del mdico. Asegrese de hacerle al mdico cualquier pregunta que tenga. Document Revised: 03/30/2018 Document Reviewed: 03/30/2018 Elsevier Patient Education  2020 [employer].

## 2020-03-16 NOTE — Progress Notes (Signed)
Needs mammogram and refills on HCTZ and metformin

## 2020-03-28 ENCOUNTER — Ambulatory Visit: Payer: Medicare HMO

## 2020-03-30 ENCOUNTER — Other Ambulatory Visit: Payer: Self-pay | Admitting: Family Medicine

## 2020-03-30 DIAGNOSIS — I10 Essential (primary) hypertension: Secondary | ICD-10-CM

## 2020-03-30 MED ORDER — LISINOPRIL 5 MG PO TABS: 5.0000 mg | ORAL_TABLET | Freq: Every day | ORAL | 0 refills | Status: DC

## 2020-04-02 ENCOUNTER — Telehealth: Payer: Self-pay | Admitting: Family Medicine

## 2020-04-02 NOTE — Telephone Encounter (Signed)
Appt has been scheduled.   Copied from CRM [id number]. Topic: General - Other >> Mar 30, 2020  9:56 AM Jaquita Rector A wrote: Reason for CRM: Patient would like a call back from Dr Jillyn Hidden nurse to schedule an appointment for her Physical first thing in the morning also completely out of apixaban (ELIQUIS) 5 MG TABS tablet. Please call Ph# [phone]

## 2020-04-03 MED ORDER — APIXABAN 5 MG PO TABS: 5.0000 mg | ORAL_TABLET | Freq: Two times a day (BID) | ORAL | 6 refills | Status: DC

## 2020-04-03 NOTE — Telephone Encounter (Signed)
Rx sent 

## 2020-04-13 ENCOUNTER — Ambulatory Visit
Admission: RE | Admit: 2020-04-13 | Discharge: 2020-04-13 | Disposition: A | Discharge status: Home / Self Care | Payer: Medicare HMO | Source: Ambulatory Visit | Attending: Family | Admitting: Family

## 2020-04-13 ENCOUNTER — Other Ambulatory Visit: Payer: Self-pay

## 2020-04-13 DIAGNOSIS — Z1231 Encounter for screening mammogram for malignant neoplasm of breast: Secondary | ICD-10-CM | POA: Diagnosis not present

## 2020-04-14 ENCOUNTER — Other Ambulatory Visit: Payer: Self-pay | Admitting: Family Medicine

## 2020-04-14 DIAGNOSIS — E119 Type 2 diabetes mellitus without complications: Secondary | ICD-10-CM

## 2020-04-14 NOTE — Telephone Encounter (Signed)
Requested Prescriptions  Pending Prescriptions Disp Refills  . metFORMIN (GLUCOPHAGE) 500 MG tablet [Pharmacy Med Name: METFORMIN HCL 500 MG TABLET] 120 tablet 0    Sig: TAKE 2 TABLETS (1,000 MG TOTAL) BY MOUTH 2 (TWO) TIMES DAILY WITH A MEAL.     Endocrinology:  Diabetes - Biguanides Failed - 04/14/2020  9:31 AM      Failed - HBA1C is between 0 and 7.9 and within 180 days    HbA1c, POC (prediabetic range)  Date Value Ref Range Status  09/08/2018 6.4 5.7 - 6.4 % Final   HbA1c, POC (controlled diabetic range)  Date Value Ref Range Status  08/05/2019 5.9 0.0 - 7.0 % Final         Passed - Cr in normal range and within 360 days    Creatinine, Ser  Date Value Ref Range Status  08/05/2019 0.67 0.57 - 1.00 mg/dL Final         Passed - eGFR in normal range and within 360 days    GFR calc Af Amer  Date Value Ref Range Status  08/05/2019 102 >59 mL/min/1.73 Final   GFR calc non Af Amer  Date Value Ref Range Status  08/05/2019 89 >59 mL/min/1.73 Final         Passed - Valid encounter within last 6 months    Recent Outpatient Visits          4 weeks ago Encounter for screening mammogram for malignant neoplasm of breast   [place], [place], NP   8 months ago Type 2 diabetes mellitus without complication, without long-term current use of insulin ([place])   [place] Fulp, Lavinia, MD   1 year ago Type 2 diabetes mellitus without complication, without long-term current use of insulin ([place])   [place] Fulp, [place], MD   1 year ago [place], [place], [place]   1 year ago [place], [place], MD      Future Appointments            In 2 weeks Antony Blackbird, MD [place]   In 2 months Elouise Munroe, MD [place], [facility]

## 2020-04-18 ENCOUNTER — Telehealth: Payer: Self-pay

## 2020-04-18 NOTE — Progress Notes (Signed)
Please call patient with update.   No evidence of malignancy and repeat in 1 year.

## 2020-04-18 NOTE — Telephone Encounter (Signed)
[employer] interpre[employer] Emily Hamilton  Id# [id number]  contacted pt to go over mm results pt is aware and doesn't have any questions or concerns  ° °

## 2020-04-24 ENCOUNTER — Other Ambulatory Visit: Payer: Self-pay | Admitting: Family Medicine

## 2020-04-24 DIAGNOSIS — I10 Essential (primary) hypertension: Secondary | ICD-10-CM

## 2020-04-24 NOTE — Telephone Encounter (Signed)
Appointment scheduled- 05/03/20- RF granted #[phone]

## 2020-05-02 ENCOUNTER — Ambulatory Visit: Payer: Medicare HMO | Admitting: Family Medicine

## 2020-05-03 ENCOUNTER — Ambulatory Visit: Payer: Medicare HMO | Admitting: Family Medicine

## 2020-05-11 ENCOUNTER — Other Ambulatory Visit: Payer: Self-pay | Admitting: Family Medicine

## 2020-05-11 DIAGNOSIS — E119 Type 2 diabetes mellitus without complications: Secondary | ICD-10-CM

## 2020-05-17 ENCOUNTER — Other Ambulatory Visit: Payer: Self-pay | Admitting: Family Medicine

## 2020-05-17 DIAGNOSIS — I10 Essential (primary) hypertension: Secondary | ICD-10-CM

## 2020-05-18 ENCOUNTER — Other Ambulatory Visit: Payer: Self-pay | Admitting: Family Medicine

## 2020-05-18 DIAGNOSIS — E119 Type 2 diabetes mellitus without complications: Secondary | ICD-10-CM

## 2020-05-28 ENCOUNTER — Ambulatory Visit: Payer: Medicare HMO | Attending: Family Medicine | Admitting: Family Medicine

## 2020-05-28 ENCOUNTER — Encounter: Payer: Self-pay | Admitting: Family Medicine

## 2020-05-28 ENCOUNTER — Ambulatory Visit: Payer: Medicare HMO

## 2020-05-28 ENCOUNTER — Other Ambulatory Visit: Payer: Self-pay

## 2020-05-28 VITALS — BP 120/76 | HR 81 | Ht <= 58 in | Wt 162.6 lb

## 2020-05-28 DIAGNOSIS — R10819 Abdominal tenderness, unspecified site: Secondary | ICD-10-CM

## 2020-05-28 DIAGNOSIS — Z0000 Encounter for general adult medical examination without abnormal findings: Secondary | ICD-10-CM | POA: Diagnosis not present

## 2020-05-28 DIAGNOSIS — Z7902 Long term (current) use of antithrombotics/antiplatelets: Secondary | ICD-10-CM

## 2020-05-28 DIAGNOSIS — R202 Paresthesia of skin: Secondary | ICD-10-CM | POA: Diagnosis not present

## 2020-05-28 DIAGNOSIS — I10 Essential (primary) hypertension: Secondary | ICD-10-CM

## 2020-05-28 DIAGNOSIS — M8580 Other specified disorders of bone density and structure, unspecified site: Secondary | ICD-10-CM

## 2020-05-28 DIAGNOSIS — Z79899 Other long term (current) drug therapy: Secondary | ICD-10-CM | POA: Diagnosis not present

## 2020-05-28 DIAGNOSIS — Z23 Encounter for immunization: Secondary | ICD-10-CM

## 2020-05-28 DIAGNOSIS — R108A3 Suprapubic tenderness: Secondary | ICD-10-CM

## 2020-05-28 DIAGNOSIS — E119 Type 2 diabetes mellitus without complications: Secondary | ICD-10-CM

## 2020-05-28 DIAGNOSIS — E785 Hyperlipidemia, unspecified: Secondary | ICD-10-CM

## 2020-05-28 LAB — POCT URINALYSIS DIP (CLINITEK)
Bilirubin, UA: NEGATIVE
Blood, UA: NEGATIVE
Glucose, UA: NEGATIVE mg/dL
Ketones, POC UA: NEGATIVE mg/dL
Nitrite, UA: NEGATIVE
POC PROTEIN,UA: NEGATIVE
Spec Grav, UA: >=1.03 — AB
Urobilinogen, UA: 0.2 U/dL
pH, UA: 6

## 2020-05-28 MED ORDER — ATORVASTATIN CALCIUM 40 MG PO TABS: ORAL_TABLET | ORAL | 1 refills | Status: DC

## 2020-05-28 MED ORDER — SULFAMETHOXAZOLE-TRIMETHOPRIM 800-160 MG PO TABS: 1.0000 | ORAL_TABLET | Freq: Two times a day (BID) | ORAL | 0 refills | Status: DC

## 2020-05-28 MED ORDER — METFORMIN HCL 500 MG PO TABS: 1000.0000 mg | ORAL_TABLET | Freq: Two times a day (BID) | ORAL | 1 refills | Status: DC

## 2020-05-28 MED ORDER — HYDROCHLOROTHIAZIDE 12.5 MG PO CAPS: ORAL_CAPSULE | ORAL | 3 refills | Status: DC

## 2020-05-28 MED ORDER — LISINOPRIL 5 MG PO TABS: 5.0000 mg | ORAL_TABLET | Freq: Every day | ORAL | 1 refills | Status: DC

## 2020-05-28 MED ORDER — GABAPENTIN 100 MG PO CAPS: 100.0000 mg | ORAL_CAPSULE | Freq: Every day | ORAL | 3 refills | Status: DC

## 2020-05-28 MED ORDER — ISOSORBIDE MONONITRATE ER 30 MG PO TB24: 15.0000 mg | ORAL_TABLET | Freq: Every day | ORAL | 3 refills | Status: DC

## 2020-05-28 NOTE — Patient Instructions (Signed)
Osteopenia Osteopenia  La osteopenia es la prdida de grosor (densidad) en el interior de [place]. Otra nombre para la osteopenia es una masa sea baja. La osteopenia leve es una parte normal del envejecimiento. No es una enfermedad y no causa sntomas. Sin embargo, si tiene osteopenia y contina perdiendo masa sea, puede desarrollar una afeccin que hace que los huesos se vuelvan delgados y se fracturen con ms facilidad (osteoporosis). Tambin puede perder parte de la altura, tener dolor de espalda y Winferd Humphrey postura encorvada. Aunque la osteopenia no es una enfermedad, realizar cambios en el estilo de vida y la dieta puede ayudar a evitar que la osteopenia se convierta en osteoporosis. Cules son las causas? La osteopenia es causada por la prdida de [employer].  Los huesos estn constantemente cambiando. Las clulas seas viejas se reemplazan continuamente por clulas seas nuevas. Este proceso genera hueso nuevo. El mineral calcio es necesario para fabricar hueso nuevo y Pharmacologist la densidad sea. La densidad sea generalmente se encuentra en su punto ms alta alrededor de los 35 aos. Despus de eso, el cuerpo de la la [employer] de las personas reemplaza todo hueso que han perdido con hueso nuevo. Qu incrementa el riesgo? Es ms probable que usted sufra esta afeccin si:  Es mayor de 13KGM.  Es Nurse, mental health que entr en la menopusica de [place] temprana.  Tiene una enfermedad prolongada que la mantiene en cama.  No realiza suficiente actividad fsica.  Le faltan ciertos nutrientes (malnutricin).  Tiene hiperactividad tiroidea (hipertiroidismo).  Fuma.  Bebe mucho alcohol.  Toma medicamentos que [employer], como los corticoesteroides. Cules son los signos o los sntomas? Esta afeccin no causa ningn sntoma. Puede tener un riesgo levemente ms alto de rotura de huesos (fracturas), de modo que sufrir fracturas ms fcilmente que lo normal puede ser un  indicio de osteopenia. Cmo se diagnostica? El mdico puede diagnosticar esta afeccin con un tipo especial de examen radiogrfico que mide la densidad sea (absorciometra de rayos X de energa dual, DEXA). Esta prueba puede medir la densidad sea en la cadera, la columna vertebral y las muecas. La osteopenia no presenta sntomas, de modo que esta afeccin se diagnostica generalmente despus de una prueba de deteccin de rutina de la densidad sea para la osteoporosis. Este estudio de deteccin de rutina se realiza para generalmente en:  Mujeres a partir de los 65 aos.  Hombres a [employer]. Si tiene factores de riesgo de sufrir osteopenia, puede someterse a la prueba de deteccin a una edad ms temprana. Cmo se trata? Hacer cambios en la dieta y el estilo de vida puede disminuir el riesgo de padecer osteoporosis. Si tiene osteopenia grave que est cerca de convertirse en osteoporosis, el mdico puede recetarle medicamentos y suplementos nutricionales tales como calcio y vitamina D. Estos suplementos ayudan a recuperar la densidad sea. Siga estas indicaciones en su casa:   Tome los medicamentos de venta libre y los recetados solamente como se lo haya indicado el mdico. Estos incluyen las vitaminas y los suplementos.  Consuma una dieta rica en calcio y vitamina D. ? El calcio se encuentra en los productos lcteos, los frijoles, el salmn y las verduras de hoja verde, como la espinaca y el brcoli. ? Busque alimentos que contengan vitamina D y calcio agregados (alimentos fortificados), como jugo de [place], cereales, y pan.  Haga 30 minutos o ms de una actividad fsica con soporte de [employer], Corporate treasurer, trotar o  practicar un deporte. Estos tipos de [employer].  Tome las precauciones necesarias en su casa para reducir el riesgo de cadas, como: ? Mantener las habitaciones bien iluminadas y libres de obstculos como cables. ? Instalar  barandas de seguridad en las escaleras. ? Usar tapetes de caucho en el bao y en otros sectores que a menudo estn mojados o son resbaladizos.  No consuma ningn producto que contenga nicotina o tabaco, como cigarrillos y Administrator, Civil Service. Si necesita ayuda para dejar de fumar, consulte al mdico.  Evite el alcohol o limite su consumo a no ms de por da si es mujer y no est [place], y por da si es hombre. Una medida equivale a 12oz ( ) de cerveza, 5oz ( ) de vino o 1oz (30ml) de bebidas alcohlicas de alta graduacin.  Concurra a todas las visitas de control como se lo haya indicado el mdico. Esto es importante. Comunquese con un mdico si:  No se ha realizado una prueba de deteccin de la densidad sea para la osteoporosis y usted: ? Es una mujer de 65 aos o ms. ? Es un hombre de 70 aos o ms.  Es una mujer posmenopusica que no se ha sometido a una prueba de deteccin de la densidad sea para la osteoporosis.  Tiene ms de 50 aos y desea saber si debe someterse a una prueba de deteccin de la densidad sea para la osteoporosis. Resumen  La osteopenia es la prdida de grosor (densidad) en el interior de [place]. Otra nombre para la osteopenia es una masa sea baja.  La osteopenia no es [place] enfermedad, pero puede aumentar el riesgo de Environmental education officer una afeccin que hace que los huesos se vuelvan delgados y se fracturen con ms facilidad (osteoporosis).  Puede estar en riesgo de padecer osteopenia si es mayor de 50 aos o si es una mujer que comenz la menopausia de [place] temprana.  La osteopenia no causa sntomas, pero puede diagnosticarse con una prueba de deteccin de la densidad sea.  Los [employer] dieta y el estilo de vida son Financial risk analyst tratamiento para osteopenia. Estos pueden reducir Nurse, adult de osteoporosis, Esta informacin no tiene Theme park manager el consejo del mdico. Asegrese de hacerle al mdico cualquier  pregunta que tenga. Document Revised: 10/10/2017 Document Reviewed: 06/23/2017 Elsevier Patient Education  2020 [employer].

## 2020-05-28 NOTE — Progress Notes (Signed)
Established Patient Office Visit  Subjective:  Patient ID: Emily Hamilton, female    DOB: [birth year]  Age: 74 y.o. MRN: [id number]   Due to a language barrier, video interpretation system was used at today's visit  CC: Annual well exam; chronic medical issue follow-up  HPI Emily Hamilton, 74 year old female who is scheduled for annual well exam.  Patient's last mammogram was done 04/13/2020 and normal.  Patient had bone density scan done 11/03/2019 which was consistent with osteopenia at the femoral neck and lumbar spine was not utilized due to degenerative changes.  She has had no postmenopausal bleeding.  She is fasting at today's visit for follow-up diabetes, essential hypertension and hyperlipidemia.  She reports that her home blood sugars continue to be well controlled but she has had recent increase in urinary frequency for the past week and has had onset of lower mid abdominal discomfort for the past 3 days.  She denies any headaches or dizziness related to her blood pressure.  She has had no increased muscle or joint aches associated with her use of cholesterol medication.  She does need 54-month supplies of her medications as she will be going to [place] a month in January.  She would also like to have her influenza immunization at today's visit.  Past Medical History:  Diagnosis Date  . Controlled type 2 diabetes mellitus (HCC)   . Family history of diabetes mellitus   . Hyperlipidemia   . Osteopenia   . Status post right knee replacement     Past Surgical History:  Procedure Laterality Date  . BREAST EXCISIONAL BIOPSY Left   . CHOLECYSTECTOMY    . Right knee replacement      Family History  Problem Relation Age of Onset  . Diabetes Mother   . Glaucoma Mother     Social History   Socioeconomic History  . Marital status: Married    Spouse name: Not on file  . Number of children: Not on file  . Years of education: Not on file  . Highest  education level: Not on file  Occupational History  . Not on file  Tobacco Use  . Smoking status: Never Smoker  . Smokeless tobacco: Never Used  Vaping Use  . Vaping Use: Never used  Substance and Sexual Activity  . Alcohol use: Never  . Drug use: Never  . Sexual activity: Never  Other Topics Concern  . Not on file  Social History Narrative  . Not on file   Social Determinants of Health   Financial Resource Strain:   . Difficulty of Paying Living Expenses: Not on file  Food Insecurity:   . Worried About Programme researcher, broadcasting/film/video in the Last Year: Not on file  . Ran Out of Food in the Last Year: Not on file  Transportation Needs:   . Lack of Transportation (Medical): Not on file  . Lack of Transportation (Non-Medical): Not on file  Physical Activity:   . Days of Exercise per Week: Not on file  . Minutes of Exercise per Session: Not on file  Stress:   . Feeling of Stress : Not on file  Social Connections:   . Frequency of Communication with Friends and Family: Not on file  . Frequency of Social Gatherings with Friends and Family: Not on file  . Attends Religious Services: Not on file  . Active Member of Clubs or Organizations: Not on file  . Attends Banker Meetings:  Not on file  . Marital Status: Not on file  Intimate Partner Violence:   . Fear of Current or Ex-Partner: Not on file  . Emotionally Abused: Not on file  . Physically Abused: Not on file  . Sexually Abused: Not on file    Outpatient Medications Prior to Visit  Medication Sig Dispense Refill  . apixaban (ELIQUIS) 5 MG TABS tablet Take 1 tablet (5 mg total) by mouth 2 (two) times daily. 60 tablet 6  . atorvastatin (LIPITOR) 40 MG tablet TOME UNA TABLETA TODOS LOS DIAS 90 tablet 1  . hydrochlorothiazide (MICROZIDE) 12.5 MG capsule TOME UNA CAPSULA TODOS LOS DIAS 90 capsule 0  . isosorbide mononitrate (IMDUR) 30 MG 24 hr tablet Take 0.5 tablets (15 mg total) by mouth daily. 45 tablet 3  . lisinopril  (ZESTRIL) 5 MG tablet Take 1 tablet (5 mg total) by mouth daily. 30 tablet 0  . metFORMIN (GLUCOPHAGE) 500 MG tablet TAKE 2 TABLETS (1,000 MG TOTAL) BY MOUTH 2 (TWO) TIMES DAILY WITH A MEAL. 120 tablet 0   No facility-administered medications prior to visit.    No Known Allergies  ROS Review of Systems  Constitutional: Negative for chills and fatigue.  Respiratory: Negative for cough and shortness of breath.   Cardiovascular: Negative for chest pain, palpitations and leg swelling.  Gastrointestinal: Positive for abdominal pain (mid-lower abdomen x 3 days). Negative for blood in stool, constipation, diarrhea and nausea.  Endocrine: Positive for polyuria (recent onset). Negative for polydipsia and polyphagia.  Genitourinary: Positive for frequency (for about a week). Negative for dysuria, pelvic pain, vaginal bleeding, vaginal discharge and vaginal pain.  Musculoskeletal: Positive for arthralgias. Negative for back pain.  Skin: Negative for rash and wound.  Neurological: Positive for numbness (occasional tingling in her feet at night). Negative for dizziness and headaches.  Hematological: Negative for adenopathy. Does not bruise/bleed easily.  Psychiatric/Behavioral: Negative for sleep disturbance. The patient is not nervous/anxious.       Objective:    Physical Exam Vitals and nursing note reviewed.  Constitutional:      General: She is not in acute distress.    Appearance: Normal appearance.  Neck:     Vascular: No carotid bruit.  Cardiovascular:     Rate and Rhythm: Normal rate and regular rhythm.     Pulses:          Dorsalis pedis pulses are 1+ on the right side and 1+ on the left side.       Posterior tibial pulses are 1+ on the right side and 1+ on the left side.  Pulmonary:     Effort: Pulmonary effort is normal.     Breath sounds: Normal breath sounds.  Chest:     Breasts:        Right: No swelling, bleeding, inverted nipple, mass, nipple discharge, skin change or  tenderness.        Left: No swelling, bleeding, inverted nipple, mass, nipple discharge, skin change or tenderness.  Abdominal:     Palpations: Abdomen is soft.     Tenderness: There is abdominal tenderness. There is no right CVA tenderness, left CVA tenderness, guarding or rebound.     Comments: Suprapubic discomfort to palpation  Musculoskeletal:        General: No tenderness.     Cervical back: Normal range of motion and neck supple. No rigidity or tenderness.     Right lower leg: No edema.     Left lower leg: No edema.  Right foot: Normal range of motion. Bunion present.     Left foot: Normal range of motion. Bunion present.  Feet:     Right foot:     Protective Sensation: 10 sites tested. 10 sites sensed.     Skin integrity: Dry skin present. No ulcer, blister or skin breakdown.     Toenail Condition: Right toenails are abnormally thick.     Left foot:     Protective Sensation: 10 sites tested. 10 sites sensed.     Skin integrity: Dry skin present. No ulcer, blister or skin breakdown.     Toenail Condition: Left toenails are abnormally thick.     Comments: Mild hammertoe deformities and thickened discolored bilateral great toenails Lymphadenopathy:     Cervical: No cervical adenopathy.     Upper Body:     Right upper body: No supraclavicular, axillary or pectoral adenopathy.     Left upper body: No supraclavicular, axillary or pectoral adenopathy.  Skin:    General: Skin is warm and dry.  Neurological:     General: No focal deficit present.     Mental Status: She is alert and oriented to person, place, and time.  Psychiatric:        Mood and Affect: Mood normal.        Behavior: Behavior normal.     BP 120/76 (BP Location: Left Arm, Patient Position: Sitting)   Pulse 81   Ht 4' 7.59" (1.412 m)   Wt 162 lb 9.6 oz (73.8 kg)   SpO2 99%   BMI 36.99 kg/m  Wt Readings from Last 3 Encounters:  10/24/19 165 lb (74.8 kg)  09/08/19 163 lb (73.9 kg)  09/05/19 163 lb  (73.9 kg)     Health Maintenance Due  Topic Date Due  . Hepatitis C Screening  Never done  . FOOT EXAM  01/20/2020  . HEMOGLOBIN A1C  02/02/2020     No results found for: TSH Lab Results  Component Value Date   WBC 7.3 08/05/2019   HGB 12.9 08/05/2019   HCT 39.1 08/05/2019   MCV 88 08/05/2019   PLT 191 08/05/2019   Lab Results  Component Value Date   NA 140 08/05/2019   K 4.1 08/05/2019   CO2 26 08/05/2019   GLUCOSE 98 08/05/2019   BUN 9 08/05/2019   CREATININE 0.67 08/05/2019   BILITOT 0.3 08/05/2019   ALKPHOS 94 08/05/2019   AST 23 08/05/2019   ALT 16 08/05/2019   PROT 7.3 08/05/2019   ALBUMIN 4.4 08/05/2019   CALCIUM 9.4 08/05/2019   Lab Results  Component Value Date   CHOL 155 08/05/2019   Lab Results  Component Value Date   HDL 53 08/05/2019   Lab Results  Component Value Date   LDLCALC 69 08/05/2019   Lab Results  Component Value Date   TRIG 200 (H) 08/05/2019   Lab Results  Component Value Date   CHOLHDL 2.9 08/05/2019   Lab Results  Component Value Date   HGBA1C 5.9 08/05/2019      Assessment & Plan:  1. Well female exam without gynecological exam Patient with well female exam.  Patient did not wish to have pelvic exam.  Patient's mammogram is currently up-to-date.  Patient has also had bone density exam which showed presence of osteopenia for which she will have vitamin D level done at today's visit.  Patient was given informational handout in Spanish regarding osteopenia.  Patient also received her influenza immunization at today's visit.  2. Type 2 diabetes mellitus without complication, without long-term current use of insulin (HCC) Patient will have hemoglobin A1c and comprehensive metabolic panel in follow-up of diabetes.  She had normal monofilament exam of the feet but complains of occasional tingling in the feet and prescription sent to patient's pharmacy for gabapentin 100 mg to take at bedtime.  Diabetic foot care was also  discussed.  She will also have lipid panel and urine microalbumin creatinine ratio at today's visit.  Urinalysis is patient with complaint of recent increase in urinary frequency. - Hemoglobin A1c - Comprehensive metabolic panel - Lipid panel - POCT URINALYSIS DIP (CLINITEK) - Urine Culture - Microalbumin/Creatinine Ratio, Urine - atorvastatin (LIPITOR) 40 MG tablet; TOME UNA TABLETA TODOS LOS DIAS  Dispense: 90 tablet; Refill: 1 - metFORMIN (GLUCOPHAGE) 500 MG tablet; Take 2 tablets (1,000 mg total) by mouth 2 (two) times daily with a meal.  Dispense: 120 tablet; Refill: 1  3. Hyperlipidemia LDL goal <70 Atorvastatin refilled at today's visit and patient will have LFTs as part of comprehensive metabolic panel in follow-up of long-term use of statin medication.  Fasting lipid panel will also be done at today's visit. - Comprehensive metabolic panel - Lipid panel  4. Osteopenia, unspecified location On DEXA scan, patient had osteopenia of the femoral neck.  She will have vitamin D level done at today's visit.  Educational information in Spanish provided regarding osteopenia. - Vitamin D, 25-hydroxy  5. Encounter for long-term current use of medication Patient will have comprehensive metabolic panel and follow-up of long-term use of medications for the treatment of diabetes, hypertension and hyperlipidemia - Comprehensive metabolic panel  6. Encounter for long-term use of antiplatelets/antithrombotics Patient is on daily aspirin and is also supposed to be on blood thinning medication, Eliquis but reported on review of medications with the CMA that she was not currently taking the Eliquis.  Note was sent to patient's cardiologist to contact the patient if she is supposed to continue the use of Eliquis.  Per her most recent cardiology note in the chart, patient was to continue this medication.  Patient will have CBC in follow-up of long-term use of aspirin. - CBC  7. Suprapubic  tenderness Patient with suprapubic tenderness and complaint of urinary frequency and urinalysis done at today's visit.  Urine will also be sent for culture.  Prescription provided for Bactrim DS twice daily x3 days for uncomplicated urinary tract infection while awaiting urine culture results. - POCT URINALYSIS DIP (CLINITEK) - Urine Culture - sulfamethoxazole-trimethoprim (BACTRIM DS) 800-160 MG tablet; Take 1 tablet by mouth 2 (two) times daily.  Dispense: 6 tablet; Refill: 0  8. Essential hypertension Patient's blood pressure is controlled on her current medications including hydrochlorothiazide, lisinopril and isosorbide mononitrate/Imdur for chest pain. - hydrochlorothiazide (MICROZIDE) 12.5 MG capsule; TOME UNA CAPSULA TODOS LOS DIAS  Dispense: 90 capsule; Refill: 3 - isosorbide mononitrate (IMDUR) 30 MG 24 hr tablet; Take 0.5 tablets (15 mg total) by mouth daily.  Dispense: 45 tablet; Refill: 3 - lisinopril (ZESTRIL) 5 MG tablet; Take 1 tablet (5 mg total) by mouth daily.  Dispense: 90 tablet; Refill: 1  9. Need for immunization against influenza Patient was offered and agreed to have influenza immunization at today's visit.  Educational material regarding influenza immunization was provided to the patient. - Flu Vaccine QUAD 36+ mos IM  10. Paresthesia of both feet Patient with normal monofilament exam at today's visit but patient with complaint of numbness/tingling sensation in her feet which occurs at night.  Prescription sent to patient's pharmacy for gabapentin 100 mg to take at night to help with this discomfort in her feet. - gabapentin (NEURONTIN) 100 MG capsule; Take 1 capsule (100 mg total) by mouth at bedtime. For tingling in feet  Dispense: 90 capsule; Refill: 3     Follow-up: Return in about 6 months (around 11/25/2020) for chronic issues- sooner if needed.   Cain Saupe, MD

## 2020-05-28 NOTE — Progress Notes (Signed)
EXPERIENCES LEFT ARM PAIN THAT COMES AND GOES

## 2020-05-29 LAB — LIPID PANEL
Chol/HDL Ratio: 3.8 ratio (ref 0.0–4.4)
Cholesterol, Total: 218 mg/dL — ABNORMAL HIGH (ref 100–199)
HDL: 57 mg/dL
LDL Chol Calc (NIH): 123 mg/dL — ABNORMAL HIGH (ref 0–99)
Triglycerides: 215 mg/dL — ABNORMAL HIGH (ref 0–149)
VLDL Cholesterol Cal: 38 mg/dL (ref 5–40)

## 2020-05-29 LAB — CBC
Hematocrit: 41.6 % (ref 34.0–46.6)
Hemoglobin: 13.4 g/dL (ref 11.1–15.9)
MCH: 28.6 pg (ref 26.6–33.0)
MCHC: 32.2 g/dL (ref 31.5–35.7)
MCV: 89 fL (ref 79–97)
Platelets: 272 x10E3/uL (ref 150–450)
RBC: 4.69 x10E6/uL (ref 3.77–5.28)
RDW: 13.3 % (ref 11.7–15.4)
WBC: 6.7 x10E3/uL (ref 3.4–10.8)

## 2020-05-29 LAB — COMPREHENSIVE METABOLIC PANEL WITH GFR
ALT: 27 IU/L (ref 0–32)
AST: 34 IU/L (ref 0–40)
Albumin/Globulin Ratio: 1.4 (ref 1.2–2.2)
Albumin: 4.6 g/dL (ref 3.7–4.7)
Alkaline Phosphatase: 90 IU/L (ref 44–121)
BUN/Creatinine Ratio: 22 (ref 12–28)
BUN: 15 mg/dL (ref 8–27)
Bilirubin Total: 0.3 mg/dL (ref 0.0–1.2)
CO2: 24 mmol/L (ref 20–29)
Calcium: 9.8 mg/dL (ref 8.7–10.3)
Chloride: 100 mmol/L (ref 96–106)
Creatinine, Ser: 0.68 mg/dL (ref 0.57–1.00)
GFR calc Af Amer: 101 mL/min/1.73
GFR calc non Af Amer: 88 mL/min/1.73
Globulin, Total: 3.4 g/dL (ref 1.5–4.5)
Glucose: 100 mg/dL — ABNORMAL HIGH (ref 65–99)
Potassium: 4.2 mmol/L (ref 3.5–5.2)
Sodium: 138 mmol/L (ref 134–144)
Total Protein: 8 g/dL (ref 6.0–8.5)

## 2020-05-29 LAB — MICROALBUMIN / CREATININE URINE RATIO
Creatinine, Urine: 83.6 mg/dL
Microalb/Creat Ratio: 14 mg/g{creat} (ref 0–29)
Microalbumin, Urine: 11.7 ug/mL

## 2020-05-29 LAB — HEMOGLOBIN A1C
Est. average glucose Bld gHb Est-mCnc: 134 mg/dL
Hgb A1c MFr Bld: 6.3 % — ABNORMAL HIGH (ref 4.8–5.6)

## 2020-05-29 LAB — VITAMIN D 25 HYDROXY (VIT D DEFICIENCY, FRACTURES): Vit D, 25-Hydroxy: 28.1 ng/mL — ABNORMAL LOW (ref 30.0–100.0)

## 2020-05-30 ENCOUNTER — Telehealth: Payer: Self-pay | Admitting: Internal Medicine

## 2020-05-30 LAB — URINE CULTURE

## 2020-05-30 NOTE — Telephone Encounter (Signed)
Patient was on Eliquis from Feb to Nov with no ADR reported until Nov. Doubtful her GI issues were caused by Eliquis.    Okay to re- chalnege with Eliquis 5mg ; otherwise, patient can try Xarelto 20mg  daily (we ar not able to supply 2 month sample of xarelto, but can provide 2 weeks if needed).

## 2020-05-30 NOTE — Telephone Encounter (Signed)
Pt c/o medication issue:  1. Name of Medication: apixaban (ELIQUIS) 5 MG TABS tablet  2. How are you currently taking this medication (dosage and times per day)? As directed   3. Are you having a reaction (difficulty breathing--STAT)? Diarrhea   4. What is your medication issue? PT wanted to know if there is another medication she could be on that would not cause this side effect.   Pt would also need samples of the new medication. She will be on vacation for two months.  Call was handled using an interpreter

## 2020-05-30 NOTE — Telephone Encounter (Signed)
Returned the call to the patient using the interpretor [phone].   The patient stated that she discontinued the Eliquis 15 days ago due to chronic diarrhea. She stated that the diarrhea stopped when she stopped the Eliquis. She has been taking 81 mg aspirin in its place. She wanted to know if she could try something else besides the Eliquis.   She is leaving for [place] on 06[place]09/20.

## 2020-05-31 NOTE — Telephone Encounter (Signed)
Left a message for the patient to call back.  

## 2020-06-01 NOTE — Telephone Encounter (Signed)
Attempted to call patient using interpreter number [phone]. Interpreter unable to reach patient. Advised interpreter to leave message for patient to call back to office in regards to her blood thinner medication. Per Interpreter message was left on patients voicemail to return call to office when able.

## 2020-06-02 ENCOUNTER — Encounter (HOSPITAL_COMMUNITY): Payer: Self-pay | Admitting: *Deleted

## 2020-06-02 ENCOUNTER — Emergency Department (HOSPITAL_COMMUNITY): Payer: Medicare HMO

## 2020-06-02 ENCOUNTER — Other Ambulatory Visit: Payer: Self-pay

## 2020-06-02 ENCOUNTER — Emergency Department (HOSPITAL_COMMUNITY)
Admission: EM | Admit: 2020-06-02 | Discharge: 2020-06-02 | Disposition: A | Discharge status: Home / Self Care | Payer: Medicare HMO | Attending: Emergency Medicine | Admitting: Emergency Medicine

## 2020-06-02 DIAGNOSIS — M25562 Pain in left knee: Secondary | ICD-10-CM

## 2020-06-02 DIAGNOSIS — Z794 Long term (current) use of insulin: Secondary | ICD-10-CM | POA: Diagnosis not present

## 2020-06-02 DIAGNOSIS — Z96651 Presence of right artificial knee joint: Secondary | ICD-10-CM | POA: Diagnosis not present

## 2020-06-02 DIAGNOSIS — M1712 Unilateral primary osteoarthritis, left knee: Secondary | ICD-10-CM | POA: Diagnosis not present

## 2020-06-02 DIAGNOSIS — Z79899 Other long term (current) drug therapy: Secondary | ICD-10-CM | POA: Diagnosis not present

## 2020-06-02 DIAGNOSIS — I10 Essential (primary) hypertension: Secondary | ICD-10-CM | POA: Insufficient documentation

## 2020-06-02 DIAGNOSIS — E119 Type 2 diabetes mellitus without complications: Secondary | ICD-10-CM | POA: Insufficient documentation

## 2020-06-02 HISTORY — DX: Essential (primary) hypertension: I10

## 2020-06-02 MED ORDER — KETOROLAC TROMETHAMINE 15 MG/ML IJ SOLN
15.0000 mg | Freq: Once | INTRAMUSCULAR | Status: AC
  Administered 2020-06-02: 15 mg via INTRAMUSCULAR
  Filled 2020-06-02: qty 1

## 2020-06-02 NOTE — ED Provider Notes (Signed)
[facility] EMERGENCY DEPARTMENT Provider Note   CSN: [id number] Arrival date & time: 06/02/20  1102     History No chief complaint on file.   Emily Hamilton is a 74 y.o. female.  The history is provided by the patient. The history is limited by a language barrier. A language interpreter was used.  Knee Pain Location:  Knee Knee location:  L knee Pain details:    Quality:  Aching   Radiates to:  Does not radiate   Severity:  Moderate   Onset quality:  Gradual   Duration:  1 day   Timing:  Intermittent   Progression:  Waxing and waning Chronicity:  New Prior injury to area:  No Relieved by:  Nothing Worsened by:  Bearing weight Ineffective treatments:  None tried Associated symptoms: no back pain, no fatigue, no fever, no muscle weakness, no numbness, no swelling and no tingling        Past Medical History:  Diagnosis Date  . Controlled type 2 diabetes mellitus (HCC)   . Family history of diabetes mellitus   . Hyperlipidemia   . Hypertension   . Osteopenia   . Status post right knee replacement     Patient Active Problem List   Diagnosis Date Noted  . Hyperlipidemia LDL goal <70 10/18/2018  . Osteopenia 10/18/2018  . Type 2 diabetes mellitus without complication, without long-term current use of insulin (HCC) 09/08/2018    Past Surgical History:  Procedure Laterality Date  . BREAST EXCISIONAL BIOPSY Left   . CHOLECYSTECTOMY    . Right knee replacement       OB History   No obstetric history on file.     Family History  Problem Relation Age of Onset  . Diabetes Mother   . Glaucoma Mother     Social History   Tobacco Use  . Smoking status: Never Smoker  . Smokeless tobacco: Never Used  Vaping Use  . Vaping Use: Never used  Substance Use Topics  . Alcohol use: Never  . Drug use: Never    Home Medications Prior to Admission medications   Medication Sig Start Date End Date Taking? Authorizing Provider  apixaban  (ELIQUIS) 5 MG TABS tablet Take 1 tablet (5 mg total) by mouth 2 (two) times daily. Patient not taking: Reported on 05/28/2020 04/03/20   Fulp, Hewitt Shorts, MD  atorvastatin (LIPITOR) 40 MG tablet TOME UNA TABLETA TODOS LOS DIAS 05/28/20   Fulp, Cammie, MD  gabapentin (NEURONTIN) 100 MG capsule Take 1 capsule (100 mg total) by mouth at bedtime. For tingling in feet 05/28/20   Fulp, Cammie, MD  hydrochlorothiazide (MICROZIDE) 12.5 MG capsule TOME UNA CAPSULA TODOS LOS DIAS 05/28/20   Fulp, Cammie, MD  isosorbide mononitrate (IMDUR) 30 MG 24 hr tablet Take 0.5 tablets (15 mg total) by mouth daily. 05/28/20   Fulp, Cammie, MD  lisinopril (ZESTRIL) 5 MG tablet Take 1 tablet (5 mg total) by mouth daily. 05/28/20   Fulp, Cammie, MD  metFORMIN (GLUCOPHAGE) 500 MG tablet Take 2 tablets (1,000 mg total) by mouth 2 (two) times daily with a meal. 05/28/20   Fulp, Cammie, MD  sulfamethoxazole-trimethoprim (BACTRIM DS) 800-160 MG tablet Take 1 tablet by mouth 2 (two) times daily. 05/28/20   Cain Saupe, MD    Allergies    Patient has no known allergies.  Review of Systems   Review of Systems  Constitutional: Negative for chills, fatigue and fever.  HENT: Negative for congestion and rhinorrhea.  Respiratory: Negative for cough and shortness of breath.   Cardiovascular: Negative for chest pain and palpitations.  Gastrointestinal: Negative for diarrhea, nausea and vomiting.  Genitourinary: Negative for difficulty urinating and dysuria.  Musculoskeletal: Positive for arthralgias. Negative for back pain.  Skin: Negative for rash and wound.  Neurological: Negative for light-headedness and headaches.    Physical Exam Updated Vital Signs BP (!) 153/85 (BP Location: Right Arm)   Pulse 82   Temp 98.3 F (36.8 C) (Oral)   Resp 16   Ht 4\' 8"  (1.422 m)   Wt 74.8 kg   SpO2 99%   BMI 36.99 kg/m   Physical Exam Vitals and nursing note reviewed. Exam conducted with a chaperone present.  Constitutional:       General: She is not in acute distress.    Appearance: Normal appearance.  HENT:     Head: Normocephalic and atraumatic.     Nose: No rhinorrhea.  Eyes:     General:        Right eye: No discharge.        Left eye: No discharge.     Conjunctiva/sclera: Conjunctivae normal.  Cardiovascular:     Rate and Rhythm: Normal rate and regular rhythm.  Pulmonary:     Effort: Pulmonary effort is normal. No respiratory distress.     Breath sounds: No stridor.  Abdominal:     General: Abdomen is flat. There is no distension.     Palpations: Abdomen is soft.  Musculoskeletal:        General: Tenderness present. No swelling, deformity or signs of injury.     Right lower leg: No edema.     Left lower leg: No edema.     Comments: Tenderness throughout the entire knee joint but full active and passive range of motion, no erythema, no warmth, neurovascularly intact, when ranging the knee I feel significant crepitus in the joint  Skin:    General: Skin is warm and dry.  Neurological:     General: No focal deficit present.     Mental Status: She is alert. Mental status is at baseline.     Motor: No weakness.  Psychiatric:        Mood and Affect: Mood normal.        Behavior: Behavior normal.     ED Results / Procedures / Treatments   Labs (all labs ordered are listed, but only abnormal results are displayed) Labs Reviewed - No data to display  EKG None  Radiology DG Knee 2 Views Left  Result Date: 06/02/2020 CLINICAL DATA:  Left knee pain since yesterday.  No known injury. EXAM: LEFT KNEE - 1-2 VIEW COMPARISON:  None. FINDINGS: No fractures or effusions identified. Degenerative changes most prominent the lateral compartment with bony sclerosis and loss of joint space. IMPRESSION: Degenerative changes most prominent the lateral compartment with loss of joint space and bony sclerosis. Electronically Signed   By: 06/04/2020 III M.D   On: 06/02/2020 12:46    Procedures Procedures  (including critical care time)  Medications Ordered in ED Medications  ketorolac (TORADOL) 15 MG/ML injection 15 mg (15 mg Intramuscular Given 06/02/20 1407)    ED Course  I have reviewed the triage vital signs and the nursing notes.  Pertinent labs & imaging results that were available during my care of the patient were reviewed by me and considered in my medical decision making (see chart for details).    MDM Rules/Calculators/A&P  Likely acute on chronic worsening arthritic changes of the knee.  No injury reported.  No significant laxity in the joint, x-rays reviewed by radiology myself show likely chronic arthritic changes.  Patient is able to bear weight but has discomfort.  Anti-inflammatories recommended orthopedic follow-up recommended as well.  Patient agrees to this plan strict return precautions are given.  No sign of deep space vascular abnormality or infectious process at this time.  Pt is safe for DC home with outpatient follow up. The patient agrees with the plan and has no other questions or concerns.  Final Clinical Impression(s) / ED Diagnoses Final diagnoses:  Acute pain of left knee    Rx / DC Orders ED Discharge Orders    None       Sabino Donovan, MD 06/03/20 [phone]

## 2020-06-02 NOTE — Discharge Instructions (Addendum)
You can take 600 mg of ibuprofen every 6 hours, you can take 1000 mg of Tylenol every 6 hours, you can alternate these every 3 or you can take them together.  

## 2020-06-02 NOTE — ED Triage Notes (Signed)
Used [employer] patient c/o left knee pain states she was walking yest and her knee started hurting. Denies inj.

## 2020-06-05 MED ORDER — RIVAROXABAN 20 MG PO TABS: 20.0000 mg | ORAL_TABLET | Freq: Every day | ORAL | 3 refills | Status: DC

## 2020-06-05 MED ORDER — RIVAROXABAN 20 MG PO TABS: 20.0000 mg | ORAL_TABLET | Freq: Every day | ORAL | 1 refills | Status: DC

## 2020-06-05 NOTE — Telephone Encounter (Signed)
Spoke with patient using Interpreter [phone]. Patient verified stating name and DOB. Advised patient that per pharmacy she can either re challenge with Eliquis or switch to Xarelto 20mg  (1) tablet daily. Patient stated via interpreter that she would like to switch to Xarelto, she feels that Eliquis causes her diarrhea. Advised patient via interpreter that she will need to stop taking Aspirin and can start the Xarelto 20mg  (1) tablet ONCE daily. Advised patient that we are not able to provide 2 months of samples but that we can provide 2 weeks worth. Advised patient that the Xarelto 20mg  (1) tablet once daily has been ordered and sent to pharmacy as 90 tablets (3) month supply. Advised patient to pick this up prior to leaving for vacation. Advised patient to call back if there are any issues or concerns.   Patient per interpreter verbalized understanding of all instructions.   Samples of Xarelto 20mg  were placed at the front desk for patient to pick up, quantity 2 bottles (2) weeks worth, Lot Number [id number] 08/23

## 2020-06-27 ENCOUNTER — Ambulatory Visit: Payer: Medicare HMO | Admitting: Internal Medicine

## 2020-08-31 ENCOUNTER — Telehealth: Payer: Self-pay | Admitting: Internal Medicine

## 2020-08-31 NOTE — Telephone Encounter (Signed)
Kennith Center, from [facility], calling stating that she spoke with pt this morning. She states that the pt states that she has diabetes with no glucometer and high cholesterol with no cholesterol medication. She also states that the pt has not been since last year 05/2020. Pt needs to have an interpreter. Please advise.       [phone] ext [phone]

## 2020-08-31 NOTE — Telephone Encounter (Signed)
Patient has been schedule for march 29, at 10:10 with [facility].

## 2020-10-09 ENCOUNTER — Ambulatory Visit: Payer: Medicare HMO | Attending: Internal Medicine | Admitting: Internal Medicine

## 2020-10-09 ENCOUNTER — Other Ambulatory Visit: Payer: Self-pay

## 2020-10-09 DIAGNOSIS — D6869 Other thrombophilia: Secondary | ICD-10-CM | POA: Diagnosis not present

## 2020-10-09 DIAGNOSIS — Z23 Encounter for immunization: Secondary | ICD-10-CM | POA: Diagnosis not present

## 2020-10-09 DIAGNOSIS — E1169 Type 2 diabetes mellitus with other specified complication: Secondary | ICD-10-CM | POA: Diagnosis not present

## 2020-10-09 DIAGNOSIS — E785 Hyperlipidemia, unspecified: Secondary | ICD-10-CM | POA: Diagnosis not present

## 2020-10-09 DIAGNOSIS — I4811 Longstanding persistent atrial fibrillation: Secondary | ICD-10-CM | POA: Diagnosis not present

## 2020-10-09 DIAGNOSIS — I10 Essential (primary) hypertension: Secondary | ICD-10-CM

## 2020-10-09 DIAGNOSIS — R140 Abdominal distension (gaseous): Secondary | ICD-10-CM | POA: Diagnosis not present

## 2020-10-09 DIAGNOSIS — E1159 Type 2 diabetes mellitus with other circulatory complications: Secondary | ICD-10-CM | POA: Diagnosis not present

## 2020-10-09 DIAGNOSIS — R438 Other disturbances of smell and taste: Secondary | ICD-10-CM | POA: Diagnosis not present

## 2020-10-09 MED ORDER — FAMOTIDINE 20 MG PO TABS: 20.0000 mg | ORAL_TABLET | Freq: Every day | ORAL | 3 refills | Status: DC

## 2020-10-09 NOTE — Progress Notes (Addendum)
Virtual Visit via Telephone Note  I connected with Emily Hamilton on 10/09/20 at  10:55 a.m by telephone and verified that I am speaking with the correct person using two identifiers.  Location: Patient: home Provider: office  Participants: Myself Patient CMA: Ms. Reginia Forts interpreter: #[id number], Bonnielee Haff   I discussed the limitations, risks, security and privacy concerns of performing an evaluation and management service by telephone and the availability of in person appointments. I also discussed with the patient that there may be a patient responsible charge related to this service. The patient expressed understanding and agreed to proceed.   History of Present Illness: Patient with history of DM type II, HL, HTN, osteopenia, permanent atrial fibrillation, Vit D def Previous PCP is Dr. Jillyn Hidden who is no longer with the practice.  DIABETES TYPE 2 Last A1C:   Lab Results  Component Value Date   HGBA1C 6.3 (H) 05/28/2020  Med Adherence:  [x]  Yes  - taking Metformin 1 gram BID Medication side effects:  []  Yes    []  No Home Monitoring?  []  Yes    [x]  No Home glucose results range:  Diet Adherence: [x]  Yes.  eating more veggies and less bread and tortias   Exercise: [x]  Yes -walks 20 mins daily Hypoglycemic episodes?: []  Yes    []  No Numbness of the feet? []  Yes    [x]  No Retinopathy hx? []  Yes    []  No Last eye exam:  Comments:   Permanent A.fib:  Reports compliance with Xarelto.  No bruising or bleeding.  No palpitations.  Last saw cardiologist Dr. 12/2019  HYPERTENSION Currently taking: see medication list.  Reports compliance with HCTZ 12.5 mg, Imdur 15 mg and Lisinopril 5 mg daily Med Adherence: [x]  Yes    []  No Medication side effects: []  Yes    [x]  No Adherence with salt restriction: []  Yes    []  No Home Monitoring?: []  Yes    [x]  No Monitoring Frequency: []  Yes    []  No Home BP results range: []  Yes    []  No SOB? []  Yes    [x]  No Chest Pain?: []   Yes    [x]  No Leg swelling?: []  Yes    [x]  No Headaches?: []  Yes    [x]  No Dizziness? []  Yes    [x]  No Comments:   HL:  Taking and tolerating Lipitor 40 mg daily.  No muscle aches/pains  C/o inflammation and bloating in stomach x 3 mths  Worse with foods like brocolli, rice and beans.  Does not eat spicy foods No burning in stomach but at nights she gets bitter taste in mouth.    Outpatient Encounter Medications as of 10/09/2020  Medication Sig  . atorvastatin (LIPITOR) 40 MG tablet TOME UNA TABLETA TODOS LOS DIAS (Patient not taking: Reported on 10/09/2020)  . gabapentin (NEURONTIN) 100 MG capsule Take 1 capsule (100 mg total) by mouth at bedtime. For tingling in feet (Patient not taking: Reported on 10/09/2020)  . hydrochlorothiazide (MICROZIDE) 12.5 MG capsule TOME UNA CAPSULA TODOS LOS DIAS (Patient not taking: Reported on 10/09/2020)  . isosorbide mononitrate (IMDUR) 30 MG 24 hr tablet Take 0.5 tablets (15 mg total) by mouth daily. (Patient not taking: Reported on 10/09/2020)  . lisinopril (ZESTRIL) 5 MG tablet Take 1 tablet (5 mg total) by mouth daily. (Patient not taking: Reported on 10/09/2020)  . metFORMIN (GLUCOPHAGE) 500 MG tablet Take 2 tablets (1,000 mg total) by mouth 2 (two) times daily with a meal. (  Patient not taking: Reported on 10/09/2020)  . rivaroxaban (XARELTO) 20 MG TABS tablet Take 1 tablet (20 mg total) by mouth daily with supper. (Patient not taking: Reported on 10/09/2020)  . sulfamethoxazole-trimethoprim (BACTRIM DS) 800-160 MG tablet Take 1 tablet by mouth 2 (two) times daily. (Patient not taking: Reported on 10/09/2020)   No facility-administered encounter medications on file as of 10/09/2020.      Observations/Objective: Results for orders placed or performed in visit on 05/28/20  Urine Culture   Specimen: Blood   UR  Result Value Ref Range   Urine Culture, Routine Final report    Organism ID, Bacteria Comment   Hemoglobin A1c  Result Value Ref Range   Hgb  A1c MFr Bld 6.3 (H) 4.8 - 5.6 %   Est. average glucose Bld gHb Est-mCnc 134 mg/dL  Comprehensive metabolic panel  Result Value Ref Range   Glucose 100 (H) 65 - 99 mg/dL   BUN 15 8 - 27 mg/dL   Creatinine, Ser 8.46 0.57 - 1.00 mg/dL   GFR calc non Af Amer 88 >59 mL/min/1.73   GFR calc Af Amer 101 >59 mL/min/1.73   BUN/Creatinine Ratio 22 12 - 28   Sodium 138 134 - 144 mmol/L   Potassium 4.2 3.5 - 5.2 mmol/L   Chloride 100 96 - 106 mmol/L   CO2 24 20 - 29 mmol/L   Calcium 9.8 8.7 - 10.3 mg/dL   Total Protein 8.0 6.0 - 8.5 g/dL   Albumin 4.6 3.7 - 4.7 g/dL   Globulin, Total 3.4 1.5 - 4.5 g/dL   Albumin/Globulin Ratio 1.4 1.2 - 2.2   Bilirubin Total 0.3 0.0 - 1.2 mg/dL   Alkaline Phosphatase 90 44 - 121 IU/L   AST 34 0 - 40 IU/L   ALT 27 0 - 32 IU/L  Lipid panel  Result Value Ref Range   Cholesterol, Total 218 (H) 100 - 199 mg/dL   Triglycerides 962 (H) 0 - 149 mg/dL   HDL 57 >95 mg/dL   VLDL Cholesterol Cal 38 5 - 40 mg/dL   LDL Chol Calc (NIH) 284 (H) 0 - 99 mg/dL   Chol/HDL Ratio 3.8 0.0 - 4.4 ratio  Vitamin D, 25-hydroxy  Result Value Ref Range   Vit D, 25-Hydroxy 28.1 (L) 30.0 - 100.0 ng/mL  Microalbumin/Creatinine Ratio, Urine  Result Value Ref Range   Creatinine, Urine 83.6 Not Estab. mg/dL   Microalbumin, Urine 13.2 Not Estab. ug/mL   Microalb/Creat Ratio 14 0 - 29 mg/g creat  CBC  Result Value Ref Range   WBC 6.7 3.4 - 10.8 x10E3/uL   RBC 4.69 3.77 - 5.28 x10E6/uL   Hemoglobin 13.4 11.1 - 15.9 g/dL   Hematocrit 44.0 10.2 - 46.6 %   MCV 89 79 - 97 fL   MCH 28.6 26.6 - 33.0 pg   MCHC 32.2 31.5 - 35.7 g/dL   RDW 72.5 36.6 - 44.0 %   Platelets 272 150 - 450 x10E3/uL  POCT URINALYSIS DIP (CLINITEK)  Result Value Ref Range   Color, UA light yellow (A) yellow   Clarity, UA cloudy (A) clear   Glucose, UA negative negative mg/dL   Bilirubin, UA negative negative   Ketones, POC UA negative negative mg/dL   Spec Grav, UA >=3.474 (A) 1.010 - 1.025   Blood, UA  negative negative   pH, UA 6.0 5.0 - 8.0   POC PROTEIN,UA negative negative, trace   Urobilinogen, UA 0.2 0.2 or 1.0 E.U./dL   Nitrite,  UA Negative Negative   Leukocytes, UA Trace (A) Negative     Assessment and Plan: 1. Type 2 diabetes mellitus with other circulatory complications [facility]) Patient not checking blood sugars but compliant with taking Metformin and doing okay with eating habits.  Encouraged her to continue healthy eating habits.  Counseling given.  Continue regular exercise.  She will come to the lab to have A1c drawn later this week. - Hemoglobin A1c; Future  2. Essential hypertension Continue current medications and low-salt diet.  We will have her schedule with clinical pharmacist for blood pressure check in 1 to 2 weeks.  3. Hyperlipidemia associated with type 2 diabetes mellitus (HCC) Continue atorvastatin  4. Longstanding persistent atrial fibrillation (HCC) Acquired thrombophilia -Patient with history of atrial fibrillation on anticoagulation.  No bruising or bleeding on the medicine.  Check CBC when she comes to the lab  5. Abdominal bloating Advised to eat smaller portions of foods that may cause bloating green leafy vegetables and beans.  She does have some symptoms that may suggest acid reflux like the bitter taste at the back of the mouth so GERD precautions also advised.  Informed to avoid spicy foods, juices, tomato-based foods, eat last meal at least 2 hours before going to bed at night and to sleep with her head a little elevated.  I will put her on Pepcid at bedtime. - H. pylori breath test; Future  6. Bitter taste See #5 above  7. Need for vaccination against Streptococcus pneumoniae using pneumococcal conjugate vaccine 13 Patient agreeable to receiving Pneumovax on visit with pharmacist.  8. Need for Tdap vaccination Agreeable to receiving Tdap on visit with pharmacist.  Follow Up Instructions: 2 months. Follow-up in 1 to 2 weeks with clinical  pharmacist for blood pressure check and for vaccines.   I discussed the assessment and treatment plan with the patient. The patient was provided an opportunity to ask questions and all were answered. The patient agreed with the plan and demonstrated an understanding of the instructions.   The patient was advised to call back or seek an in-person evaluation if the symptoms worsen or if the condition fails to improve as anticipated.  I  Spent 31 minutes on this telephone encounter  Jonah Blue, MD

## 2020-10-09 NOTE — Progress Notes (Signed)
Pt states for the past 3 months she has been having inflammation in her stomach   Pt states she has a bitter taste in her mouth

## 2020-10-11 ENCOUNTER — Ambulatory Visit: Payer: Medicare HMO | Attending: Internal Medicine

## 2020-10-11 ENCOUNTER — Other Ambulatory Visit: Payer: Self-pay

## 2020-10-11 DIAGNOSIS — R140 Abdominal distension (gaseous): Secondary | ICD-10-CM | POA: Diagnosis not present

## 2020-10-11 DIAGNOSIS — E1159 Type 2 diabetes mellitus with other circulatory complications: Secondary | ICD-10-CM

## 2020-10-11 DIAGNOSIS — D6869 Other thrombophilia: Secondary | ICD-10-CM

## 2020-10-12 LAB — CBC
Hematocrit: 41.5 % (ref 34.0–46.6)
Hemoglobin: 13.6 g/dL (ref 11.1–15.9)
MCH: 28.5 pg (ref 26.6–33.0)
MCHC: 32.8 g/dL (ref 31.5–35.7)
MCV: 87 fL (ref 79–97)
Platelets: 245 10*3/uL (ref 150–450)
RBC: 4.78 x10E6/uL (ref 3.77–5.28)
RDW: 14.6 % (ref 11.7–15.4)
WBC: 7.2 10*3/uL (ref 3.4–10.8)

## 2020-10-12 LAB — HEMOGLOBIN A1C
Est. average glucose Bld gHb Est-mCnc: 140 mg/dL
Hgb A1c MFr Bld: 6.5 % — ABNORMAL HIGH (ref 4.8–5.6)

## 2020-10-13 ENCOUNTER — Other Ambulatory Visit: Payer: Self-pay | Admitting: Internal Medicine

## 2020-10-13 ENCOUNTER — Encounter: Payer: Self-pay | Admitting: Internal Medicine

## 2020-10-13 DIAGNOSIS — I4891 Unspecified atrial fibrillation: Secondary | ICD-10-CM | POA: Insufficient documentation

## 2020-10-13 LAB — H. PYLORI BREATH TEST: H pylori Breath Test: POSITIVE — AB

## 2020-10-13 MED ORDER — OMEPRAZOLE 20 MG PO CPDR: 20.0000 mg | DELAYED_RELEASE_CAPSULE | Freq: Two times a day (BID) | ORAL | 0 refills | Status: DC

## 2020-10-13 MED ORDER — CLARITHROMYCIN 500 MG PO TABS: 500.0000 mg | ORAL_TABLET | Freq: Two times a day (BID) | ORAL | 0 refills | Status: DC

## 2020-10-13 MED ORDER — AMOXICILLIN 500 MG PO TABS: 1000.0000 mg | ORAL_TABLET | Freq: Two times a day (BID) | ORAL | 0 refills | Status: DC

## 2020-10-13 NOTE — Progress Notes (Signed)
Let patient know that she tested positive for the stomach bacteria that may be causing some of her gastrointestinal symptoms.  This is treated with 2 antibiotics called clarithromycin and amoxicillin and one other medication to help decrease stomach acid production called omeprazole.. She should pick up all 3 medications and start taking them together.  Each medication is to be taken twice a day that is in the mornings and in the evenings for 2 weeks.  One of the antibiotics interacts adversely with her cholesterol medicine called Atorvastatin.  So please hold off on taking ATORVASTATIN for the two weeks that she is taking the treatment for thstomach bacteria.

## 2020-10-17 ENCOUNTER — Telehealth: Payer: Self-pay

## 2020-10-17 NOTE — Telephone Encounter (Signed)
[employer] interpre[employer] Emily Hamilton  Id# [id number]  contacted pt to go over lab results pt is aware and doesn't have any questions or concerns

## 2020-10-25 ENCOUNTER — Ambulatory Visit: Payer: Medicare HMO | Attending: Internal Medicine | Admitting: Pharmacist

## 2020-10-25 ENCOUNTER — Other Ambulatory Visit: Payer: Self-pay

## 2020-10-25 ENCOUNTER — Encounter: Payer: Self-pay | Admitting: Pharmacist

## 2020-10-25 VITALS — BP 134/79

## 2020-10-25 DIAGNOSIS — Z23 Encounter for immunization: Secondary | ICD-10-CM

## 2020-10-25 DIAGNOSIS — I10 Essential (primary) hypertension: Secondary | ICD-10-CM | POA: Diagnosis not present

## 2020-10-25 NOTE — Addendum Note (Signed)
Addended by: Lois Huxley, Jeannett Senior L on: 10/25/2020 11:57 AM   Modules accepted: Orders

## 2020-10-25 NOTE — Progress Notes (Addendum)
   S:    PCP: Dr. Laural Benes  Patient arrives in good spirits. Presents to the clinic for hypertension evaluation, counseling, and management. Patient was referred and last seen by Primary Care Provider on 10/10/2019.    Medication adherence reported.  Current BP Medications include:  HCTZ 12.5 mg daily, lisinopril 5 mg daily  Dietary habits include: compliant with salt restriction; denies drinking caffeine  Exercise habits include: walks 10 minutes daily  Family / Social history:  - FHx: DM, glaucoma  - Tobacco: never smoker  - Alcohol: denies use  O:  Vitals:   10/25/20 0933  BP: 134/79   Home BP readings: none   Last 3 Office BP readings: BP Readings from Last 3 Encounters:  06/02/20 (!) 153/85  05/28/20 120/76  10/24/19 120/66    BMET    Component Value Date/Time   NA 138 05/28/2020 1035   K 4.2 05/28/2020 1035   CL 100 05/28/2020 1035   CO2 24 05/28/2020 1035   GLUCOSE 100 (H) 05/28/2020 1035   BUN 15 05/28/2020 1035   CREATININE 0.68 05/28/2020 1035   CALCIUM 9.8 05/28/2020 1035   GFRNONAA 88 05/28/2020 1035   GFRAA 101 05/28/2020 1035    Renal function: CrCl cannot be calculated (Patient's most recent lab result is older than the maximum 21 days allowed.).  Clinical ASCVD: No  The 10-year ASCVD risk score Denman George DC Jr., et al., 2013) is: 37.8%   Values used to calculate the score:     Age: 75 years     Sex: Female     Is Non-Hispanic African American: No     Diabetic: Yes     Tobacco smoker: No     Systolic Blood Pressure: 153 mmHg     Is BP treated: Yes     HDL Cholesterol: 57 mg/dL     Total Cholesterol: 218 mg/dL  A/P: Hypertension longstanding currently close to goal on current medications. SBP Goal = < 130 mmHg. Medication adherence reported.  -Continued current regimen.  -Counseled on lifestyle modifications for blood pressure control including reduced dietary sodium, increased exercise, adequate sleep. -HM: PNA-23, Tdap given  Results  reviewed and written information provided. Total time in face-to-face counseling 15 minutes.   F/U Clinic Visit with PCP.    Butch Penny, PharmD, P[place]y [place], CPP Clinical Pharmacist [facility] [phone]

## 2020-11-26 ENCOUNTER — Other Ambulatory Visit: Payer: Self-pay | Admitting: Internal Medicine

## 2020-11-26 DIAGNOSIS — I10 Essential (primary) hypertension: Secondary | ICD-10-CM

## 2020-11-26 MED ORDER — LISINOPRIL 5 MG PO TABS: 5.0000 mg | ORAL_TABLET | Freq: Every day | ORAL | 0 refills | Status: DC

## 2020-11-26 NOTE — Telephone Encounter (Signed)
Medication: lisinopril (ZESTRIL) 5 MG tablet [[id number]]  requesting a 3 month supply  Has the patient contacted their pharmacy? YES (Agent: If no, request that the patient contact the pharmacy for the refill.) (Agent: If yes, when and what did the pharmacy advise?)  Preferred Pharmacy (with phone number or street name): [id number] [place], [place] - [address] AT [facility] [address] [place] [address] Phone: [phone] Fax: [phone] Hours: Not open 24 hours    Agent: Please be advised that RX refills may take up to 3 business days. We ask that you follow-up with your pharmacy.

## 2021-03-10 ENCOUNTER — Telehealth: Payer: Self-pay

## 2021-03-10 NOTE — Telephone Encounter (Signed)
Called pt to schedule annual wellness visit, no answer l left a message for her to schedule at [facility] and wellness [phone]

## 2021-03-28 ENCOUNTER — Encounter: Payer: Self-pay | Admitting: Internal Medicine

## 2021-03-28 ENCOUNTER — Ambulatory Visit: Payer: Medicare HMO | Attending: Internal Medicine | Admitting: Internal Medicine

## 2021-03-28 ENCOUNTER — Other Ambulatory Visit: Payer: Self-pay

## 2021-03-28 VITALS — BP 123/82 | HR 91 | Resp 16 | Wt 174.4 lb

## 2021-03-28 DIAGNOSIS — Z6839 Body mass index (BMI) 39.0-39.9, adult: Secondary | ICD-10-CM

## 2021-03-28 DIAGNOSIS — T783XXA Angioneurotic edema, initial encounter: Secondary | ICD-10-CM | POA: Diagnosis not present

## 2021-03-28 DIAGNOSIS — E1159 Type 2 diabetes mellitus with other circulatory complications: Secondary | ICD-10-CM | POA: Diagnosis not present

## 2021-03-28 DIAGNOSIS — I4821 Permanent atrial fibrillation: Secondary | ICD-10-CM | POA: Diagnosis not present

## 2021-03-28 DIAGNOSIS — I10 Essential (primary) hypertension: Secondary | ICD-10-CM | POA: Diagnosis not present

## 2021-03-28 DIAGNOSIS — E1169 Type 2 diabetes mellitus with other specified complication: Secondary | ICD-10-CM | POA: Diagnosis not present

## 2021-03-28 DIAGNOSIS — R140 Abdominal distension (gaseous): Secondary | ICD-10-CM

## 2021-03-28 DIAGNOSIS — E669 Obesity, unspecified: Secondary | ICD-10-CM | POA: Diagnosis not present

## 2021-03-28 DIAGNOSIS — Z23 Encounter for immunization: Secondary | ICD-10-CM | POA: Diagnosis not present

## 2021-03-28 LAB — POCT GLYCOSYLATED HEMOGLOBIN (HGB A1C): HbA1c, POC (controlled diabetic range): 6.5 % (ref 0.0–7.0)

## 2021-03-28 LAB — GLUCOSE, POCT (MANUAL RESULT ENTRY): POC Glucose: 126 mg/dl — AB (ref 70–99)

## 2021-03-28 MED ORDER — RIVAROXABAN 20 MG PO TABS: 20.0000 mg | ORAL_TABLET | Freq: Every day | ORAL | 1 refills | Status: DC

## 2021-03-28 MED ORDER — ISOSORBIDE MONONITRATE ER 30 MG PO TB24: 15.0000 mg | ORAL_TABLET | Freq: Every day | ORAL | 1 refills | Status: DC

## 2021-03-28 MED ORDER — FAMOTIDINE 20 MG PO TABS: 20.0000 mg | ORAL_TABLET | Freq: Two times a day (BID) | ORAL | 0 refills | Status: DC

## 2021-03-28 MED ORDER — PREDNISONE 20 MG PO TABS: 20.0000 mg | ORAL_TABLET | Freq: Every day | ORAL | 0 refills | Status: DC

## 2021-03-28 MED ORDER — METFORMIN HCL 500 MG PO TABS: 500.0000 mg | ORAL_TABLET | Freq: Two times a day (BID) | ORAL | 1 refills | Status: DC

## 2021-03-28 MED ORDER — AMLODIPINE BESYLATE 5 MG PO TABS: 5.0000 mg | ORAL_TABLET | Freq: Every day | ORAL | 1 refills | Status: DC

## 2021-03-28 MED ORDER — HYDROCHLOROTHIAZIDE 12.5 MG PO CAPS: 12.5000 mg | ORAL_CAPSULE | Freq: Every day | ORAL | 1 refills | Status: DC

## 2021-03-28 NOTE — Progress Notes (Signed)
Patient ID: Emily Hamilton, female    DOB: 11/03/1945  MRN: [id number]  CC: Diabetes and Abdominal Pain   Subjective: Emily Hamilton is a 75 y.o. female who presents for chronic ds management Her concerns today include:  Patient with history of DM type II, HL, HTN, osteopenia, permanent atrial fibrillation, Vit D def  C/o problems with allergies. Reports swelling of lips that comes and goes x 3 mths.  No swelling of tongue or throat Currently has swelling of lips that started last night. Some itching of lips at times.  No known food allergies   DIABETES TYPE 2/Obesity Last A1C:   Results for orders placed or performed in visit on 03/28/21  POCT glucose (manual entry)  Result Value Ref Range   POC Glucose 126 (A) 70 - 99 mg/dl  POCT glycosylated hemoglobin (Hb A1C)  Result Value Ref Range   Hemoglobin A1C     HbA1c POC (<> result, manual entry)     HbA1c, POC (prediabetic range)     HbA1c, POC (controlled diabetic range) 6.5 0.0 - 7.0 %    Med Adherence:  [x]  Yes -taking Metformin 500 mg BID (written as 1 gram BID)   []  No Medication side effects:  []  Yes    [x]  No Home Monitoring?  []  Yes    [x]  No Home glucose results range: Diet Adherence: [x]  Yes    []  No Exercise: [x]  Yes  -walks 10 mins 3x/wk  Hypoglycemic episodes?: []  Yes    [x]  No Numbness of the feet? []  Yes    [x]  No Retinopathy hx? []  Yes    []  No Last eye exam: over due for eye exam Comments:  no longer needs Gabapentin  HYPERTENSION Currently taking: see medication list.  She is on lisinopril, HCTZ and isosorbide. Med Adherence: [x]  Yes    []  No Medication side effects: []  Yes    [x]  No Adherence with salt restriction: [x]  Yes    []  No Home Monitoring?: []  Yes    [x]  No but reports she does have a device Monitoring Frequency:  Home BP results range:  SOB? []  Yes    [x]  No Chest Pain?: []  Yes    [x]  No Leg swelling?: []  Yes     No Headaches?: []  Yes    [] [x]  No Dizziness? []  Yes    [x]   No Comments:   A.Fib:  compliant with Xarelto.  No bruising or bleeding.  No palpitations.   On last visit with me in March of this year, she complained of some abdominal bloating with certain foods and symptoms to suggest acid reflux.  H. pylori test was positive.  She was treated.  She completed treatment.  Placed on Pepcid at bedtime.  Patient reports she still has bloating at times.   HM:  due for flu shot and Shingrix Patient Active Problem List   Diagnosis Date Noted   Atrial fibrillation (HCC) 10/13/2020   Hyperlipidemia LDL goal <70 10/18/2018   Osteopenia 10/18/2018   Type 2 diabetes mellitus without complication, without long-term current use of insulin (HCC) 09/08/2018     Current Outpatient Medications on File Prior to Visit  Medication Sig Dispense Refill   amoxicillin (AMOXIL) 500 MG tablet Take 2 tablets (1,000 mg total) by mouth 2 (two) times daily. 28 tablet 0   atorvastatin (LIPITOR) 40 MG tablet TOME UNA TABLETA TODOS LOS DIAS (Patient not taking: Reported on 10/09/2020) 90 tablet 1   clarithromycin (BIAXIN) 500  MG tablet Take 1 tablet (500 mg total) by mouth 2 (two) times daily. 14 tablet 0   famotidine (PEPCID) 20 MG tablet Take 1 tablet (20 mg total) by mouth at bedtime. 30 tablet 3   gabapentin (NEURONTIN) 100 MG capsule Take 1 capsule (100 mg total) by mouth at bedtime. For tingling in feet (Patient not taking: Reported on 10/09/2020) 90 capsule 3   hydrochlorothiazide (MICROZIDE) 12.5 MG capsule TOME UNA CAPSULA TODOS LOS DIAS (Patient not taking: Reported on 10/09/2020) 90 capsule 3   isosorbide mononitrate (IMDUR) 30 MG 24 hr tablet Take 0.5 tablets (15 mg total) by mouth daily. (Patient not taking: Reported on 10/09/2020) 45 tablet 3   lisinopril (ZESTRIL) 5 MG tablet Take 1 tablet (5 mg total) by mouth daily. 90 tablet 0   metFORMIN (GLUCOPHAGE) 500 MG tablet Take 2 tablets (1,000 mg total) by mouth 2 (two) times daily with a meal. (Patient not taking: Reported on  10/09/2020) 120 tablet 1   omeprazole (PRILOSEC) 20 MG capsule Take 1 capsule (20 mg total) by mouth 2 (two) times daily before a meal. 14 capsule 0   rivaroxaban (XARELTO) 20 MG TABS tablet Take 1 tablet (20 mg total) by mouth daily with supper. (Patient not taking: Reported on 10/09/2020) 90 tablet 1   No current facility-administered medications on file prior to visit.    No Known Allergies  Social History   Socioeconomic History   Marital status: Married    Spouse name: Not on file   Number of children: Not on file   Years of education: Not on file   Highest education level: Not on file  Occupational History   Not on file  Tobacco Use   Smoking status: Never   Smokeless tobacco: Never  Vaping Use   Vaping Use: Never used  Substance and Sexual Activity   Alcohol use: Never   Drug use: Never   Sexual activity: Never  Other Topics Concern   Not on file  Social History Narrative   Not on file   Social Determinants of Health   Financial Resource Strain: Not on file  Food Insecurity: Not on file  Transportation Needs: Not on file  Physical Activity: Not on file  Stress: Not on file  Social Connections: Not on file  Intimate Partner Violence: Not on file    Family History  Problem Relation Age of Onset   Diabetes Mother    Glaucoma Mother     Past Surgical History:  Procedure Laterality Date   BREAST EXCISIONAL BIOPSY Left    CHOLECYSTECTOMY     Right knee replacement      ROS: Review of Systems Negative except as stated above  PHYSICAL EXAM: BP 123/82   Pulse 91   Resp 16   Wt 174 lb 6.4 oz (79.1 kg)   SpO2 97%   BMI 39.10 kg/m   Wt Readings from Last 3 Encounters:  03/28/21 174 lb 6.4 oz (79.1 kg)  06/02/20 165 lb (74.8 kg)  05/28/20 162 lb 9.6 oz (73.8 kg)    Physical Exam  General appearance - alert, well appearing, elderly Hispanic female and in no distress Mental status - normal mood, behavior, speech, dress, motor activity, and thought  processes Mouth -she has mild edema of the upper lip.  No edema of the tongue or the throat. Neck - supple, no significant adenopathy Chest - clear to auscultation, no wheezes, rales or rhonchi, symmetric air entry Heart - normal rate, regular rhythm, normal  S1, S2, no murmurs, rubs, clicks or gallops Abdomen - soft, nontender, nondistended, no masses or organomegaly Extremities - peripheral pulses normal, no pedal edema, no clubbing or cyanosis Diabetic Foot Exam - Simple   Simple Foot Form Visual Inspection See comments: Yes Sensation Testing Intact to touch and monofilament testing bilaterally: Yes Pulse Check Posterior Tibialis and Dorsalis pulse intact bilaterally: Yes Comments Skin dry. Toenails are overgrown. Nail on big toes discolored.      CMP Latest Ref Rng & Units 05/28/2020 08/05/2019 01/20/2019  Glucose 65 - 99 mg/dL 562(B) 98 87  BUN 8 - 27 mg/dL 15 9 14   Creatinine 0.57 - 1.00 mg/dL 6.38 9.37  Sodium 134 - 144 mmol/L 138 140 140  Potassium 3.5 - 5.2 mmol/L 4.2 4.1 4.1  Chloride 96 - 106 mmol/L 100 102 100  CO2 20 - 29 mmol/L 24 26 23   Calcium 8.7 - 10.3 mg/dL 9.8 9.4 9.7  Total Protein 6.0 - 8.5 g/dL 8.0 7.3 7.6  Total Bilirubin 0.0 - 1.2 mg/dL 0.3 0.3 0.3  Alkaline Phos 44 - 121 IU/L 90 94 95  AST 0 - 40 IU/L 34 23 32  ALT 0 - 32 IU/L 27 16 20    Lipid Panel     Component Value Date/Time   CHOL 218 (H) 05/28/2020 1035   TRIG 215 (H) 05/28/2020 1035   HDL 57 05/28/2020 1035   CHOLHDL 3.8 05/28/2020 1035   LDLCALC 123 (H) 05/28/2020 1035    CBC    Component Value Date/Time   WBC 7.2 10/11/2020 1017   RBC 4.78 10/11/2020 1017   HGB 13.6 10/11/2020 1017   HCT 41.5 10/11/2020 1017   PLT 245 10/11/2020 1017   MCV 87 10/11/2020 1017   MCH 28.5 10/11/2020 1017   MCHC 32.8 10/11/2020 1017   RDW 14.6 10/11/2020 1017   LYMPHSABS 1.8 09/08/2018 1100   EOSABS 0.1 09/08/2018 1100   BASOSABS 0.0 09/08/2018 1100    ASSESSMENT AND PLAN:  1. Type 2  diabetes mellitus with obesity (HCC) At goal.  Continue healthy eating habits and trying to move as much as she can.  Continue metformin. - POCT glucose (manual entry) - POCT glycosylated hemoglobin (Hb A1C) - Ambulatory referral to Ophthalmology - metFORMIN (GLUCOPHAGE) 500 MG tablet; Take 1 tablet (500 mg total) by mouth 2 (two) times daily with a meal.  Dispense: 180 tablet; Refill: 1 - CBC - Comprehensive metabolic panel - Lipid panel  2. Essential hypertension Close to goal.  Stop lisinopril as I think it is causing the angioedema.  We will add low-dose of amlodipine instead.  Continue HCTZ and isosorbide - hydrochlorothiazide (MICROZIDE) 12.5 MG capsule; Take 1 capsule (12.5 mg total) by mouth daily. TOME UNA CAPSULA TODOS LOS DIAS  Dispense: 90 capsule; Refill: 1 - isosorbide mononitrate (IMDUR) 30 MG 24 hr tablet; Take 0.5 tablets (15 mg total) by mouth daily.  Dispense: 45 tablet; Refill: 1 - amLODipine (NORVASC) 5 MG tablet; Take 1 tablet (5 mg total) by mouth daily.  Dispense: 90 tablet; Refill: 1  3. Permanent atrial fibrillation (HCC) - rivaroxaban (XARELTO) 20 MG TABS tablet; Take 1 tablet (20 mg total) by mouth daily with supper.  Dispense: 90 tablet; Refill: 1  4. Angioedema of lips, initial encounter See #2 above. I will placed on prednisone and famotidine for 3 days.  Advised patient that if the swelling becomes worse or she develops swelling of the tongue or throat or problems breathing she should be seen  in the emergency room immediately.  Stop lisinopril. - predniSONE (DELTASONE) 20 MG tablet; Take 1 tablet (20 mg total) by mouth daily with breakfast.  Dispense: 3 tablet; Refill: 0 - famotidine (PEPCID) 20 MG tablet; Take 1 tablet (20 mg total) by mouth 2 (two) times daily.  Dispense: 6 tablet; Refill: 0  5. Abdominal bloating Recheck H. pylori test - H. pylori breath test  6. Need for immunization against influenza - Flu Vaccine QUAD 74mo+IM (Fluarix, Fluzone &  Alfiuria Quad PF)    Patient was given the opportunity to ask questions.  Patient verbalized understanding of the plan and was able to repeat key elements of the plan.  [employer] Language interpreter used during this encounter. #[id number], Byrd Hesselbach  Orders Placed This Encounter  Procedures   POCT glucose (manual entry)   POCT glycosylated hemoglobin (Hb A1C)     Requested Prescriptions    No prescriptions requested or ordered in this encounter    No follow-ups on file.  Jonah Blue, MD, FACP

## 2021-03-28 NOTE — Patient Instructions (Addendum)
Stop Lisinopril. Take Prednisone and Famotidine as prescribed today. Be seen in the emergency room if swelling gets worse or if you develops any swelling of tongue, throat or shortness of breath.

## 2021-03-29 ENCOUNTER — Telehealth: Payer: Self-pay

## 2021-03-29 LAB — COMPREHENSIVE METABOLIC PANEL
ALT: 40 IU/L — ABNORMAL HIGH (ref 0–32)
AST: 54 IU/L — ABNORMAL HIGH (ref 0–40)
Albumin/Globulin Ratio: 1.4 (ref 1.2–2.2)
Albumin: 4.6 g/dL (ref 3.7–4.7)
Alkaline Phosphatase: 116 IU/L (ref 44–121)
BUN/Creatinine Ratio: 21 (ref 12–28)
BUN: 15 mg/dL (ref 8–27)
Bilirubin Total: 0.5 mg/dL (ref 0.0–1.2)
CO2: 22 mmol/L (ref 20–29)
Calcium: 9.2 mg/dL (ref 8.7–10.3)
Chloride: 103 mmol/L (ref 96–106)
Creatinine, Ser: 0.72 mg/dL (ref 0.57–1.00)
Globulin, Total: 3.4 g/dL (ref 1.5–4.5)
Glucose: 112 mg/dL — ABNORMAL HIGH (ref 65–99)
Potassium: 4.2 mmol/L (ref 3.5–5.2)
Sodium: 141 mmol/L (ref 134–144)
Total Protein: 8 g/dL (ref 6.0–8.5)
eGFR: 88 mL/min/{1.73_m2} (ref >59)

## 2021-03-29 LAB — LIPID PANEL
Chol/HDL Ratio: 3.2 ratio (ref 0.0–4.4)
Cholesterol, Total: 155 mg/dL (ref 100–199)
HDL: 49 mg/dL (ref >39)
LDL Chol Calc (NIH): 75 mg/dL (ref 0–99)
Triglycerides: 185 mg/dL — ABNORMAL HIGH (ref 0–149)
VLDL Cholesterol Cal: 31 mg/dL (ref 5–40)

## 2021-03-29 LAB — CBC
Hematocrit: 41.4 % (ref 34.0–46.6)
Hemoglobin: 14.2 g/dL (ref 11.1–15.9)
MCH: 29.2 pg (ref 26.6–33.0)
MCHC: 34.3 g/dL (ref 31.5–35.7)
MCV: 85 fL (ref 79–97)
Platelets: 214 10*3/uL (ref 150–450)
RBC: 4.87 x10E6/uL (ref 3.77–5.28)
RDW: 13.7 % (ref 11.7–15.4)
WBC: 7.4 10*3/uL (ref 3.4–10.8)

## 2021-03-29 IMAGING — MG DIGITAL SCREENING BILAT W/ TOMO W/ CAD
8 series · 8 of 24 positions shown · non-contrast
Comparison: Previous exam(s).

CLINICAL DATA: Screening.

EXAM:
DIGITAL SCREENING BILATERAL MAMMOGRAM WITH TOMO AND CAD

[R CC synth-2D]
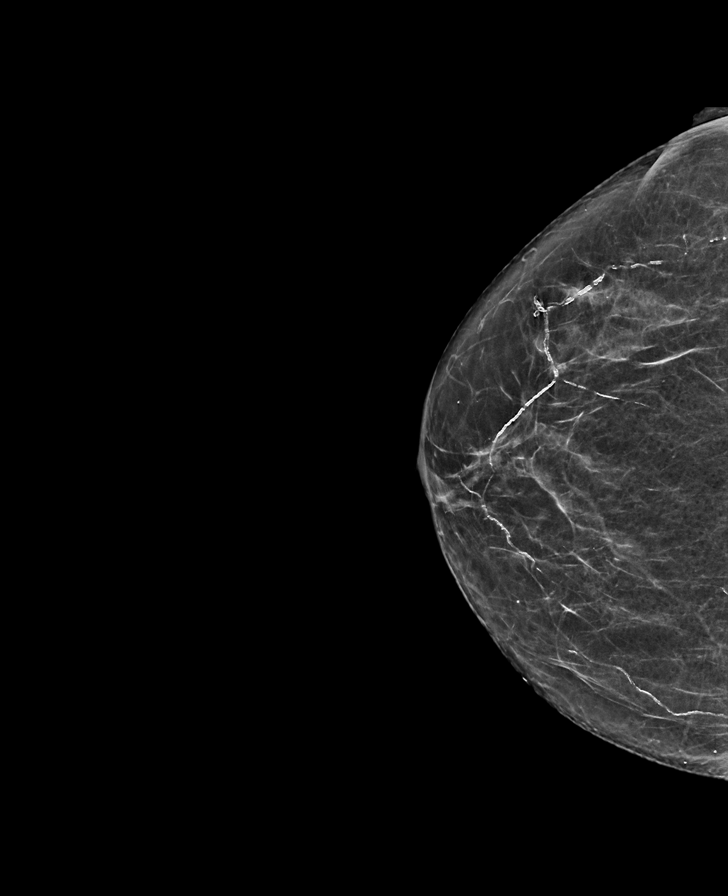

[L MLO synth-2D]
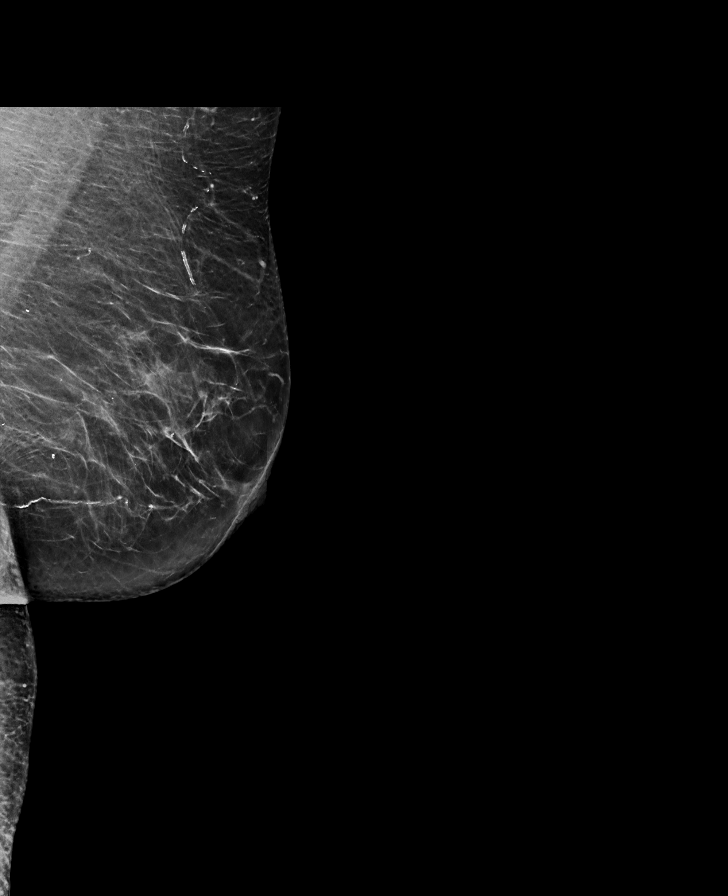

[R MLO synth-2D]
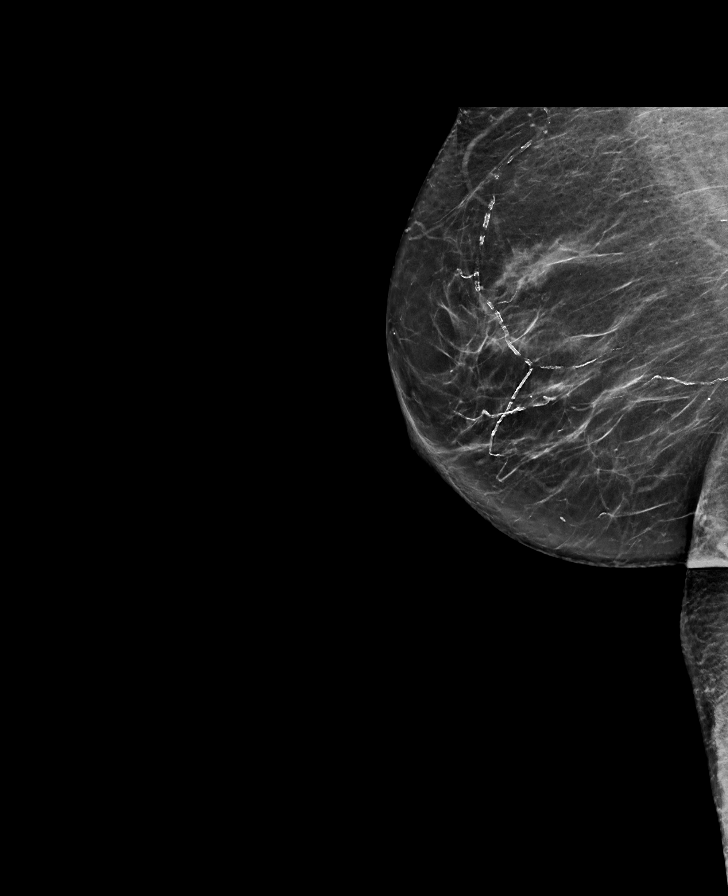

[L CC synth-2D]
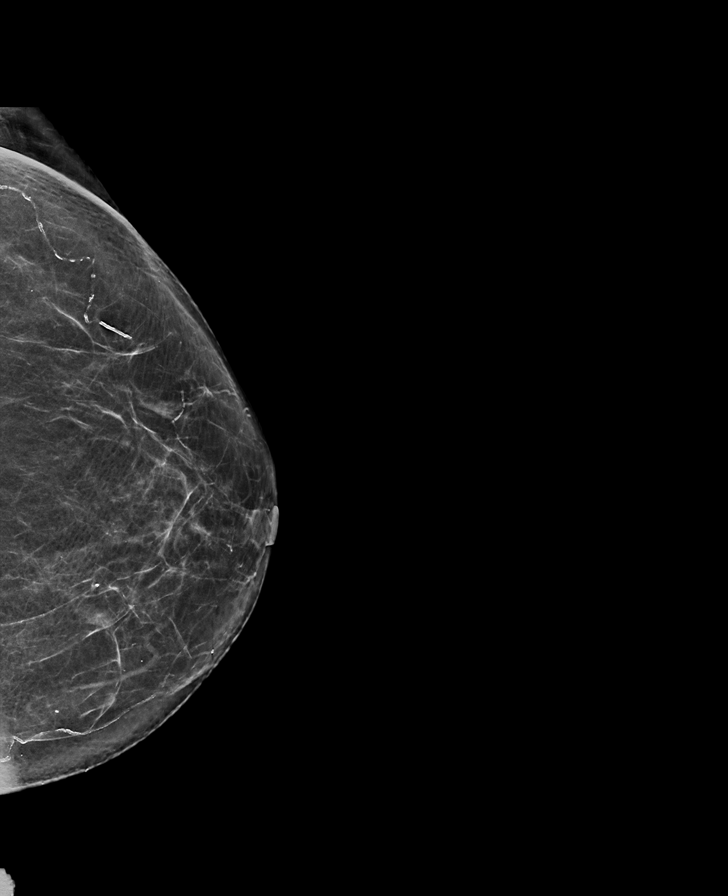

[R CC tomo · tomo slice 33/64.0]
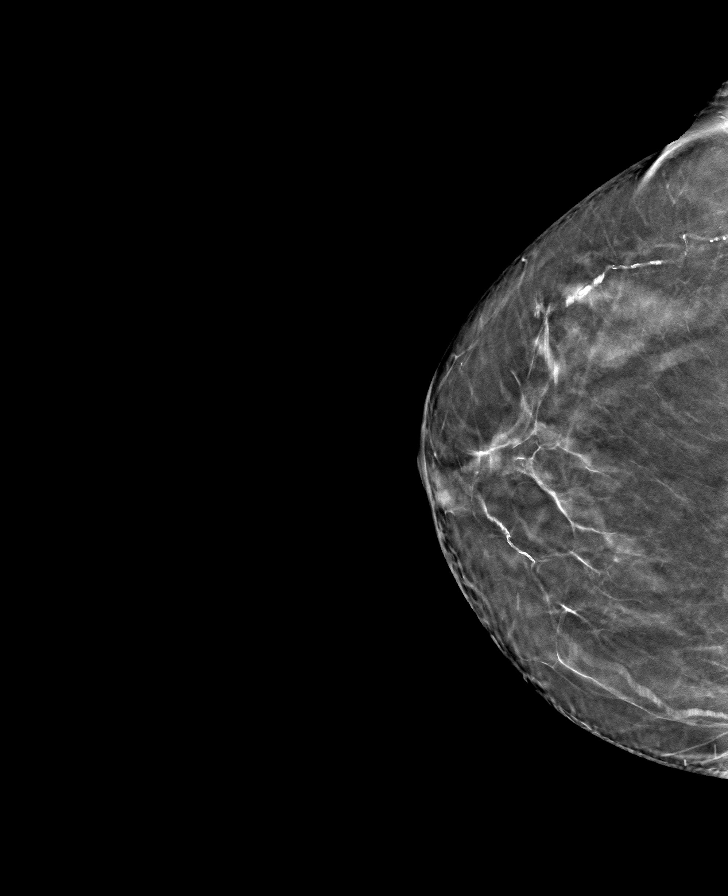

[R MLO tomo · tomo slice 37/72.0]
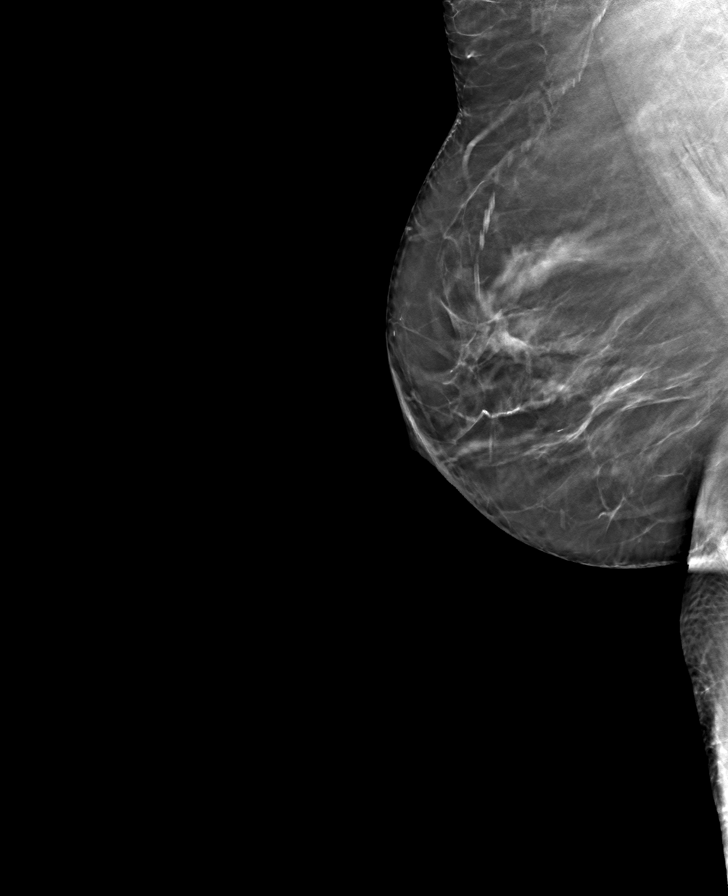

[L MLO tomo · tomo slice 37/74.0]
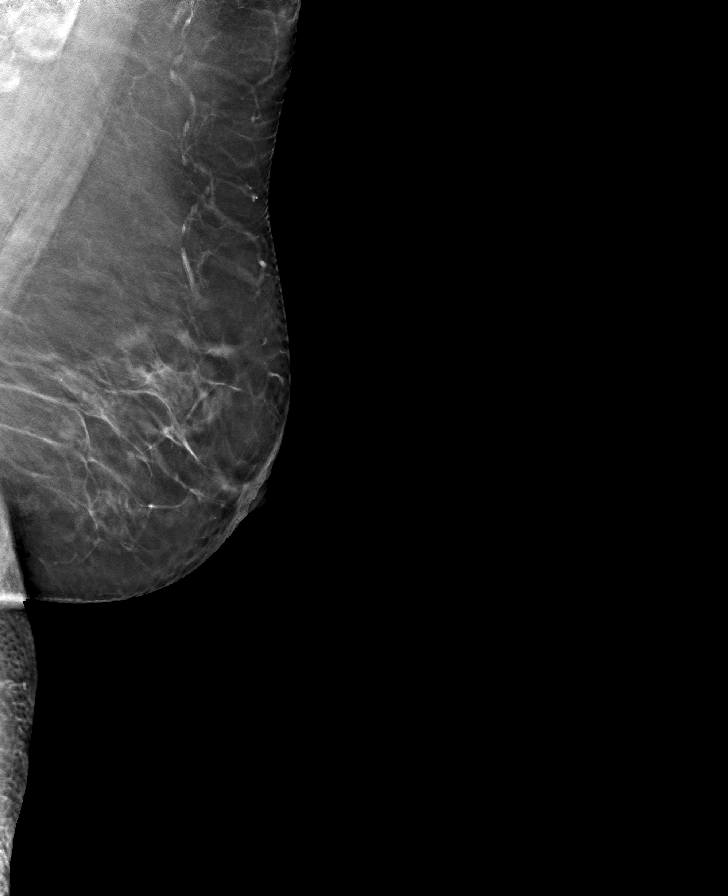

[L CC tomo · tomo slice 35/68.0]
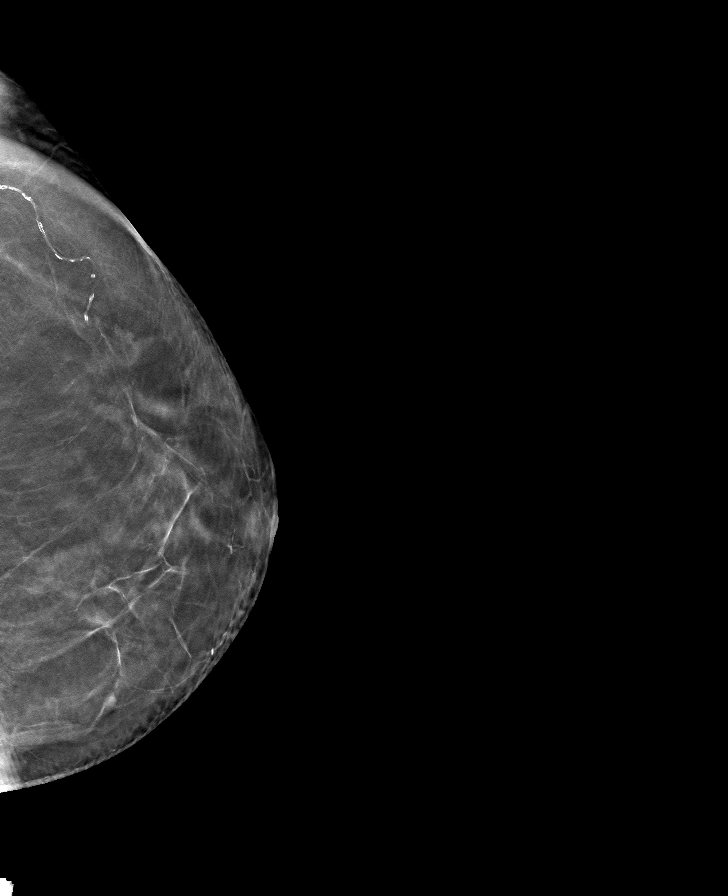

[8 of 24 positions shown; findings below may reference images not displayed]

ACR Breast Density Category b: There are scattered areas of
fibroglandular density.
FINDINGS: There are no findings suspicious for malignancy. Images were
processed with CAD.
IMPRESSION: No mammographic evidence of malignancy. A result letter of this
screening mammogram will be mailed directly to the patient.

RECOMMENDATION:
Screening mammogram in one year. (Code:[id number])

BI-RADS CATEGORY  1: Negative.

## 2021-03-29 NOTE — Telephone Encounter (Signed)
Contacted pt to go over lab results pt didn't answer lvm  [employer] interperter: josbel ID: [id number] Sent a CRM and forward labs to NT to give pt labs when they call back   

## 2021-03-29 NOTE — Progress Notes (Signed)
Blood cell counts are normal.  Kidney function is good.  Mild elevation in liver function test which is likely due to the atorvastatin.  We will monitor for now.  Cholesterol levels good.  She can return to the lab in about 2 weeks to do the bacteria breath test.  The lab did not have the supplies to do the test when she was seen yesterday.

## 2021-03-30 ENCOUNTER — Ambulatory Visit (HOSPITAL_BASED_OUTPATIENT_CLINIC_OR_DEPARTMENT_OTHER): Payer: Medicare HMO

## 2021-03-30 DIAGNOSIS — Z0000 Encounter for general adult medical examination without abnormal findings: Secondary | ICD-10-CM | POA: Diagnosis not present

## 2021-03-30 NOTE — Progress Notes (Signed)
Subjective:   Emily Hamilton is a 75 y.o. female who presents for an Initial Medicare Annual Wellness Visit.I connected with  Shadiamond Koska on 03/30/21 by a audio enabled telemedicine application and verified that I am speaking with the correct person using two identifiers.   I discussed the limitations of evaluation and management by telemedicine. The patient expressed understanding and agreed to proceed.   Location of patient: Home Location of provider: Office  Persons participating in visit: Emily Hamilton (patient) and Donnetta Hail, CMA  Review of Systems    Defer to PCP       Objective:    There were no vitals filed for this visit. There is no height or weight on file to calculate BMI.  Advanced Directives 03/30/2021 06/02/2020  Does Patient Have a Medical Advance Directive? No No  Would patient like information on creating a medical advance directive? Yes (ED - Information included in AVS) No - Patient declined    Current Medications (verified) Outpatient Encounter Medications as of 03/30/2021  Medication Sig   amLODipine (NORVASC) 5 MG tablet Take 1 tablet (5 mg total) by mouth daily.   famotidine (PEPCID) 20 MG tablet Take 1 tablet (20 mg total) by mouth 2 (two) times daily.   hydrochlorothiazide (MICROZIDE) 12.5 MG capsule Take 1 capsule (12.5 mg total) by mouth daily. TOME UNA CAPSULA TODOS LOS DIAS   isosorbide mononitrate (IMDUR) 30 MG 24 hr tablet Take 0.5 tablets (15 mg total) by mouth daily.   metFORMIN (GLUCOPHAGE) 500 MG tablet Take 1 tablet (500 mg total) by mouth 2 (two) times daily with a meal.   predniSONE (DELTASONE) 20 MG tablet Take 1 tablet (20 mg total) by mouth daily with breakfast.   rivaroxaban (XARELTO) 20 MG TABS tablet Take 1 tablet (20 mg total) by mouth daily with supper.   atorvastatin (LIPITOR) 40 MG tablet TOME UNA TABLETA TODOS LOS DIAS (Patient not taking: No sig reported)   No facility-administered encounter  medications on file as of 03/30/2021.    Allergies (verified) Lisinopril   History: Past Medical History:  Diagnosis Date   Controlled type 2 diabetes mellitus (HCC)    Family history of diabetes mellitus    Hyperlipidemia    Hypertension    Osteopenia    Status post right knee replacement    Past Surgical History:  Procedure Laterality Date   BREAST EXCISIONAL BIOPSY Left    CHOLECYSTECTOMY     Right knee replacement     Family History  Problem Relation Age of Onset   Diabetes Mother    Glaucoma Mother    Social History   Socioeconomic History   Marital status: Married    Spouse name: Not on file   Number of children: Not on file   Years of education: Not on file   Highest education level: Not on file  Occupational History   Not on file  Tobacco Use   Smoking status: Never   Smokeless tobacco: Never  Vaping Use   Vaping Use: Never used  Substance and Sexual Activity   Alcohol use: Never   Drug use: Never   Sexual activity: Never  Other Topics Concern   Not on file  Social History Narrative   Not on file   Social Determinants of Health   Financial Resource Strain: Low Risk    Difficulty of Paying Living Expenses: Not hard at all  Food Insecurity: No Food Insecurity   Worried About Running Out  of Food in the Last Year: Never true   Ran Out of Food in the Last Year: Never true  Transportation Needs: No Transportation Needs   Lack of Transportation (Medical): No   Lack of Transportation (Non-Medical): No  Physical Activity: Insufficiently Active   Days of Exercise per Week: 3 days   Minutes of Exercise per Session: 20 min  Stress: No Stress Concern Present   Feeling of Stress : Not at all  Social Connections: Socially Isolated   Frequency of Communication with Friends and Family: Once a week   Frequency of Social Gatherings with Friends and Family: Once a week   Attends Religious Services: Never   Database administrator or Organizations: No   Attends  Engineer, structural: Never   Marital Status: Separated    Tobacco Counseling Counseling given: Not Answered   Clinical Intake:  Pre-visit preparation completed: Yes  Pain : No/denies pain     Diabetes: Yes CBG done?: No CBG resulted in Enter/ Edit results?: No Did pt. bring in CBG monitor from home?: No  How often do you need to have someone help you when you read instructions, pamphlets, or other written materials from your doctor or pharmacy?: 1 - Never What is the last grade level you completed in school?: 6th grade  Diabetic?Yes  Interpreter Needed?: No  Information entered by :: Donnetta Hail, CMA   Activities of Daily Living In your present state of health, do you have any difficulty performing the following activities: 03/30/2021 05/28/2020  Hearing? N N  Vision? N N  Difficulty concentrating or making decisions? N N  Walking or climbing stairs? Y N  Dressing or bathing? N N  Doing errands, shopping? N N  Preparing Food and eating ? N N  Using the Toilet? N N  In the past six months, have you accidently leaked urine? Y N  Do you have problems with loss of bowel control? N N  Managing your Medications? N N  Managing your Finances? N N  Housekeeping or managing your Housekeeping? N N  Some recent data might be hidden    Patient Care Team: Marcine Matar, MD as PCP - General (Internal Medicine) Parke Poisson, MD as PCP - Cardiology (Cardiology)  Indicate any recent Medical Services you may have received from other than [facility] providers in the past year (date may be approximate).     Assessment:   This is a routine wellness examination for Emily Hamilton.  Hearing/Vision screen No results found.  Dietary issues and exercise activities discussed:     Goals Addressed   None    Depression Screen PHQ 2/9 Scores 03/30/2021 03/28/2021 05/28/2020 03/16/2020 11/24/2018 09/08/2018 03/24/2018  PHQ - 2 Score 2 2 0 0 0 1 0  PHQ- 9 Score 5 5 - - 0 - 0     Fall Risk Fall Risk  03/30/2021 03/28/2021 10/09/2020 05/28/2020 05/28/2020  Falls in the past year? 0 0 0 0 0  Number falls in past yr: 0 0 0 - 0  Injury with Fall? 0 0 0 - 0  Risk for fall due to : No Fall Risks No Fall Risks - No Fall Risks -  Follow up Falls evaluation completed - - Falls evaluation completed;Falls prevention discussed -    FALL RISK PREVENTION PERTAINING TO THE HOME:  Any stairs in or around the home? No  If so, are there any without handrails?  N/A Home free of loose throw rugs in walkways,  pet beds, electrical cords, etc? Yes  Adequate lighting in your home to reduce risk of falls? Yes   ASSISTIVE DEVICES UTILIZED TO PREVENT FALLS:  Life alert? No  Use of a cane, walker or w/c? No  Grab bars in the bathroom? No  Shower chair or bench in shower? No  Elevated toilet seat or a handicapped toilet? No   TIMED UP AND GO:  Was the test performed?  N/A .  Length of time to ambulate 10 feet: N/A sec.     Cognitive Function: MMSE - Mini Mental State Exam 05/28/2020  Orientation to time 5  Orientation to Place 5  Registration 3  Attention/ Calculation 5  Recall 3  Language- name 2 objects 2  Language- repeat 1  Language- follow 3 step command 3  Language- read & follow direction 1  Write a sentence 1  Copy design 1  Total score 30     6CIT Screen 03/30/2021  What Year? 0 points  What month? 0 points  What time? 0 points  Count back from 20 0 points  Months in reverse 0 points  Repeat phrase 10 points  Total Score 10    Immunizations Immunization History  Administered Date(s) Administered   Influenza,inj,Quad PF,6+ Mos 03/24/2018, 08/05/2019, 05/28/2020, 03/28/2021   PFIZER(Purple Top)SARS-COV-2 Vaccination 11/16/2019, 01/11/2020   Pneumococcal Polysaccharide-23 10/25/2020   Tdap 10/25/2020    TDAP status: Up to date  Flu Vaccine status: Up to date  Covid-19 vaccine status: Completed vaccines  Qualifies for Shingles Vaccine? Yes    Zostavax completed No   Shingrix Completed?: No.    Education has been provided regarding the importance of this vaccine. Patient has been advised to call insurance company to determine out of pocket expense if they have not yet received this vaccine. Advised may also receive vaccine at local pharmacy or Health Dept. Verbalized acceptance and understanding.  Screening Tests Health Maintenance  Topic Date Due   Hepatitis C Screening  Never done   Zoster Vaccines- Shingrix (1 of 2) Never done   COVID-19 Vaccine (3 - Pfizer risk series) 02/08/2020   OPHTHALMOLOGY EXAM  11/29/2020   URINE MICROALBUMIN  05/28/2021   FOOT EXAM  05/28/2021   HEMOGLOBIN A1C  09/25/2021   MAMMOGRAM  04/13/2022   COLONOSCOPY (Pts 45-21yrs Insurance coverage will need to be confirmed)  05/20/2028   TETANUS/TDAP  10/26/2030   INFLUENZA VACCINE  Completed   DEXA SCAN  Completed   HPV VACCINES  Aged Out    Health Maintenance  Health Maintenance Due  Topic Date Due   Hepatitis C Screening  Never done   Zoster Vaccines- Shingrix (1 of 2) Never done   COVID-19 Vaccine (3 - Pfizer risk series) 02/08/2020   OPHTHALMOLOGY EXAM  11/29/2020   URINE MICROALBUMIN  05/28/2021    Colorectal cancer screening: Type of screening: Colonoscopy. Completed 05/20/2018. Repeat every 10 years  Mammogram status: Completed 04/13/2020. Repeat every year  Bone Density status: Completed 11/03/2019. Results reflect: Bone density results: OSTEOPENIA. Repeat every N/A years.  Lung Cancer Screening: (Low Dose CT Chest recommended if Age 77-80 years, 30 pack-year currently smoking OR have quit w/in 15years.) does not qualify.   Lung Cancer Screening Referral: No  Additional Screening:  Hepatitis C Screening: does qualify; Completed Overdue, never done.  Vision Screening: Recommended annual ophthalmology exams for early detection of glaucoma and other disorders of the eye. Is the patient up to date with their annual eye exam?  Yes   Who  is the provider or what is the name of the office in which the patient attends annual eye exams? N/A If pt is not established with a provider, would they like to be referred to a provider to establish care? No .   Dental Screening: Recommended annual dental exams for proper oral hygiene  Community Resource Referral / Chronic Care Management: CRR required this visit?  No   CCM required this visit?  No      Plan:     I have personally reviewed and noted the following in the patient's chart:   Medical and social history Use of alcohol, tobacco or illicit drugs  Current medications and supplements including opioid prescriptions. Patient is not currently taking opioid prescriptions. Functional ability and status Nutritional status Physical activity Advanced directives List of other physicians Hospitalizations, surgeries, and ER visits in previous 12 months Vitals Screenings to include cognitive, depression, and falls Referrals and appointments  In addition, I have reviewed and discussed with patient certain preventive protocols, quality metrics, and best practice recommendations. A written personalized care plan for preventive services as well as general preventive health recommendations were provided to patient.     Donnetta Hail, [facility]   03/30/2021   Nurse Notes: Non-Face to Face 60 minute visit Encounter.  [name] , Thank you for taking time to come for your Medicare Wellness Visit. I appreciate your ongoing commitment to your health goals. Please review the following plan we discussed and let me know if I can assist you in the future.   These are the goals we discussed:  Goals   None     This is a list of the screening recommended for you and due dates:  Health Maintenance  Topic Date Due   Hepatitis C Screening: USPSTF Recommendation to screen - Ages 71-79 yo.  Never done   Zoster (Shingles) Vaccine (1 of 2) Never done   COVID-19 Vaccine (3 - Pfizer  risk series) 02/08/2020   Eye exam for diabetics  11/29/2020   Urine Protein Check  05/28/2021   Complete foot exam   05/28/2021   Hemoglobin A1C  09/25/2021   Mammogram  04/13/2022   Colon Cancer Screening  05/20/2028   Tetanus Vaccine  10/26/2030   Flu Shot  Completed   DEXA scan (bone density measurement)  Completed   HPV Vaccine  Aged Out

## 2021-03-30 NOTE — Patient Instructions (Signed)
Health Maintenance, Female Adopting a healthy lifestyle and getting preventive care are important in promoting health and wellness. Ask your health care provider about: The right schedule for you to have regular tests and exams. Things you can do on your own to prevent diseases and keep yourself healthy. What should I know about diet, weight, and exercise? Eat a healthy diet  Eat a diet that includes plenty of vegetables, fruits, low-fat dairy products, and lean protein. Do not eat a lot of foods that are high in solid fats, added sugars, or sodium. Maintain a healthy weight Body mass index (BMI) is used to identify weight problems. It estimates body fat based on height and weight. Your health care provider can help determine your BMI and help you achieve or maintain a healthy weight. Get regular exercise Get regular exercise. This is one of the most important things you can do for your health. Most adults should: Exercise for at least 150 minutes each week. The exercise should increase your heart rate and make you sweat (moderate-intensity exercise). Do strengthening exercises at least twice a week. This is in addition to the moderate-intensity exercise. Spend less time sitting. Even light physical activity can be beneficial. Watch cholesterol and blood lipids Have your blood tested for lipids and cholesterol at 73 years of age, then have this test every 5 years. Have your cholesterol levels checked more often if: Your lipid or cholesterol levels are high. You are older than 73 years of age. You are at high risk for heart disease. What should I know about cancer screening? Depending on your health history and family history, you may need to have cancer screening at various ages. This may include screening for: Breast cancer. Cervical cancer. Colorectal cancer. Skin cancer. Lung cancer. What should I know about heart disease, diabetes, and high blood pressure? Blood pressure and heart  disease High blood pressure causes heart disease and increases the risk of stroke. This is more likely to develop in people who have high blood pressure readings, are of African descent, or are overweight. Have your blood pressure checked: Every 3-5 years if you are 18-39 years of age. Every year if you are 40 years old or older. Diabetes Have regular diabetes screenings. This checks your fasting blood sugar level. Have the screening done: Once every three years after age 40 if you are at a normal weight and have a low risk for diabetes. More often and at a younger age if you are overweight or have a high risk for diabetes. What should I know about preventing infection? Hepatitis B If you have a higher risk for hepatitis B, you should be screened for this virus. Talk with your health care provider to find out if you are at risk for hepatitis B infection. Hepatitis C Testing is recommended for: Everyone born from 1945 through 1965. Anyone with known risk factors for hepatitis C. Sexually transmitted infections (STIs) Get screened for STIs, including gonorrhea and chlamydia, if: You are sexually active and are younger than 73 years of age. You are older than 73 years of age and your health care provider tells you that you are at risk for this type of infection. Your sexual activity has changed since you were last screened, and you are at increased risk for chlamydia or gonorrhea. Ask your health care provider if you are at risk. Ask your health care provider about whether you are at high risk for HIV. Your health care provider may recommend a prescription medicine   to help prevent HIV infection. If you choose to take medicine to prevent HIV, you should first get tested for HIV. You should then be tested every 3 months for as long as you are taking the medicine. Pregnancy If you are about to stop having your period (premenopausal) and you may become pregnant, seek counseling before you get  pregnant. Take 400 to 800 micrograms (mcg) of folic acid every day if you become pregnant. Ask for birth control (contraception) if you want to prevent pregnancy. Osteoporosis and menopause Osteoporosis is a disease in which the bones lose minerals and strength with aging. This can result in bone fractures. If you are 65 years old or older, or if you are at risk for osteoporosis and fractures, ask your health care provider if you should: Be screened for bone loss. Take a calcium or vitamin D supplement to lower your risk of fractures. Be given hormone replacement therapy (HRT) to treat symptoms of menopause. Follow these instructions at home: Lifestyle Do not use any products that contain nicotine or tobacco, such as cigarettes, e-cigarettes, and chewing tobacco. If you need help quitting, ask your health care provider. Do not use street drugs. Do not share needles. Ask your health care provider for help if you need support or information about quitting drugs. Alcohol use Do not drink alcohol if: Your health care provider tells you not to drink. You are pregnant, may be pregnant, or are planning to become pregnant. If you drink alcohol: Limit how much you use to 0-1 drink a day. Limit intake if you are breastfeeding. Be aware of how much alcohol is in your drink. In the [place] one drink equals one 12 oz bottle of beer (355 mL), one 5 oz glass of wine (148 mL), or one 1 oz glass of hard liquor (44 mL). General instructions Schedule regular health, dental, and eye exams. Stay current with your vaccines. Tell your health care provider if: You often feel depressed. You have ever been abused or do not feel safe at home. Summary Adopting a healthy lifestyle and getting preventive care are important in promoting health and wellness. Follow your health care provider's instructions about healthy diet, exercising, and getting tested or screened for diseases. Follow your health care provider's  instructions on monitoring your cholesterol and blood pressure. This information is not intended to replace advice given to you by your health care provider. Make sure you discuss any questions you have with your health care provider. Document Revised: 09/07/2020 Document Reviewed: 06/23/2018 Elsevier Patient Education  2022 Elsevier Inc.  

## 2021-04-18 ENCOUNTER — Ambulatory Visit: Payer: Self-pay

## 2021-04-18 NOTE — Telephone Encounter (Signed)
Using [employer] [phone]. Patient called and says her left knee is painful when she stands to walk. She says the pain started on Tuesday night and it's a 10, sharp pain, then the pain level decreases a little. She says she's not able to put pressure over the knee. She says to the outside of the knee there is a little swelling and the color is purple, not red. She says there is no strength to the knee. Denies any other symptoms. Advised no availability with PCP. First available is on Tuesday, she agrees. Appointment scheduled for Tuesday 04/23/21 at 1410 with Maurene Capes, PA, care advice given, patient verbalized understanding.  Summary: Clinical Advice   Patient states her right knee is swollen and she is experiencing knee pain, patient states she is unable to walk.  Patient did state she would like to see a bone specialist,       Reason for Disposition  [1] MODERATE pain (e.g., interferes with normal activities, limping) AND [2] present > 3 days  Answer Assessment - Initial Assessment Questions 1. LOCATION and RADIATION: "Where is the pain located?"      Left knee 2. QUALITY: "What does the pain feel like?"  (e.g., sharp, dull, aching, burning)     Sharp 3. SEVERITY: "How bad is the pain?" "What does it keep you from doing?"   (Scale 1-10; or mild, moderate, severe)   -  MILD (1-3): doesn't interfere with normal activities    -  MODERATE (4-7): interferes with normal activities (e.g., work or school) or awakens from sleep, limping    -  SEVERE (8-10): excruciating pain, unable to do any normal activities, unable to walk     10 4. ONSET: "When did the pain start?" "Does it come and go, or is it there all the time?"     Tuesday at night 5. RECURRENT: "Have you had this pain before?" If Yes, ask: "When, and what happened then?"     Yes about 5 months ago 6. SETTING: "Has there been any recent work, exercise or other activity that involved that part of the body?"      No 7.  AGGRAVATING FACTORS: "What makes the knee pain worse?" (e.g., walking, climbing stairs, running)     Walking, standing up causes the pain 8. ASSOCIATED SYMPTOMS: "Is there any swelling or redness of the knee?"     Yes a little swelling to the left side of the knee, no redness, purple and hot 9. OTHER SYMPTOMS: "Do you have any other symptoms?" (e.g., chest pain, difficulty breathing, fever, calf pain)     No 10. PREGNANCY: "Is there any chance you are pregnant?" "When was your last menstrual period?"       No  Protocols used: Knee Pain-A-AH

## 2021-04-23 ENCOUNTER — Other Ambulatory Visit: Payer: Self-pay

## 2021-04-23 ENCOUNTER — Ambulatory Visit: Payer: Medicare HMO | Attending: Physician Assistant | Admitting: Physician Assistant

## 2021-04-23 ENCOUNTER — Encounter: Payer: Self-pay | Admitting: Physician Assistant

## 2021-04-23 VITALS — BP 118/70 | HR 89 | Temp 98.7°F | Resp 18 | Ht <= 58 in | Wt 177.0 lb

## 2021-04-23 DIAGNOSIS — M25562 Pain in left knee: Secondary | ICD-10-CM

## 2021-04-23 DIAGNOSIS — G8929 Other chronic pain: Secondary | ICD-10-CM

## 2021-04-23 DIAGNOSIS — Z6841 Body Mass Index (BMI) 40.0 and over, adult: Secondary | ICD-10-CM | POA: Diagnosis not present

## 2021-04-23 NOTE — Progress Notes (Signed)
Patient has not taken medication today last taken yesterday, Patient has eaten today. Patient still reports pain in the left knee. Qualifies for Shingles Vaccine? Yes   Zostavax completed No   Shingrix Completed?: No.    Education has been provided regarding the importance of this vaccine. Patient has been advised to call insurance company to determine out of pocket expense if they have not yet received this vaccine. Advised may also receive vaccine at local pharmacy or Health Dept. Verbalized acceptance and understanding. Reiterated to patient.

## 2021-04-23 NOTE — Patient Instructions (Signed)
I encourage you to continue using warm compresses, Tylenol and cane for support. I also encourage you to use icing and bracing.  I have started a referral for you to be seen by orthopedics.  Roney Jaffe, PA-C Physician Assistant [facility] [email or web address]   Dolor agudo de rodilla en los adultos Acute Knee Pain, Adult El dolor agudo de rodilla es repentino y puede deberse a dao, hinchazn o irritacin de los msculos y tejidos que sostienen la rodilla. El dolor puede ser consecuencia de lo siguiente: Cruzita Lederer. Una lesin en la rodilla debido a movimientos de rotacin. Un golpe en la rodilla. Una infeccin. El dolor agudo de Engineer, manufacturing systems solo con el tiempo y el descanso. De no ser as, su mdico podra indicarle que se haga estudios para hallar la causa del dolor. Pueden incluir: Estudios de diagnstico por imgenes, como una radiografa, una resonancia magntica (RM), una exploracin por tomografa computarizada (TC) o una ecografa. Aspiracin de una articulacin. En [employer], se extrae lquido de la rodilla y se evala. Artroscopia. En [employer], se introduce un tubo iluminado en la rodilla y se proyecta una imagen en una pantalla de televisin. Biopsia. En [employer], se toma [place] de tejido del organismo y se la estudia con un microscopio. Siga estas instrucciones en su casa: Si tiene una rodillera o un dispositivo ortopdico:  Use la rodillera o el dispositivo ortopdico como se lo haya indicado el mdico. Quteselos solamente como se lo haya indicado el mdico. Afljelos si siente hormigueo en los dedos del pie, se le adormecen o se le enfran y se tornan azulados. Mantngalos limpios. Si la rodillera o el dispositivo ortopdico no son impermeables: No deje que se mojen. Cbralos con un envoltorio hermtico cuando tome un bao de inmersin o una ducha. Actividad Descanse la rodilla. No  haga cosas que le causen dolor o que lo intensifiquen. Evite las actividades o los ejercicios de alto impacto, como correr, Public relations account executive soga o hacer saltos de tijera. Trabaje con un fisioterapeuta para crear un programa de ejercicios seguros, segn lo recomiende el mdico. Haga ejercicios como se lo haya indicado el fisioterapeuta. Control del dolor, la rigidez y la hinchazn  Si se lo indican, aplique hielo sobre la rodilla afectada. Para hacer esto: Si [place] una rodillera o un dispositivo ortopdico desmontables, quteselos segn las indicaciones del mdico. Ponga el hielo en una bolsa plstica. Coloque una toalla entre la piel y Copy. Aplique el hielo durante 20 minutos, 2 o 3 veces por da. Retire el hielo si la piel se pone de color rojo brillante. Esto es [employer]. Si no puede sentir dolor, calor o fro, tiene un mayor riesgo de que se dae la zona. Si se lo indican, use una venda elstica para ejercer presin (compresin) en la rodilla lesionada. Esto puede controlar la hinchazn, darle sostn y ayudar a Paramedic las [place]. Cuando est sentado o acostado, levante (eleve) la rodilla por encima del nivel del corazn. Duerma con una almohada debajo de la rodilla. Indicaciones generales Use los medicamentos de venta libre y los recetados solamente como se lo haya indicado el mdico. No consuma ningn producto que contenga nicotina o tabaco, como cigarrillos, cigarrillos electrnicos y tabaco de Theatre manager. Si necesita ayuda para dejar de consumir estos productos, consulte al [employer]. Si tiene sobrepeso, trabaje con el mdico y un nutricionista para establecer una meta de prdida de peso que sea saludable y razonable para usted. El exceso de [place]  puede generar presin en la rodilla. Est atento a cualquier cambio en los sntomas. Cumpla con todas las visitas de seguimiento. Esto es importante. Comunquese con un mdico si: El dolor de [place], [place]. Tiene fiebre junto  con dolor de rodilla. La rodilla se siente caliente al tacto o est enrojecida. La rodilla se le tuerce o se le traba. Solicite ayuda de inmediato si: La rodilla se hincha, y la hinchazn empeora. No puede mover la rodilla. Tiene un dolor intenso en la rodilla que no se puede controlar con analgsicos. Resumen El dolor agudo de rodilla puede deberse a una cada, una lesin, una infeccin o a dao, hinchazn o irritacin de los tejidos que sostienen la rodilla. El mdico podra hacerle estudios para hallar la causa del dolor. Est atento a cualquier cambio en los sntomas. [employer] dolor con descanso, medicamentos, actividad de poca intensidad y [place] de hielo. Obtenga ayuda de inmediato si la rodilla se hincha, no puede mover la rodilla o tiene un dolor intenso que no se puede controlar con medicamentos. Esta informacin no tiene Theme park manager el consejo del mdico. Asegrese de hacerle al mdico cualquier pregunta que tenga. Document Revised: 01/31/2020 Document Reviewed: 01/31/2020 Elsevier Patient Education  2022 [employer].

## 2021-04-23 NOTE — Progress Notes (Signed)
Established Patient Office Visit  Subjective:  Patient ID: Emily Hamilton, female    DOB: 1945/12/07  Age: 75 y.o. MRN: [id number]  CC:  Chief Complaint  Patient presents with   Knee Pain     HPI Emily Hamilton presents for left knee pain for the past week, states that she has had left knee pain before but states that this is different.  Denies injury/ trauma  States that it hurts to stand, has been using a cane, warm compresses and tylenol with a little relief. States that she has a little bit of swelling., mostly to the left of her patella   Previous left knee xray 05/2020:  IMPRESSION: Degenerative changes most prominent the lateral compartment with loss of joint space and bony sclerosis.     Electronically Signed   By: Dorise Bullion III M.D   On: 06/02/2020 12:46    Does endorse that she is on Xarelto.   Due to language barrier, an interpreter was present during the history-taking and subsequent discussion (and for part of the physical exam) with this patient.   Past Medical History:  Diagnosis Date   Controlled type 2 diabetes mellitus (HCC)    Family history of diabetes mellitus    Hyperlipidemia    Hypertension    Osteopenia    Status post right knee replacement     Past Surgical History:  Procedure Laterality Date   BREAST EXCISIONAL BIOPSY Left    CHOLECYSTECTOMY     Right knee replacement      Family History  Problem Relation Age of Onset   Diabetes Mother    Glaucoma Mother     Social History   Socioeconomic History   Marital status: Married    Spouse name: Not on file   Number of children: Not on file   Years of education: Not on file   Highest education level: Not on file  Occupational History   Not on file  Tobacco Use   Smoking status: Never   Smokeless tobacco: Never  Vaping Use   Vaping Use: Never used  Substance and Sexual Activity   Alcohol use: Never   Drug use: Never   Sexual activity: Never  Other  Topics Concern   Not on file  Social History Narrative   Not on file   Social Determinants of Health   Financial Resource Strain: Low Risk    Difficulty of Paying Living Expenses: Not hard at all  Food Insecurity: No Food Insecurity   Worried About Charity fundraiser in the Last Year: Never true   [place] in the Last Year: Never true  Transportation Needs: No Transportation Needs   Lack of Transportation (Medical): No   Lack of Transportation (Non-Medical): No  Physical Activity: Insufficiently Active   Days of Exercise per Week: 3 days   Minutes of Exercise per Session: 20 min  Stress: No Stress Concern Present   Feeling of Stress : Not at all  Social Connections: Socially Isolated   Frequency of Communication with Friends and Family: Once a week   Frequency of Social Gatherings with Friends and Family: Once a week   Attends Religious Services: Never   Marine scientist or Organizations: No   Attends Archivist Meetings: Never   Marital Status: Separated  Intimate Partner Violence: Not At Risk   Fear of Current or Ex-Partner: No   Emotionally Abused: No   Physically Abused: No  Sexually Abused: No    Outpatient Medications Prior to Visit  Medication Sig Dispense Refill   amLODipine (NORVASC) 5 MG tablet Take 1 tablet (5 mg total) by mouth daily. 90 tablet 1   atorvastatin (LIPITOR) 40 MG tablet TOME UNA TABLETA TODOS LOS DIAS 90 tablet 1   famotidine (PEPCID) 20 MG tablet Take 1 tablet (20 mg total) by mouth 2 (two) times daily. 6 tablet 0   hydrochlorothiazide (MICROZIDE) 12.5 MG capsule Take 1 capsule (12.5 mg total) by mouth daily. TOME UNA CAPSULA TODOS LOS DIAS 90 capsule 1   isosorbide mononitrate (IMDUR) 30 MG 24 hr tablet Take 0.5 tablets (15 mg total) by mouth daily. 45 tablet 1   metFORMIN (GLUCOPHAGE) 500 MG tablet Take 1 tablet (500 mg total) by mouth 2 (two) times daily with a meal. 180 tablet 1   predniSONE (DELTASONE) 20 MG tablet  Take 1 tablet (20 mg total) by mouth daily with breakfast. 3 tablet 0   rivaroxaban (XARELTO) 20 MG TABS tablet Take 1 tablet (20 mg total) by mouth daily with supper. 90 tablet 1   No facility-administered medications prior to visit.    Allergies  Allergen Reactions   Lisinopril Other (See Comments)    angioedema    ROS Review of Systems  Constitutional: Negative.   HENT: Negative.    Eyes: Negative.   Respiratory:  Negative for shortness of breath.   Cardiovascular:  Negative for chest pain.  Gastrointestinal: Negative.   Endocrine: Negative.   Genitourinary: Negative.   Musculoskeletal:  Positive for arthralgias, gait problem and joint swelling.  Skin: Negative.   Allergic/Immunologic: Negative.   Neurological:  Negative for dizziness, weakness and headaches.  Hematological: Negative.   Psychiatric/Behavioral: Negative.       Objective:    Physical Exam Vitals and nursing note reviewed.  Constitutional:      Appearance: Normal appearance.  HENT:     Head: Normocephalic and atraumatic.     Right Ear: External ear normal.     Left Ear: External ear normal.     Nose: Nose normal.     Mouth/Throat:     Mouth: Mucous membranes are moist.     Pharynx: Oropharynx is clear.  Eyes:     Extraocular Movements: Extraocular movements intact.     Conjunctiva/sclera: Conjunctivae normal.     Pupils: Pupils are equal, round, and reactive to light.  Cardiovascular:     Rate and Rhythm: Normal rate and regular rhythm.     Pulses: Normal pulses.     Heart sounds: Normal heart sounds.  Pulmonary:     Effort: Pulmonary effort is normal.     Breath sounds: Normal breath sounds.  Musculoskeletal:     Cervical back: Normal range of motion and neck supple.     Right knee: Normal.     Left knee: Swelling, bony tenderness and crepitus present. No erythema. Decreased range of motion.     Comments: Slight swelling noted   Skin:    General: Skin is warm and dry.  Neurological:      General: No focal deficit present.     Mental Status: She is alert and oriented to person, place, and time.  Psychiatric:        Mood and Affect: Mood normal.        Behavior: Behavior normal.        Thought Content: Thought content normal.        Judgment: Judgment normal.    BP  118/70 (BP Location: Left Arm, Patient Position: Sitting, Cuff Size: Large)   Pulse 89   Temp 98.7 F (37.1 C) (Oral)   Resp 18   Ht _0  (1.321 m)   Wt 177 lb (80.3 kg)   SpO2 98%   BMI 46.02 kg/m  Wt Readings from Last 3 Encounters:  04/23/21 177 lb (80.3 kg)  03/28/21 174 lb 6.4 oz (79.1 kg)  06/02/20 165 lb (74.8 kg)     Health Maintenance Due  Topic Date Due   Hepatitis C Screening  Never done   Zoster Vaccines- Shingrix (1 of 2) Never done   COVID-19 Vaccine (3 - Pfizer risk series) 02/08/2020   OPHTHALMOLOGY EXAM  11/29/2020   URINE MICROALBUMIN  05/28/2021    There are no preventive care reminders to display for this patient.  No results found for: TSH Lab Results  Component Value Date   WBC 7.4 03/28/2021   HGB 14.2 03/28/2021   HCT 41.4 03/28/2021   MCV 85 03/28/2021   PLT 214 03/28/2021   Lab Results  Component Value Date   NA 141 03/28/2021   K 4.2 03/28/2021   CO2 22 03/28/2021   GLUCOSE 112 (H) 03/28/2021   BUN 15 03/28/2021   CREATININE 0.72 03/28/2021   BILITOT 0.5 03/28/2021   ALKPHOS 116 03/28/2021   AST 54 (H) 03/28/2021   ALT 40 (H) 03/28/2021   PROT 8.0 03/28/2021   ALBUMIN 4.6 03/28/2021   CALCIUM 9.2 03/28/2021   EGFR 88 03/28/2021   Lab Results  Component Value Date   CHOL 155 03/28/2021   Lab Results  Component Value Date   HDL 49 03/28/2021   Lab Results  Component Value Date   LDLCALC 75 03/28/2021   Lab Results  Component Value Date   TRIG 185 (H) 03/28/2021   Lab Results  Component Value Date   CHOLHDL 3.2 03/28/2021   Lab Results  Component Value Date   HGBA1C 6.5 03/28/2021      Assessment & Plan:   Problem List Items  Addressed This Visit       Other   Class 3 severe obesity due to excess calories with serious comorbidity and body mass index (BMI) of 45.0 to 49.9 in adult [facility])   Other Visit Diagnoses     Chronic pain of left knee    -  Primary   Relevant Orders   Ambulatory referral to Orthopedic Surgery       No orders of the defined types were placed in this encounter. 1. Chronic pain of left knee Patient education given on supportive care, refer to orthopedics for further evaluation.  Red flags given for prompt reevaluation - Ambulatory referral to Orthopedic Surgery  2. Class 3 severe obesity due to excess calories with serious comorbidity and body mass index (BMI) of 45.0 to 49.9 in adult [facility])   I have reviewed the patient's medical history (PMH, PSH, Social History, Family History, Medications, and allergies) , and have been updated if relevant. I spent 31 minutes reviewing chart and  face to face time with patient.    Follow-up: Return if symptoms worsen or fail to improve.    Loraine Grip Mayers, PA-C

## 2021-04-24 DIAGNOSIS — G8929 Other chronic pain: Secondary | ICD-10-CM | POA: Insufficient documentation

## 2021-04-24 DIAGNOSIS — M25562 Pain in left knee: Secondary | ICD-10-CM | POA: Insufficient documentation

## 2021-04-25 ENCOUNTER — Other Ambulatory Visit: Payer: Self-pay

## 2021-04-25 ENCOUNTER — Encounter: Payer: Self-pay | Admitting: Orthopaedic Surgery

## 2021-04-25 ENCOUNTER — Ambulatory Visit (INDEPENDENT_AMBULATORY_CARE_PROVIDER_SITE_OTHER): Payer: Medicare HMO | Admitting: Orthopaedic Surgery

## 2021-04-25 ENCOUNTER — Ambulatory Visit: Payer: Self-pay

## 2021-04-25 DIAGNOSIS — M25562 Pain in left knee: Secondary | ICD-10-CM

## 2021-04-25 DIAGNOSIS — Z6841 Body Mass Index (BMI) 40.0 and over, adult: Secondary | ICD-10-CM

## 2021-04-25 MED ORDER — BUPIVACAINE HCL 0.5 % IJ SOLN
2.0000 mL | INTRAMUSCULAR | Status: AC | PRN
  Administered 2021-04-25: 2 mL via INTRA_ARTICULAR

## 2021-04-25 MED ORDER — METHYLPREDNISOLONE ACETATE 40 MG/ML IJ SUSP
40.0000 mg | INTRAMUSCULAR | Status: AC | PRN
Start: 2021-04-25 — End: 2021-04-25
  Administered 2021-04-25: 40 mg via INTRA_ARTICULAR

## 2021-04-25 MED ORDER — LIDOCAINE HCL 1 % IJ SOLN
2.0000 mL | INTRAMUSCULAR | Status: AC | PRN
Start: 2021-04-25 — End: 2021-04-25
  Administered 2021-04-25: 2 mL

## 2021-04-25 NOTE — Progress Notes (Signed)
Office Visit Note   Patient: Emily Hamilton           Date of Birth: [birth year]           MRN: [id number] Visit Date: 04/25/2021              Requested by: Mayers, Kasandra Knudsen, PA-C [address] [place],  [place] [address] PCP: Marcine Matar, MD   Assessment & Plan: Visit Diagnoses:  1. Acute pain of left knee   2. Body mass index 45.0-49.9, adult (HCC)   3. Morbid obesity (HCC)     Plan: Impression is exacerbation of DJD with valgus deformity.  50 cc of blood tinged effusion aspirated and cortisone administered.  Has severe wear of the lateral tibial plateau so I feel that she may have contused the subchondral bone which has caused some blood to be present in the synovial fluid.  She had good relief from the aspiration.  She's been instructed to follow up if she doesn't feel improvement or if the effusion recurs.  Interpreter present today  Follow-Up Instructions: No follow-ups on file.   Orders:  Orders Placed This Encounter  Procedures   Large Joint Inj   XR KNEE 3 VIEW LEFT   No orders of the defined types were placed in this encounter.     Procedures: Large Joint Inj: L knee on 04/25/2021 5:07 PM Details: 22 G needle Medications: 2 mL bupivacaine 0.5 %; 2 mL lidocaine 1 %; 40 mg methylPREDNISolone acetate 40 MG/ML Outcome: tolerated well, no immediate complications Patient was prepped and draped in the usual sterile fashion.      Clinical Data: No additional findings.   Subjective: Chief Complaint  Patient presents with   Left Knee - Pain    Emily Hamilton is a pleasant 75 year old hispanic female here with interpreter for evaluation of acute left knee pain with swelling for about 8 days.  She stood up from a seat and felt immediate pain and a tearing sensation.  She's been using a cane since then for ambulation.  The pain is the worst on the lateral side of the knee.  She has tried using tylenol and heat without significant relief.  She underwent  right total knee replacement 5 years ago.     Review of Systems  Constitutional: Negative.   HENT: Negative.    Eyes: Negative.   Respiratory: Negative.    Cardiovascular: Negative.   Endocrine: Negative.   Musculoskeletal: Negative.   Neurological: Negative.   Hematological: Negative.   Psychiatric/Behavioral: Negative.    All other systems reviewed and are negative.   Objective: Vital Signs: There were no vitals taken for this visit.  Physical Exam Vitals and nursing note reviewed.  Constitutional:      Appearance: She is well-developed.  Pulmonary:     Effort: Pulmonary effort is normal.  Skin:    General: Skin is warm.     Capillary Refill: Capillary refill takes less than 2 seconds.  Neurological:     Mental Status: She is alert and oriented to person, place, and time.  Psychiatric:        Behavior: Behavior normal.        Thought Content: Thought content normal.        Judgment: Judgment normal.    Ortho Exam Left knee shows moderate joint effusion with limitation in ROM.  Pain and crepitus with ROM.  Fixed valgus deformity.  Pain with ROM past 90 degrees.  There is no bony tenderness.    Specialty Comments:  No specialty comments available.  Imaging: XR KNEE 3 VIEW LEFT  Result Date: 04/25/2021 Severe wear of the lateral tibial plateau and DJD and valgus deformity.  Advanced tricompartmental DJD.    PMFS History: Patient Active Problem List   Diagnosis Date Noted   Chronic pain of left knee 04/24/2021   Class 3 severe obesity due to excess calories with serious comorbidity and body mass index (BMI) of 45.0 to 49.9 in adult [facility]) 04/23/2021   Atrial fibrillation (HCC) 10/13/2020   Hyperlipidemia LDL goal <70 10/18/2018   Osteopenia 10/18/2018   Type 2 diabetes mellitus without complication, without long-term current use of insulin (HCC) 09/08/2018   Past Medical History:  Diagnosis Date   Controlled type 2 diabetes mellitus (HCC)    Family history  of diabetes mellitus    Hyperlipidemia    Hypertension    Osteopenia    Status post right knee replacement     Family History  Problem Relation Age of Onset   Diabetes Mother    Glaucoma Mother     Past Surgical History:  Procedure Laterality Date   BREAST EXCISIONAL BIOPSY Left    CHOLECYSTECTOMY     Right knee replacement     Social History   Occupational History   Not on file  Tobacco Use   Smoking status: Never   Smokeless tobacco: Never  Vaping Use   Vaping Use: Never used  Substance and Sexual Activity   Alcohol use: Never   Drug use: Never   Sexual activity: Never

## 2021-05-18 IMAGING — DX DG KNEE 1-2V*L*
2 series · 2 of 2 positions shown · non-contrast
Comparison: None.

CLINICAL DATA: Left knee pain since yesterday.  No known injury.

EXAM:
LEFT KNEE - 1-2 VIEW

[knee ap]
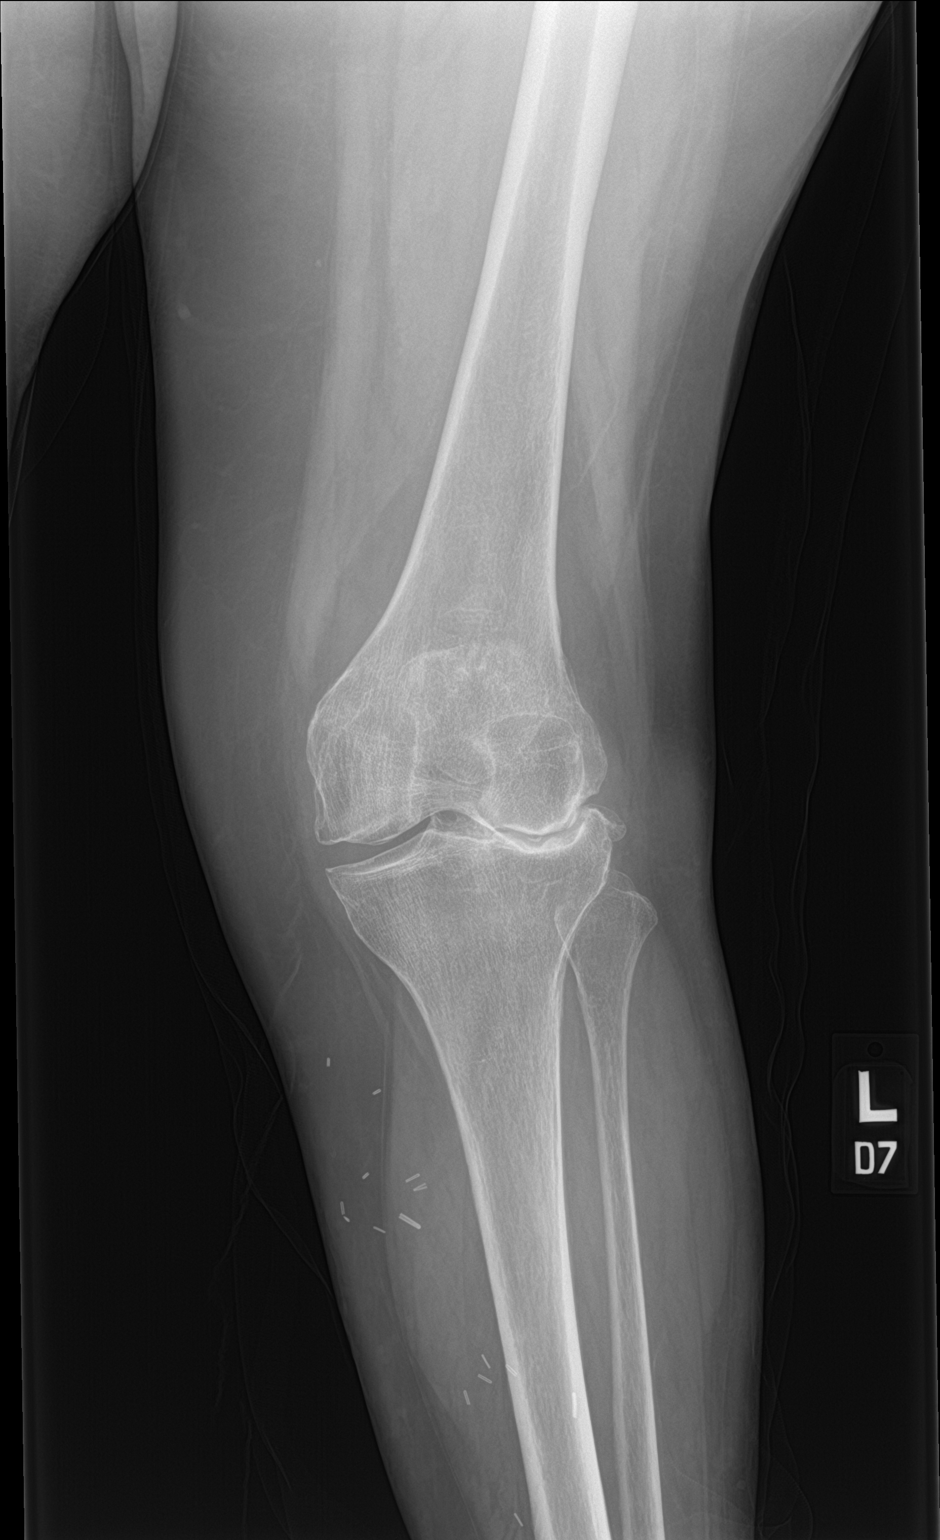

[knee lat]
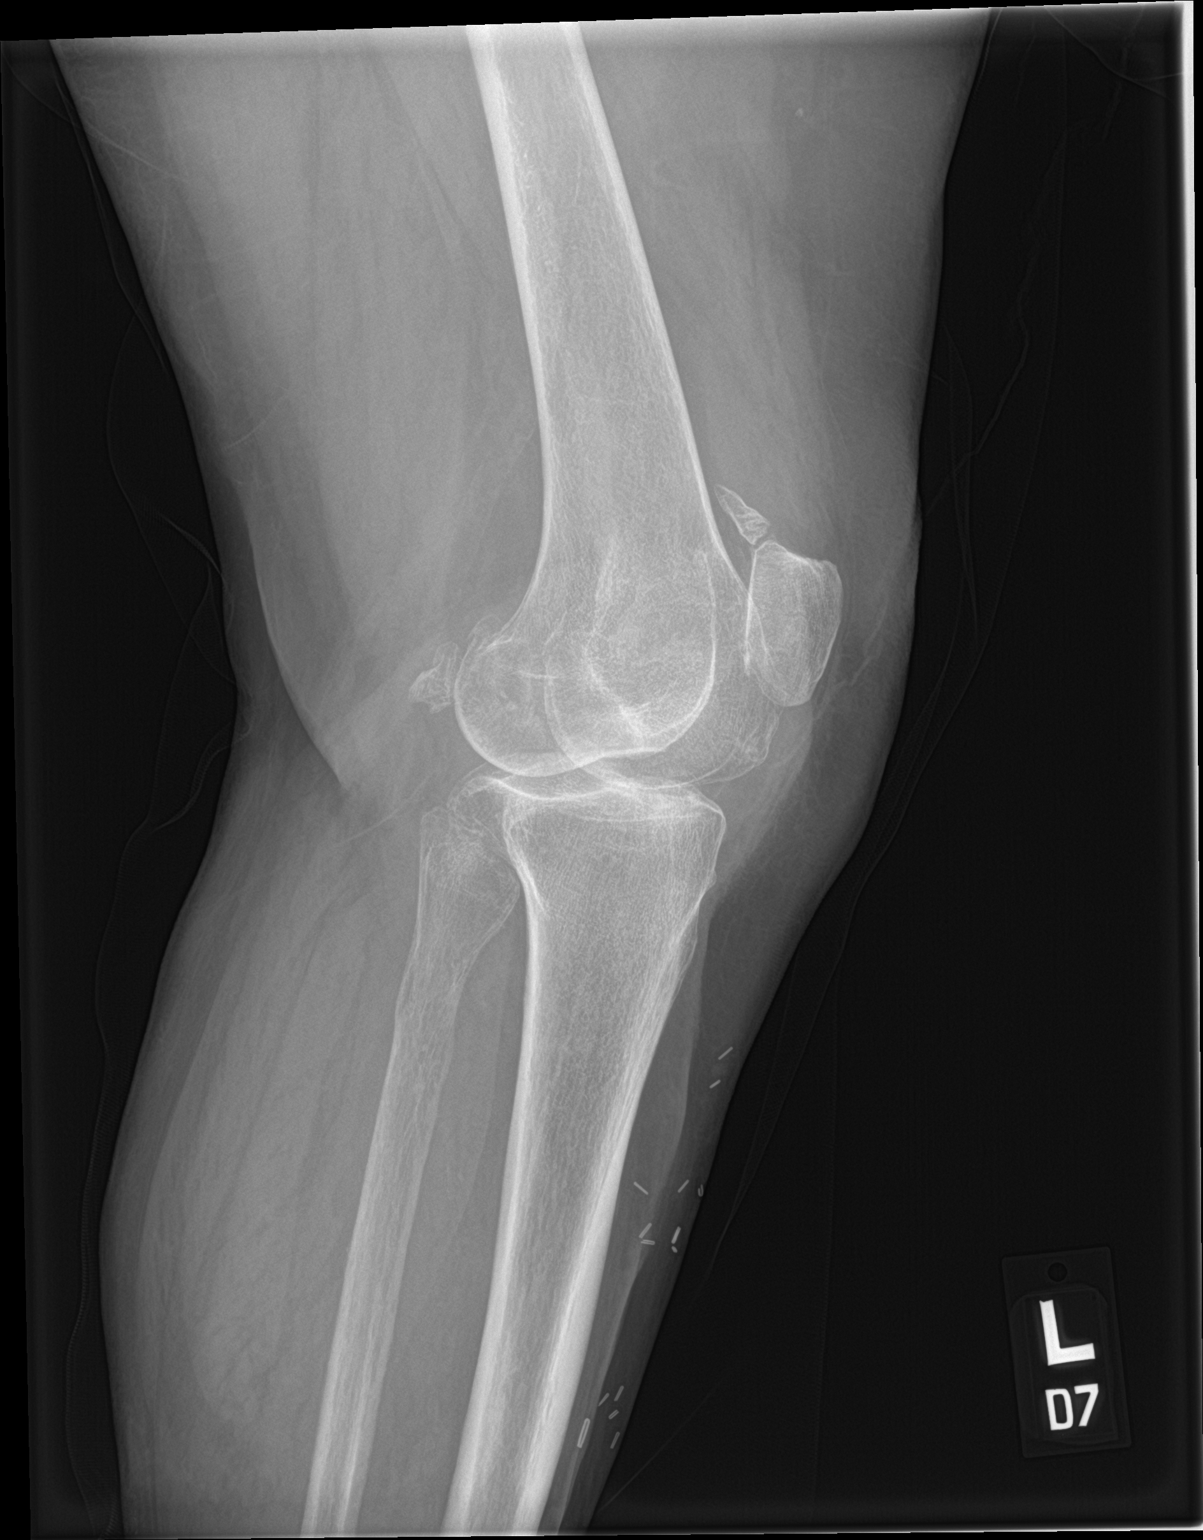

[2 of 2 positions shown; findings below may reference images not displayed]

FINDINGS: No fractures or effusions identified. Degenerative changes most
prominent the lateral compartment with bony sclerosis and loss of
joint space.
IMPRESSION: Degenerative changes most prominent the lateral compartment with
loss of joint space and bony sclerosis.

## 2021-05-24 ENCOUNTER — Ambulatory Visit (INDEPENDENT_AMBULATORY_CARE_PROVIDER_SITE_OTHER): Payer: Medicare HMO | Admitting: Orthopaedic Surgery

## 2021-05-24 ENCOUNTER — Ambulatory Visit: Payer: Self-pay

## 2021-05-24 ENCOUNTER — Other Ambulatory Visit: Payer: Self-pay

## 2021-05-24 ENCOUNTER — Ambulatory Visit (INDEPENDENT_AMBULATORY_CARE_PROVIDER_SITE_OTHER): Payer: Medicare HMO

## 2021-05-24 ENCOUNTER — Encounter: Payer: Self-pay | Admitting: Orthopaedic Surgery

## 2021-05-24 VITALS — Ht <= 58 in | Wt 174.6 lb

## 2021-05-24 DIAGNOSIS — Z6841 Body Mass Index (BMI) 40.0 and over, adult: Secondary | ICD-10-CM

## 2021-05-24 DIAGNOSIS — M170 Bilateral primary osteoarthritis of knee: Secondary | ICD-10-CM | POA: Diagnosis not present

## 2021-05-24 DIAGNOSIS — M1712 Unilateral primary osteoarthritis, left knee: Secondary | ICD-10-CM

## 2021-05-24 NOTE — Progress Notes (Signed)
Office Visit Note   Patient: Emily Hamilton           Date of Birth: Nov 19, 1945           MRN: [id number] Visit Date: 05/24/2021              Requested by: Marcine Matar, MD [address] [place],  [place] [address] PCP: Marcine Matar, MD   Assessment & Plan: Visit Diagnoses:  1. Primary osteoarthritis of left knee   2. Morbid obesity (HCC)   3. Body mass index 40.0-44.9, adult (HCC)     Plan: Impression is referred pain from severe lateral compartment DJD.  We obtained x-rays today to reassure her that there are no fractures.  She understands that she is likely facing a knee replacement in the near future.  She will make all attempts at weight loss.  Interpreter present today.  Follow-Up Instructions: No follow-ups on file.   Orders:  Orders Placed This Encounter  Procedures   XR Tibia/Fibula Left   XR Knee 1-2 Views Left   No orders of the defined types were placed in this encounter.     Procedures: No procedures performed   Clinical Data: No additional findings.   Subjective: Chief Complaint  Patient presents with   Left Knee - Pain    HPI  Patient comes in today for chronic left knee pain.  We did a cortisone injection on 04/25/2021 which helped some.  She feels pain radiating down into the anterior lateral aspect of the shin.  She feels like something is subjectively broken.  She also feels a shifting sensation.  Review of Systems   Objective: Vital Signs: Ht 4\' 7"  (1.397 m)   Wt 174 lb 9.6 oz (79.2 kg)   BMI 40.58 kg/m   Physical Exam  Ortho Exam  Left knee exam is unchanged with valgus deformity.  She has no bony tenderness of the tibia.  She feels vague deep discomfort in the anterolateral calf.  There is no calf tenderness posteriorly.  Specialty Comments:  No specialty comments available.  Imaging: XR Knee 1-2 Views Left  Result Date: 05/24/2021 Advanced DJD of the lateral compartment with severe wear of the  lateral tibial plateau.  XR Tibia/Fibula Left  Result Date: 05/24/2021 No evidence of fractures.  Osteopenia.    PMFS History: Patient Active Problem List   Diagnosis Date Noted   Chronic pain of left knee 04/24/2021   Class 3 severe obesity due to excess calories with serious comorbidity and body mass index (BMI) of 45.0 to 49.9 in adult [facility]) 04/23/2021   Atrial fibrillation (HCC) 10/13/2020   Hyperlipidemia LDL goal <70 10/18/2018   Osteopenia 10/18/2018   Type 2 diabetes mellitus without complication, without long-term current use of insulin (HCC) 09/08/2018   Past Medical History:  Diagnosis Date   Controlled type 2 diabetes mellitus (HCC)    Family history of diabetes mellitus    Hyperlipidemia    Hypertension    Osteopenia    Status post right knee replacement     Family History  Problem Relation Age of Onset   Diabetes Mother    Glaucoma Mother     Past Surgical History:  Procedure Laterality Date   BREAST EXCISIONAL BIOPSY Left    CHOLECYSTECTOMY     Right knee replacement     Social History   Occupational History   Not on file  Tobacco Use   Smoking status: Never   Smokeless  tobacco: Never  Vaping Use   Vaping Use: Never used  Substance and Sexual Activity   Alcohol use: Never   Drug use: Never   Sexual activity: Never

## 2021-06-14 ENCOUNTER — Other Ambulatory Visit: Payer: Self-pay | Admitting: Internal Medicine

## 2021-06-14 DIAGNOSIS — I10 Essential (primary) hypertension: Secondary | ICD-10-CM

## 2021-06-25 ENCOUNTER — Emergency Department (HOSPITAL_COMMUNITY): Payer: Medicare HMO

## 2021-06-25 ENCOUNTER — Emergency Department (HOSPITAL_COMMUNITY)
Admission: EM | Admit: 2021-06-25 | Discharge: 2021-06-25 | Disposition: A | Discharge status: Home / Self Care | Payer: Medicare HMO | Attending: Emergency Medicine | Admitting: Emergency Medicine

## 2021-06-25 ENCOUNTER — Other Ambulatory Visit: Payer: Self-pay

## 2021-06-25 ENCOUNTER — Encounter (HOSPITAL_COMMUNITY): Payer: Self-pay | Admitting: Emergency Medicine

## 2021-06-25 DIAGNOSIS — I10 Essential (primary) hypertension: Secondary | ICD-10-CM | POA: Diagnosis not present

## 2021-06-25 DIAGNOSIS — E119 Type 2 diabetes mellitus without complications: Secondary | ICD-10-CM | POA: Insufficient documentation

## 2021-06-25 DIAGNOSIS — M79602 Pain in left arm: Secondary | ICD-10-CM | POA: Insufficient documentation

## 2021-06-25 DIAGNOSIS — Z7901 Long term (current) use of anticoagulants: Secondary | ICD-10-CM | POA: Diagnosis not present

## 2021-06-25 DIAGNOSIS — R5383 Other fatigue: Secondary | ICD-10-CM | POA: Diagnosis not present

## 2021-06-25 DIAGNOSIS — Z79899 Other long term (current) drug therapy: Secondary | ICD-10-CM | POA: Diagnosis not present

## 2021-06-25 DIAGNOSIS — Z96651 Presence of right artificial knee joint: Secondary | ICD-10-CM | POA: Insufficient documentation

## 2021-06-25 LAB — CBC WITH DIFFERENTIAL/PLATELET
Abs Immature Granulocytes: 0.04 10*3/uL (ref 0.00–0.07)
Basophils Absolute: 0 10*3/uL (ref 0.0–0.1)
Basophils Relative: 0 %
Eosinophils Absolute: 0.2 10*3/uL (ref 0.0–0.5)
Eosinophils Relative: 3 %
HCT: 40.2 % (ref 36.0–46.0)
Hemoglobin: 12.4 g/dL (ref 12.0–15.0)
Immature Granulocytes: 1 %
Lymphocytes Relative: 23 %
Lymphs Abs: 1.6 10*3/uL (ref 0.7–4.0)
MCH: 29 pg (ref 26.0–34.0)
MCHC: 30.8 g/dL (ref 30.0–36.0)
MCV: 93.9 fL (ref 80.0–100.0)
Monocytes Absolute: 0.3 10*3/uL (ref 0.1–1.0)
Monocytes Relative: 5 %
Neutro Abs: 4.7 10*3/uL (ref 1.7–7.7)
Neutrophils Relative %: 68 %
Platelets: 189 10*3/uL (ref 150–400)
RBC: 4.28 MIL/uL (ref 3.87–5.11)
RDW: 14.5 % (ref 11.5–15.5)
WBC: 6.9 10*3/uL (ref 4.0–10.5)
nRBC: 0 % (ref 0.0–0.2)

## 2021-06-25 LAB — COMPREHENSIVE METABOLIC PANEL
ALT: 22 U/L (ref 0–44)
AST: 36 U/L (ref 15–41)
Albumin: 3.7 g/dL (ref 3.5–5.0)
Alkaline Phosphatase: 94 U/L (ref 38–126)
Anion gap: 5 (ref 5–15)
BUN: 8 mg/dL (ref 8–23)
CO2: 26 mmol/L (ref 22–32)
Calcium: 8.7 mg/dL — ABNORMAL LOW (ref 8.9–10.3)
Chloride: 105 mmol/L (ref 98–111)
Creatinine, Ser: 0.6 mg/dL (ref 0.44–1.00)
GFR, Estimated: >60 mL/min (ref >60)
Glucose, Bld: 148 mg/dL — ABNORMAL HIGH (ref 70–99)
Potassium: 4 mmol/L (ref 3.5–5.1)
Sodium: 136 mmol/L (ref 135–145)
Total Bilirubin: 0.8 mg/dL (ref 0.3–1.2)
Total Protein: 7.5 g/dL (ref 6.5–8.1)

## 2021-06-25 MED ORDER — PREDNISONE 20 MG PO TABS: 40.0000 mg | ORAL_TABLET | Freq: Every day | ORAL | 0 refills | Status: AC

## 2021-06-25 MED ORDER — METHOCARBAMOL 500 MG PO TABS: 500.0000 mg | ORAL_TABLET | Freq: Every evening | ORAL | 0 refills | Status: DC | PRN

## 2021-06-25 MED ORDER — HYDROCODONE-ACETAMINOPHEN 5-325 MG PO TABS
1.0000 | ORAL_TABLET | Freq: Once | ORAL | Status: AC
  Administered 2021-06-25: 1 via ORAL
  Filled 2021-06-25: qty 1

## 2021-06-25 NOTE — ED Provider Notes (Signed)
[facility] EMERGENCY DEPARTMENT Provider Note   CSN: [id number] Arrival date & time: 06/25/21  1225     History Chief Complaint  Patient presents with   Arm Pain    Emily Hamilton is a 75 y.o. female presenting for evaluation of left arm pain.  Patient states she has had left arm pain for 5 to 6 months.  Over the past 8 days, her pain has become much more severe.  Pain is worse with movement, nothing makes it better.  It does not radiate anywhere.  There is no fall, trauma, or injury either initially or recently.  She has never had pain like this before.  No numbness or tingling in her hand.  No symptoms on the right side.  Pain is mostly in her elbow, but she feels it in her shoulder and upper back as well.  She has been taking ibuprofen without improvement of symptoms.  She has not been evaluated by anyone for this pain since it began. Additionally, at the end of the visit, patient reported that for the past 3 months she has felt tired, especially when she walks.  She does not have any chest pain or shortness of breath.  No fevers, nausea, vomiting, urinary symptoms.  HPI     Past Medical History:  Diagnosis Date   Controlled type 2 diabetes mellitus ([place])    Family history of diabetes mellitus    Hyperlipidemia    Hypertension    Osteopenia    Status post right knee replacement     Patient Active Problem List   Diagnosis Date Noted   Chronic pain of left knee 04/24/2021   Class 3 severe obesity due to excess calories with serious comorbidity and body mass index (BMI) of 45.0 to 49.9 in adult [facility]) 04/23/2021   Atrial fibrillation ([place]) 10/13/2020   Hyperlipidemia LDL goal <70 10/18/2018   Osteopenia 10/18/2018   Type 2 diabetes mellitus without complication, without long-term current use of insulin ([place]) 09/08/2018    Past Surgical History:  Procedure Laterality Date   BREAST EXCISIONAL BIOPSY Left    CHOLECYSTECTOMY     Right knee  replacement       OB History   No obstetric history on file.     Family History  Problem Relation Age of Onset   Diabetes Mother    Glaucoma Mother     Social History   Tobacco Use   Smoking status: Never   Smokeless tobacco: Never  Vaping Use   Vaping Use: Never used  Substance Use Topics   Alcohol use: Never   Drug use: Never    Home Medications Prior to Admission medications   Medication Sig Start Date End Date Taking? Authorizing Provider  methocarbamol (ROBAXIN) 500 MG tablet Take 1 tablet (500 mg total) by mouth at bedtime as needed for muscle spasms. 06/25/21  Yes Tyronn Golda, PA-C  predniSONE (DELTASONE) 20 MG tablet Take 2 tablets (40 mg total) by mouth daily for 5 days. 06/25/21 06/30/21 Yes Kacper Cartlidge, PA-C  amLODipine (NORVASC) 5 MG tablet Take 1 tablet (5 mg total) by mouth daily. 03/28/21   Ladell Pier, MD  atorvastatin (LIPITOR) 40 MG tablet TOME UNA TABLETA TODOS LOS DIAS 05/28/20   Fulp, Cammie, MD  famotidine (PEPCID) 20 MG tablet Take 1 tablet (20 mg total) by mouth 2 (two) times daily. 03/28/21   Ladell Pier, MD  hydrochlorothiazide (MICROZIDE) 12.5 MG capsule Take 1 capsule (12.5 mg total)  by mouth daily. TOME UNA CAPSULA TODOS LOS DIAS 03/28/21   Ladell Pier, MD  isosorbide mononitrate (IMDUR) 30 MG 24 hr tablet Take 0.5 tablets (15 mg total) by mouth daily. 03/28/21   Ladell Pier, MD  metFORMIN (GLUCOPHAGE) 500 MG tablet Take 1 tablet (500 mg total) by mouth 2 (two) times daily with a meal. 03/28/21   Ladell Pier, MD  rivaroxaban (XARELTO) 20 MG TABS tablet Take 1 tablet (20 mg total) by mouth daily with supper. 03/28/21   Ladell Pier, MD    Allergies    Lisinopril  Review of Systems   Review of Systems  Musculoskeletal:  Positive for arthralgias.  All other systems reviewed and are negative.  Physical Exam Updated Vital Signs BP 122/81 (BP Location: Left Arm)    Pulse 73    Temp 98.5 F (36.9  C) (Oral)    Resp 16    SpO2 98%   Physical Exam Vitals and nursing note reviewed.  Constitutional:      General: She is not in acute distress.    Appearance: Normal appearance.     Comments: Sitting in the chair no acute distress  HENT:     Head: Normocephalic and atraumatic.  Eyes:     Extraocular Movements: Extraocular movements intact.     Conjunctiva/sclera: Conjunctivae normal.     Pupils: Pupils are equal, round, and reactive to light.  Cardiovascular:     Rate and Rhythm: Normal rate. Rhythm irregular.     Pulses: Normal pulses.  Pulmonary:     Effort: Pulmonary effort is normal. No respiratory distress.     Breath sounds: Normal breath sounds. No wheezing.     Comments: Speaking in full sentences.  Clear lung sounds in all fields. No shortness of breath or apparent distress with ambulation Abdominal:     General: There is no distension.     Palpations: Abdomen is soft. There is no mass.     Tenderness: There is no abdominal tenderness. There is no guarding or rebound.  Musculoskeletal:     Cervical back: Normal range of motion and neck supple.     Comments: Deformity of the left elbow, baseline per patient due to an injury as a child.  Increased pain with movement of the left elbow.  Mild increased pain with movement of the wrist and fingers of the left side.  Radial pulse 2+ bilaterally.  Grip strength equal bilaterally.  Patient able to abduct and adduct at the shoulder without difficulty.  Mild tenderness palpation of the trapezius, but no pain over the bony shoulder or bony elbow.  No erythema or warmth. No TTP of midline spine.  No step-offs or deformities.  I watched patient ambulate without difficulty.  Skin:    General: Skin is warm and dry.     Capillary Refill: Capillary refill takes less than 2 seconds.  Neurological:     Mental Status: She is alert and oriented to person, place, and time.  Psychiatric:        Mood and Affect: Mood and affect normal.         Speech: Speech normal.        Behavior: Behavior normal.    ED Results / Procedures / Treatments   Labs (all labs ordered are listed, but only abnormal results are displayed) Labs Reviewed  COMPREHENSIVE METABOLIC PANEL - Abnormal; Notable for the following components:      Result Value   Glucose, Bld 148 (*)  Calcium 8.7 (*)    All other components within normal limits  CBC WITH DIFFERENTIAL/PLATELET    EKG EKG Interpretation  Date/Time:  Tuesday June 25 2021 12:38:43 EST Ventricular Rate:  85 PR Interval:    QRS Duration: 70 QT Interval:  386 QTC Calculation: 459 R Axis:   39 Text Interpretation: Atrial fibrillation Nonspecific T wave abnormality Abnormal ECG No old tracing to compare Confirmed by Susy Frizzle [phone]) on 06/25/2021 4:44:14 PM  Radiology DG Elbow Complete Left  Result Date: 06/25/2021 CLINICAL DATA:  A 75 year old female presents with LEFT arm pain for 5 months, worse over the past 8 days. EXAM: LEFT ELBOW - COMPLETE 3+ VIEW COMPARISON:  Humeral evaluation of the same date. FINDINGS: Degenerative changes about the elbow. No visible fracture. Lateral projection with elevation of the posterior fat pad suggests select suggests compatible with small joint effusion. IMPRESSION: Degenerative changes of the left elbow with probable small joint effusion. No visible fracture. Correlate with any signs of infection. Electronically Signed   By: Donzetta Kohut M.D.   On: 06/25/2021 14:01   DG Humerus Left  Result Date: 06/25/2021 CLINICAL DATA:  LEFT arm pain EXAM: LEFT HUMERUS - 2+ VIEW COMPARISON:  None. FINDINGS: No fracture of the humeral shaft. The elbow joint and glenohumeral joint appear normal on two views. Osteophytes at the elbow joint. IMPRESSION: No fracture or dislocation Electronically Signed   By: Genevive Bi M.D.   On: 06/25/2021 13:58    Procedures Procedures   Medications Ordered in ED Medications  HYDROcodone-acetaminophen  (NORCO/VICODIN) 5-325 MG per tablet 1 tablet (1 tablet Oral Given 06/25/21 1811)    ED Course  I have reviewed the triage vital signs and the nursing notes.  Pertinent labs & imaging results that were available during my care of the patient were reviewed by me and considered in my medical decision making (see chart for details).    MDM Rules/Calculators/A&P                           Patient presenting for evaluation of left arm pain.  Pain is been present for several months, worsened in the last week.  On exam, patient appears nontoxic.  She has an old deformity of the left elbow.  X-rays obtained from triage viewed and independently interpreted by me, shows degenerative disease, but no acute fracture or dislocation.  Per radiology, there is a mild effusion.  Exam is not consistent with infection, there is no erythema, warmth, no difficulty ranging.  Pain extends from the trapezius muscle to the distal forearm, likely muscular in nature.  Will treat with muscle relaxers and steroids.  Patient is on blood thinner, unable to take NSAIDs. Additionally, towards the end of the visit she reports feeling tired.  This is been going on for several months.  Will obtain basic labs to ensure no anemia or electrolyte abnormality.  EKG ordered. No sxs of infection. Considering length of time of sxs and stable vital signs, doubt infection.   EKG shows A. fib, patient has a history of this, no signs of ischemia.  Labs interpreted by me, overall reassuring.  No anemia.  No electrolyte abnormality.  Discussed findings with patient.  Discussed importance of follow-up with PCP for further evaluation of her fatigue.  Encouraged her to follow-up with orthopedics for evaluation of her left arm pain.  At this time, patient safe for discharge.  Return precautions given.  Patient states she understands  and agrees to plan.   Final Clinical Impression(s) / ED Diagnoses Final diagnoses:  Left arm pain  Fatigue, unspecified  type    Rx / DC Orders ED Discharge Orders          Ordered    predniSONE (DELTASONE) 20 MG tablet  Daily        06/25/21 1858    methocarbamol (ROBAXIN) 500 MG tablet  At bedtime PRN        06/25/21 1858             Franchot Heidelberg, PA-C 06/25/21 1915    Truddie Hidden, MD 06/25/21 2153

## 2021-06-25 NOTE — ED Triage Notes (Signed)
Pt reports intermittent L arm pain x 5 months.  Pain worse over the last 8 days.  Denies injury.  Denies increase in pain with movement.

## 2021-06-25 NOTE — ED Notes (Signed)
DC instructions reviewed with pt. Pt verbalized understanding.  Pt DC 

## 2021-06-25 NOTE — Discharge Instructions (Addendum)
Toma prednisone por inflamacion y Engineer, mining. Puede casuar el numero de azucar estar alto Toma robaxin para relajar los musculos. Ten cuidado, puede causar cansado Da Neomia Dear cita con Dr. Roda Shutters por mas evaluacion del codo/brazo  Da Neomia Dear cita con Dr. Laural Benes por las evaluacion del cansado  Regresa a la sala de emergencia si tiene sintomas nuevos.

## 2021-06-25 NOTE — ED Provider Notes (Signed)
Emergency Medicine Provider Triage Evaluation Note  Emily Hamilton , a 75 y.o. female  was evaluated in triage.  Pt complains of left arm pain x39-month duration that worsened 9 days ago.  She denies chest pain, shortness of breath, lightheadedness, or palpitations.  She has not been seen by her PCP and has an appointment scheduled for 1/5.Marland Kitchen  Review of Systems  Positive: Left arm pain Negative: Chest pain, lightheadedness, palpitations, shortness of breath  Physical Exam  BP (!) 143/74 (BP Location: Left Arm)    Pulse 80    Temp 98.3 F (36.8 C) (Oral)    Resp 14    SpO2 97%  Gen:   Awake, no distress   Resp:  Normal effort  MSK:   Moves extremities without difficulty  Other:  Range of motion in left upper extremity intact.  Tenderness to palpation present over left humerus.  Full range of motion left hand, left wrist.  Medical Decision Making  Medically screening exam initiated at 1:15 PM.  Appropriate orders placed.  Emily Hamilton was informed that the remainder of the evaluation will be completed by another provider, this initial triage assessment does not replace that evaluation, and the importance of remaining in the ED until their evaluation is complete.     Emily [place], PA-C 06/25/21 1317    Emily Bale, MD 06/25/21 1536

## 2021-06-28 DIAGNOSIS — H2513 Age-related nuclear cataract, bilateral: Secondary | ICD-10-CM | POA: Diagnosis not present

## 2021-06-28 DIAGNOSIS — H40013 Open angle with borderline findings, low risk, bilateral: Secondary | ICD-10-CM | POA: Diagnosis not present

## 2021-06-28 DIAGNOSIS — H31012 Macula scars of posterior pole (postinflammatory) (post-traumatic), left eye: Secondary | ICD-10-CM | POA: Diagnosis not present

## 2021-06-28 DIAGNOSIS — E119 Type 2 diabetes mellitus without complications: Secondary | ICD-10-CM | POA: Diagnosis not present

## 2021-06-28 DIAGNOSIS — H11043 Peripheral pterygium, stationary, bilateral: Secondary | ICD-10-CM | POA: Diagnosis not present

## 2021-06-28 LAB — HM DIABETES EYE EXAM

## 2021-06-30 ENCOUNTER — Other Ambulatory Visit: Payer: Self-pay | Admitting: Internal Medicine

## 2021-06-30 DIAGNOSIS — I10 Essential (primary) hypertension: Secondary | ICD-10-CM

## 2021-07-01 NOTE — Telephone Encounter (Signed)
Requested medication (s) are due for refill today: Yes  Requested medication (s) are on the active medication list: NO  Last refill:  03/14/21   Future visit scheduled: yes  Notes to clinic:  med was dc'd on 03/28/21, please advise     Requested Prescriptions  Pending Prescriptions Disp Refills   lisinopril (ZESTRIL) 5 MG tablet [Pharmacy Med Name: LISINOPRIL 5 MG TABLET] 90 tablet 0    Sig: Take 1 tablet (5 mg total) by mouth daily.     Cardiovascular:  ACE Inhibitors Passed - 06/30/2021  9:36 AM      Passed - Cr in normal range and within 180 days    Creatinine, Ser  Date Value Ref Range Status  06/25/2021 0.60 0.44 - 1.00 mg/dL Final          Passed - K in normal range and within 180 days    Potassium  Date Value Ref Range Status  06/25/2021 4.0 3.5 - 5.1 mmol/L Final          Passed - Patient is not pregnant      Passed - Last BP in normal range    BP Readings from Last 1 Encounters:  06/25/21 122/81          Passed - Valid encounter within last 6 months    Recent Outpatient Visits           2 months ago Chronic pain of left knee   [facility] Mayers, Cari S, PA-C   3 months ago Type 2 diabetes mellitus with obesity (HCC)   [facility] Marcine Matar, MD   8 months ago Essential hypertension   [facility] [place], Jeannett Senior L, RPH-CPP   8 months ago Type 2 diabetes mellitus with other circulatory complications [facility])   [facility] Marcine Matar, MD   1 year ago Well female exam without gynecological exam    [facility] Cain Saupe, MD       Future Appointments             In 4 days Tarry Kos, MD [facility]   In 2 weeks [place], [place] Organ [facility]

## 2021-07-05 ENCOUNTER — Other Ambulatory Visit: Payer: Self-pay

## 2021-07-05 ENCOUNTER — Encounter: Payer: Self-pay | Admitting: Orthopaedic Surgery

## 2021-07-05 ENCOUNTER — Ambulatory Visit (INDEPENDENT_AMBULATORY_CARE_PROVIDER_SITE_OTHER): Payer: Medicare HMO | Admitting: Orthopaedic Surgery

## 2021-07-05 DIAGNOSIS — M19122 Post-traumatic osteoarthritis, left elbow: Secondary | ICD-10-CM | POA: Diagnosis not present

## 2021-07-05 MED ORDER — LIDOCAINE HCL 1 % IJ SOLN
1.0000 mL | INTRAMUSCULAR | Status: AC | PRN
  Administered 2021-07-05: 17:00:00 1 mL

## 2021-07-05 MED ORDER — METHYLPREDNISOLONE ACETATE 40 MG/ML IJ SUSP
40.0000 mg | INTRAMUSCULAR | Status: AC | PRN
  Administered 2021-07-05: 17:00:00 40 mg via INTRA_ARTICULAR

## 2021-07-05 MED ORDER — BUPIVACAINE HCL 0.5 % IJ SOLN
1.0000 mL | INTRAMUSCULAR | Status: AC | PRN
Start: 2021-07-05 — End: 2021-07-05
  Administered 2021-07-05: 17:00:00 1 mL via INTRA_ARTICULAR

## 2021-07-05 NOTE — Progress Notes (Signed)
Office Visit Note   Patient: Emily Hamilton           Date of Birth: 01/02/1946           MRN: [id number] Visit Date: 07/05/2021              Requested by: Marcine Matar, MD [address] [place],  [place] [address] PCP: Marcine Matar, MD   Assessment & Plan: Visit Diagnoses:  1. Post-traumatic osteoarthritis of left elbow     Plan: Impression is posttraumatic arthritis of the left elbow with cubitus varus deformity.  Based on treatment options she elected for left elbow injection which she tolerated well.  We will see her back as needed.  Interpreter present today.  Follow-Up Instructions: No follow-ups on file.   Orders:  No orders of the defined types were placed in this encounter.  No orders of the defined types were placed in this encounter.     Procedures: Medium Joint Inj: L elbow on 07/05/2021 4:55 PM Indications: pain Details: 25 G needle Medications: 1 mL lidocaine 1 %; 1 mL bupivacaine 0.5 %; 40 mg methylPREDNISolone acetate 40 MG/ML Outcome: tolerated well, no immediate complications Patient was prepped and draped in the usual sterile fashion.      Clinical Data: No additional findings.   Subjective: Chief Complaint  Patient presents with   Left Arm - Pain    Emily Hamilton returns today for evaluation of a separate problem.  She has had 2 weeks of left elbow pain.  She states that her arm feels heavy and there is pain with movement of the elbow.  She states that she had an injury as a child that caused a dislocation of her elbow.  This was treated nonsurgically.   Review of Systems  Constitutional: Negative.   HENT: Negative.    Eyes: Negative.   Respiratory: Negative.    Cardiovascular: Negative.   Endocrine: Negative.   Musculoskeletal: Negative.   Neurological: Negative.   Hematological: Negative.   Psychiatric/Behavioral: Negative.    All other systems reviewed and are negative.   Objective: Vital Signs: There were no  vitals taken for this visit.  Physical Exam Vitals and nursing note reviewed.  Constitutional:      Appearance: She is well-developed.  Pulmonary:     Effort: Pulmonary effort is normal.  Skin:    General: Skin is warm.     Capillary Refill: Capillary refill takes less than 2 seconds.  Neurological:     Mental Status: She is alert and oriented to person, place, and time.  Psychiatric:        Behavior: Behavior normal.        Thought Content: Thought content normal.        Judgment: Judgment normal.    Ortho Exam  Left elbow shows cubitus varus deformity.  There is crepitus with passive range of motion.  No neurovascular compromise distally.  Specialty Comments:  No specialty comments available.  Imaging: No results found.   PMFS History: Patient Active Problem List   Diagnosis Date Noted   Chronic pain of left knee 04/24/2021   Class 3 severe obesity due to excess calories with serious comorbidity and body mass index (BMI) of 45.0 to 49.9 in adult [facility]) 04/23/2021   Atrial fibrillation (HCC) 10/13/2020   Hyperlipidemia LDL goal <70 10/18/2018   Osteopenia 10/18/2018   Type 2 diabetes mellitus without complication, without long-term current use of insulin (HCC) 09/08/2018   Past  Medical History:  Diagnosis Date   Controlled type 2 diabetes mellitus (HCC)    Family history of diabetes mellitus    Hyperlipidemia    Hypertension    Osteopenia    Status post right knee replacement     Family History  Problem Relation Age of Onset   Diabetes Mother    Glaucoma Mother     Past Surgical History:  Procedure Laterality Date   BREAST EXCISIONAL BIOPSY Left    CHOLECYSTECTOMY     Right knee replacement     Social History   Occupational History   Not on file  Tobacco Use   Smoking status: Never   Smokeless tobacco: Never  Vaping Use   Vaping Use: Never used  Substance and Sexual Activity   Alcohol use: Never   Drug use: Never   Sexual activity: Never

## 2021-07-17 NOTE — Progress Notes (Signed)
Patient ID: Emily Hamilton, female   DOB: August 25, 1945, 76 y.o.   MRN: [id number]    Emily Hamilton, is a 76 y.o. female  [id number]  [id number]  DOB - [birth year]  Chief Complaint  Patient presents with   Follow-up       Subjective:   Emily Hamilton is a 76 y.o. female here today for a follow up visit After ED visit 12/13 for L arm pain and fatigue.  This has resolved.  She is c/o bloating and continued dyspepsia s/p h. Pylori treatment.  She says she never felt any improvement with treatment. No fever.  No melena/hematochezia.  Appetite is good.    She also has a lesion on her buttocks that has been there a long time but has started stinging and burning and bothering her.    From A/P: Patient presenting for evaluation of left arm pain.  Pain is been present for several months, worsened in the last week.  On exam, patient appears nontoxic.  She has an old deformity of the left elbow.  X-rays obtained from triage viewed and independently interpreted by me, shows degenerative disease, but no acute fracture or dislocation.  Per radiology, there is a mild effusion.  Exam is not consistent with infection, there is no erythema, warmth, no difficulty ranging.  Pain extends from the trapezius muscle to the distal forearm, likely muscular in nature.  Will treat with muscle relaxers and steroids.  Patient is on blood thinner, unable to take NSAIDs. Additionally, towards the end of the visit she reports feeling tired.  This is been going on for several months.  Will obtain basic labs to ensure no anemia or electrolyte abnormality.  EKG ordered. No sxs of infection. Considering length of time of sxs and stable vital signs, doubt infection.   EKG shows A. fib, patient has a history of this, no signs of ischemia.  Labs interpreted by me, overall reassuring.  No anemia.  No electrolyte abnormality.  Discussed findings with patient.  Discussed importance of follow-up with PCP for  further evaluation of her fatigue.  Encouraged her to follow-up with orthopedics for evaluation of her left arm pain.  At this time, patient safe for discharge.  Return precautions given.  Patient states she understands and agrees to plan.  . Patient has No headache, No chest pain, No abdominal pain - No Nausea, No new weakness tingling or numbness, No Cough - SOB.  No problems updated.  ALLERGIES: Allergies  Allergen Reactions   Lisinopril Other (See Comments)    angioedema    PAST MEDICAL HISTORY: Past Medical History:  Diagnosis Date   Controlled type 2 diabetes mellitus ([place])    Family history of diabetes mellitus    Hyperlipidemia    Hypertension    Osteopenia    Status post right knee replacement     MEDICATIONS AT HOME: Prior to Admission medications   Medication Sig Start Date End Date Taking? Authorizing Provider  amLODipine (NORVASC) 5 MG tablet Take 1 tablet (5 mg total) by mouth daily. 03/28/21  Yes Ladell Pier, MD  atorvastatin (LIPITOR) 40 MG tablet TOME UNA TABLETA TODOS LOS DIAS 05/28/20  Yes Fulp, Cammie, MD  hydrochlorothiazide (MICROZIDE) 12.5 MG capsule Take 1 capsule (12.5 mg total) by mouth daily. TOME UNA CAPSULA TODOS LOS DIAS 03/28/21  Yes Ladell Pier, MD  isosorbide mononitrate (IMDUR) 30 MG 24 hr tablet Take 0.5 tablets (15 mg total) by mouth daily. 03/28/21  Yes Wynetta Emery,  Binnie Rail, MD  metFORMIN (GLUCOPHAGE) 500 MG tablet Take 1 tablet (500 mg total) by mouth 2 (two) times daily with a meal. 03/28/21  Yes Marcine Matar, MD  rivaroxaban (XARELTO) 20 MG TABS tablet Take 1 tablet (20 mg total) by mouth daily with supper. 03/28/21  Yes Marcine Matar, MD  famotidine (PEPCID) 20 MG tablet Take 1 tablet (20 mg total) by mouth 2 (two) times daily. 07/18/21   Anders Simmonds, PA-C  methocarbamol (ROBAXIN) 500 MG tablet Take 1 tablet (500 mg total) by mouth at bedtime as needed for muscle spasms. Patient not taking: Reported on 07/18/2021 06/25/21    Caccavale, Sophia, PA-C  mupirocin ointment (BACTROBAN) 2 % Apply 1 application topically 2 (two) times daily. 07/18/21  Yes Olaoluwa Grieder, Marzella Schlein, PA-C    ROS: Neg HEENT Neg resp Neg cardiac Neg MS Neg psych Neg neuro  Objective:   Vitals:   07/18/21 1014  BP: (!) 144/85  Pulse: 78  SpO2: 96%  Weight: 178 lb 6 oz (80.9 kg)  Height: 4\' 7"  (1.397 m)   Exam General appearance : Awake, alert, not in any distress. Speech Clear. Not toxic looking HEENT: Atraumatic and Normocephalic Neck: Supple, no JVD. No cervical lymphadenopathy.  Chest: Good air entry bilaterally, CTAB.  No rales/rhonchi/wheezing CVS: S1 S2 regular, no murmurs.  Abdomen: Bowel sounds present, Non tender and not distended with no gaurding, rigidity or rebound. Extremities: B/L Lower Ext shows no edema, both legs are warm to touch Neurology: Awake alert, and oriented X 3, CN II-XII intact, Non focal Skin: <1cm area of what appears to be an actinic keratosis vs skin CA  Data Review Lab Results  Component Value Date   HGBA1C 6.5 03/28/2021   HGBA1C 6.5 (H) 10/11/2020   HGBA1C 6.3 (H) 05/28/2020    Assessment & Plan   1. Type 2 diabetes mellitus with obesity (HCC) Continue current regimen-glucose 125 today - Glucose (CBG)  2. Abdominal bloating Non-acute abdomen - famotidine (PEPCID) 20 MG tablet; Take 1 tablet (20 mg total) by mouth 2 (two) times daily.  Dispense: 6 tablet; Refill: 0 - Ambulatory referral to Gastroenterology  3. Dyspepsia - famotidine (PEPCID) 20 MG tablet; Take 1 tablet (20 mg total) by mouth 2 (two) times daily.  Dispense: 6 tablet; Refill: 0 - Ambulatory referral to Gastroenterology  4. Encounter for examination following treatment at hospital   5. Skin change - mupirocin ointment (BACTROBAN) 2 %; Apply 1 application topically 2 (two) times daily.  Dispense: 22 g; Refill: 0 - Ambulatory referral to Dermatology  6. History of Helicobacter pylori infection - Ambulatory referral to  Gastroenterology  7. Language barrier [employer] "Paula Compton" interpreters used and additional time performing visit was required.     Patient have been counseled extensively about nutrition and exercise. Other issues discussed during this visit include: low cholesterol diet, weight control and daily exercise, foot care, annual eye examinations at Ophthalmology, importance of adherence with medications and regular follow-up. We also discussed long term complications of uncontrolled diabetes and hypertension.   Return in about 2 months (around 09/15/2021) for see PCP for chronic conditions and A1C.  The patient was given clear instructions to go to ER or return to medical center if symptoms don't improve, worsen or new problems develop. The patient verbalized understanding. The patient was told to call to get lab results if they haven't heard anything in the next week.      Georgian Co, PA-C [facility] and  [place], [place]   07/18/2021, 10:55 AM

## 2021-07-18 ENCOUNTER — Other Ambulatory Visit: Payer: Self-pay

## 2021-07-18 ENCOUNTER — Ambulatory Visit: Payer: Medicare HMO | Attending: Physician Assistant | Admitting: Physician Assistant

## 2021-07-18 ENCOUNTER — Encounter: Payer: Self-pay | Admitting: Physician Assistant

## 2021-07-18 VITALS — BP 144/85 | HR 78 | Ht <= 58 in | Wt 178.4 lb

## 2021-07-18 DIAGNOSIS — R140 Abdominal distension (gaseous): Secondary | ICD-10-CM | POA: Diagnosis not present

## 2021-07-18 DIAGNOSIS — R1013 Epigastric pain: Secondary | ICD-10-CM

## 2021-07-18 DIAGNOSIS — Z789 Other specified health status: Secondary | ICD-10-CM

## 2021-07-18 DIAGNOSIS — Z09 Encounter for follow-up examination after completed treatment for conditions other than malignant neoplasm: Secondary | ICD-10-CM | POA: Diagnosis not present

## 2021-07-18 DIAGNOSIS — Z8619 Personal history of other infectious and parasitic diseases: Secondary | ICD-10-CM | POA: Diagnosis not present

## 2021-07-18 DIAGNOSIS — R239 Unspecified skin changes: Secondary | ICD-10-CM | POA: Diagnosis not present

## 2021-07-18 DIAGNOSIS — E1169 Type 2 diabetes mellitus with other specified complication: Secondary | ICD-10-CM

## 2021-07-18 DIAGNOSIS — Z603 Acculturation difficulty: Secondary | ICD-10-CM

## 2021-07-18 DIAGNOSIS — E669 Obesity, unspecified: Secondary | ICD-10-CM | POA: Diagnosis not present

## 2021-07-18 DIAGNOSIS — Z6841 Body Mass Index (BMI) 40.0 and over, adult: Secondary | ICD-10-CM | POA: Diagnosis not present

## 2021-07-18 LAB — GLUCOSE, POCT (MANUAL RESULT ENTRY): POC Glucose: 125 mg/dl — AB (ref 70–99)

## 2021-07-18 MED ORDER — MUPIROCIN 2 % EX OINT: 1.0000 "application " | TOPICAL_OINTMENT | Freq: Two times a day (BID) | CUTANEOUS | 0 refills | Status: DC

## 2021-07-18 MED ORDER — FAMOTIDINE 20 MG PO TABS: 20.0000 mg | ORAL_TABLET | Freq: Two times a day (BID) | ORAL | 0 refills | Status: DC

## 2021-08-08 DIAGNOSIS — R140 Abdominal distension (gaseous): Secondary | ICD-10-CM | POA: Diagnosis not present

## 2021-08-08 DIAGNOSIS — R5383 Other fatigue: Secondary | ICD-10-CM | POA: Diagnosis not present

## 2021-08-08 DIAGNOSIS — Z8619 Personal history of other infectious and parasitic diseases: Secondary | ICD-10-CM | POA: Diagnosis not present

## 2021-08-15 DIAGNOSIS — R140 Abdominal distension (gaseous): Secondary | ICD-10-CM | POA: Diagnosis not present

## 2021-08-15 DIAGNOSIS — Z8619 Personal history of other infectious and parasitic diseases: Secondary | ICD-10-CM | POA: Diagnosis not present

## 2021-09-20 ENCOUNTER — Other Ambulatory Visit: Payer: Self-pay | Admitting: Internal Medicine

## 2021-09-20 DIAGNOSIS — E1169 Type 2 diabetes mellitus with other specified complication: Secondary | ICD-10-CM

## 2021-09-20 DIAGNOSIS — I10 Essential (primary) hypertension: Secondary | ICD-10-CM

## 2021-09-20 DIAGNOSIS — E669 Obesity, unspecified: Secondary | ICD-10-CM

## 2021-09-20 NOTE — Telephone Encounter (Signed)
Requested medication (s) are due for refill today:   Yes for all 4 ? ?Requested medication (s) are on the active medication list:   Yes for all 4 ? ?Future visit scheduled:   No   Was for 3 mo f/u around first of March  ? ? ?Last ordered: All 4  - 03/28/2021 with 90 day supply with 1 refill. ? ?Returned because protocol criteria not met.  BP out of range, B12 due, and no 3 mo. F/u appt noted.  ? ?Requested Prescriptions  ?Pending Prescriptions Disp Refills  ? amLODipine (NORVASC) 5 MG tablet [Pharmacy Med Name: AMLODIPINE BESYLATE 5 MG TAB] 90 tablet 1  ?  Sig: Take 1 tablet (5 mg total) by mouth daily.  ?  ? Cardiovascular: Calcium Channel Blockers 2 Failed - 09/20/2021  1:47 AM  ?  ?  Failed - Last BP in normal range  ?  BP Readings from Last 1 Encounters:  ?07/18/21 (!) 144/85  ?  ?  ?  ?  Passed - Last Heart Rate in normal range  ?  Pulse Readings from Last 1 Encounters:  ?07/18/21 78  ?  ?  ?  ?  Passed - Valid encounter within last 6 months  ?  Recent Outpatient Visits   ? ?      ? 2 months ago Type 2 diabetes mellitus with obesity ([place])  ? [place], [place], [place]  ? 5 months ago Chronic pain of left knee  ? [place], [place]  ? 5 months ago Type 2 diabetes mellitus with obesity ([place])  ? [place] Ladell Pier, MD  ? 11 months ago Essential hypertension  ? [place], RPH-CPP  ? 11 months ago Type 2 diabetes mellitus with other circulatory complications ([place])  ? [place] Ladell Pier, MD  ? ?  ?  ? ?  ?  ?  ? metFORMIN (GLUCOPHAGE) 500 MG tablet [Pharmacy Med Name: METFORMIN HCL 500 MG TABLET] 180 tablet 1  ?  Sig: TAKE 1 TABLET BY MOUTH 2 TIMES DAILY WITH A MEAL.  ?  ? Endocrinology:  Diabetes - Biguanides Failed - 09/20/2021  1:48 AM  ?  ?  Failed - B12 Level in normal range and within 720 days  ?   Vitamin B-12  ?Date Value Ref Range Status  ?01/20/2019 328 232 - 1,245 pg/mL Final  ?  ?  ?  ?  Passed - Cr in normal range and within 360 days  ?  Creatinine, Ser  ?Date Value Ref Range Status  ?06/25/2021 0.60 0.44 - 1.00 mg/dL Final  ?  ?  ?  ?  Passed - HBA1C is between 0 and 7.9 and within 180 days  ?  HbA1c, POC (prediabetic range)  ?Date Value Ref Range Status  ?09/08/2018 6.4 5.7 - 6.4 % Final  ? ?HbA1c, POC (controlled diabetic range)  ?Date Value Ref Range Status  ?03/28/2021 6.5 0.0 - 7.0 % Final  ?  ?  ?  ?  Passed - eGFR in normal range and within 360 days  ?  GFR calc Af Amer  ?Date Value Ref Range Status  ?05/28/2020 101 >59 mL/min/1.73 Final  ?  Comment:  ?  **In accordance with recommendations from the NKF-ASN Task force,** ?  [facility] is in the process  of updating its eGFR calculation to the ?  2021 CKD-EPI creatinine equation that estimates kidney function ?  without a race variable. ?  ? ?GFR, Estimated  ?Date Value Ref Range Status  ?06/25/2021 >60 >60 mL/min Final  ?  Comment:  ?  (NOTE) ?Calculated using the CKD-EPI Creatinine Equation (2021) ?  ? ?eGFR  ?Date Value Ref Range Status  ?03/28/2021 88 >59 mL/min/1.73 Final  ?  ?  ?  ?  Passed - Valid encounter within last 6 months  ?  Recent Outpatient Visits   ? ?      ? 2 months ago Type 2 diabetes mellitus with obesity ([place])  ? [place], [place], [place]  ? 5 months ago Chronic pain of left knee  ? [place], [place]  ? 5 months ago Type 2 diabetes mellitus with obesity ([place])  ? [place] Ladell Pier, MD  ? 11 months ago Essential hypertension  ? [place], RPH-CPP  ? 11 months ago Type 2 diabetes mellitus with other circulatory complications ([place])  ? [place] Ladell Pier, MD  ? ?  ?  ? ?  ?  ?  Passed - CBC within normal limits  and completed in the last 12 months  ?  WBC  ?Date Value Ref Range Status  ?06/25/2021 6.9 4.0 - 10.5 K/uL Final  ? ?RBC  ?Date Value Ref Range Status  ?06/25/2021 4.28 3.87 - 5.11 MIL/uL Final  ? ?Hemoglobin  ?Date Value Ref Range Status  ?06/25/2021 12.4 12.0 - 15.0 g/dL Final  ?03/28/2021 14.2 11.1 - 15.9 g/dL Final  ? ?HCT  ?Date Value Ref Range Status  ?06/25/2021 40.2 36.0 - 46.0 % Final  ? ?Hematocrit  ?Date Value Ref Range Status  ?03/28/2021 41.4 34.0 - 46.6 % Final  ? ?MCHC  ?Date Value Ref Range Status  ?06/25/2021 30.8 30.0 - 36.0 g/dL Final  ? ?MCH  ?Date Value Ref Range Status  ?06/25/2021 29.0 26.0 - 34.0 pg Final  ? ?MCV  ?Date Value Ref Range Status  ?06/25/2021 93.9 80.0 - 100.0 fL Final  ?03/28/2021 85 79 - 97 fL Final  ? ?No results found for: PLTCOUNTKUC, LABPLAT, [place] ?RDW  ?Date Value Ref Range Status  ?06/25/2021 14.5 11.5 - 15.5 % Final  ?03/28/2021 13.7 11.7 - 15.4 % Final  ? ?  ?  ?  ? isosorbide mononitrate (IMDUR) 30 MG 24 hr tablet [Pharmacy Med Name: ISOSORBIDE MONONIT ER 30 MG TB] 45 tablet 1  ?  Sig: TAKE 1/2 OF A TABLET (15 MG TOTAL) POR VIA ORAL A DIARIO  ?  ? Cardiovascular:  Nitrates Failed - 09/20/2021  1:48 AM  ?  ?  Failed - Last BP in normal range  ?  BP Readings from Last 1 Encounters:  ?07/18/21 (!) 144/85  ?  ?  ?  ?  Passed - Last Heart Rate in normal range  ?  Pulse Readings from Last 1 Encounters:  ?07/18/21 78  ?  ?  ?  ?  Passed - Valid encounter within last 12 months  ?  Recent Outpatient Visits   ? ?      ? 2 months ago Type 2 diabetes mellitus with obesity ([place])  ? [place], [place], [place]  ?  5 months ago Chronic pain of left knee  ? [place], [place]  ? 5 months ago Type 2 diabetes mellitus with obesity ([place])  ? [place] Ladell Pier, MD  ? 11 months ago Essential hypertension  ? [place], RPH-CPP  ? 11 months ago Type 2 diabetes mellitus with other circulatory complications ([place])  ? [place] Ladell Pier, MD  ? ?  ?  ? ?  ?  ?  ? hydrochlorothiazide (MICROZIDE) 12.5 MG capsule [Pharmacy Med Name: HYDROCHLOROTHIAZIDE 12.5 MG CP] 90 capsule 1  ?  Sig: Take 1 capsule (12.5 mg total) by mouth daily. TOME UNA CAPSULA TODOS LOS DIAS  ?  ? Cardiovascular: Diuretics - Thiazide Failed - 09/20/2021  1:48 AM  ?  ?  Failed - Last BP in normal range  ?  BP Readings from Last 1 Encounters:  ?07/18/21 (!) 144/85  ?  ?  ?  ?  Passed - Cr in normal range and within 180 days  ?  Creatinine, Ser  ?Date Value Ref Range Status  ?06/25/2021 0.60 0.44 - 1.00 mg/dL Final  ?  ?  ?  ?  Passed - K in normal range and within 180 days  ?  Potassium  ?Date Value Ref Range Status  ?06/25/2021 4.0 3.5 - 5.1 mmol/L Final  ?  ?  ?  ?  Passed - Na in normal range and within 180 days  ?  Sodium  ?Date Value Ref Range Status  ?06/25/2021 136 135 - 145 mmol/L Final  ?03/28/2021 141 134 - 144 mmol/L Final  ?  ?  ?  ?  Passed - Valid encounter within last 6 months  ?  Recent Outpatient Visits   ? ?      ? 2 months ago Type 2 diabetes mellitus with obesity ([place])  ? [place], [place], [place]  ? 5 months ago Chronic pain of left knee  ? [place], [place]  ? 5 months ago Type 2 diabetes mellitus with obesity ([place])  ? [place] Ladell Pier, MD  ? 11 months ago Essential hypertension  ? [place], RPH-CPP  ? 11 months ago Type 2 diabetes mellitus with other circulatory complications ([place])  ? [place] Ladell Pier, MD  ? ?  ?  ? ?  ?  ?  ? ?

## 2021-10-04 DIAGNOSIS — L821 Other seborrheic keratosis: Secondary | ICD-10-CM | POA: Diagnosis not present

## 2021-11-14 DIAGNOSIS — R1013 Epigastric pain: Secondary | ICD-10-CM | POA: Diagnosis not present

## 2021-11-14 DIAGNOSIS — R1310 Dysphagia, unspecified: Secondary | ICD-10-CM | POA: Diagnosis not present

## 2021-11-14 DIAGNOSIS — K2930 Chronic superficial gastritis without bleeding: Secondary | ICD-10-CM | POA: Diagnosis not present

## 2021-11-14 DIAGNOSIS — B9681 Helicobacter pylori [H. pylori] as the cause of diseases classified elsewhere: Secondary | ICD-10-CM | POA: Diagnosis not present

## 2021-11-20 DIAGNOSIS — B9681 Helicobacter pylori [H. pylori] as the cause of diseases classified elsewhere: Secondary | ICD-10-CM | POA: Diagnosis not present

## 2021-11-20 DIAGNOSIS — K2930 Chronic superficial gastritis without bleeding: Secondary | ICD-10-CM | POA: Diagnosis not present

## 2021-12-17 ENCOUNTER — Other Ambulatory Visit: Payer: Self-pay

## 2021-12-17 ENCOUNTER — Emergency Department (HOSPITAL_COMMUNITY): Payer: Medicare HMO

## 2021-12-17 ENCOUNTER — Emergency Department (HOSPITAL_COMMUNITY)
Admission: EM | Admit: 2021-12-17 | Discharge: 2021-12-17 | Disposition: A | Discharge status: Home / Self Care | Payer: Medicare HMO | Attending: Emergency Medicine | Admitting: Emergency Medicine

## 2021-12-17 DIAGNOSIS — R1010 Upper abdominal pain, unspecified: Secondary | ICD-10-CM | POA: Diagnosis present

## 2021-12-17 DIAGNOSIS — Z7901 Long term (current) use of anticoagulants: Secondary | ICD-10-CM | POA: Insufficient documentation

## 2021-12-17 DIAGNOSIS — Z7984 Long term (current) use of oral hypoglycemic drugs: Secondary | ICD-10-CM | POA: Diagnosis not present

## 2021-12-17 DIAGNOSIS — K429 Umbilical hernia without obstruction or gangrene: Secondary | ICD-10-CM | POA: Diagnosis not present

## 2021-12-17 DIAGNOSIS — Z79899 Other long term (current) drug therapy: Secondary | ICD-10-CM | POA: Diagnosis not present

## 2021-12-17 DIAGNOSIS — K760 Fatty (change of) liver, not elsewhere classified: Secondary | ICD-10-CM | POA: Diagnosis not present

## 2021-12-17 DIAGNOSIS — E119 Type 2 diabetes mellitus without complications: Secondary | ICD-10-CM | POA: Insufficient documentation

## 2021-12-17 DIAGNOSIS — R1013 Epigastric pain: Secondary | ICD-10-CM | POA: Insufficient documentation

## 2021-12-17 LAB — CBC WITH DIFFERENTIAL/PLATELET
Abs Immature Granulocytes: 0.04 10*3/uL (ref 0.00–0.07)
Basophils Absolute: 0 10*3/uL (ref 0.0–0.1)
Basophils Relative: 1 %
Eosinophils Absolute: 0.2 10*3/uL (ref 0.0–0.5)
Eosinophils Relative: 3 %
HCT: 41.2 % (ref 36.0–46.0)
Hemoglobin: 13.4 g/dL (ref 12.0–15.0)
Immature Granulocytes: 1 %
Lymphocytes Relative: 21 %
Lymphs Abs: 1.7 10*3/uL (ref 0.7–4.0)
MCH: 29.6 pg (ref 26.0–34.0)
MCHC: 32.5 g/dL (ref 30.0–36.0)
MCV: 90.9 fL (ref 80.0–100.0)
Monocytes Absolute: 0.4 10*3/uL (ref 0.1–1.0)
Monocytes Relative: 5 %
Neutro Abs: 6 10*3/uL (ref 1.7–7.7)
Neutrophils Relative %: 69 %
Platelets: 185 10*3/uL (ref 150–400)
RBC: 4.53 MIL/uL (ref 3.87–5.11)
RDW: 14.5 % (ref 11.5–15.5)
WBC: 8.4 10*3/uL (ref 4.0–10.5)
nRBC: 0 % (ref 0.0–0.2)

## 2021-12-17 LAB — COMPREHENSIVE METABOLIC PANEL
ALT: 34 U/L (ref 0–44)
AST: 42 U/L — ABNORMAL HIGH (ref 15–41)
Albumin: 3.9 g/dL (ref 3.5–5.0)
Alkaline Phosphatase: 91 U/L (ref 38–126)
Anion gap: 9 (ref 5–15)
BUN: 12 mg/dL (ref 8–23)
CO2: 25 mmol/L (ref 22–32)
Calcium: 9.2 mg/dL (ref 8.9–10.3)
Chloride: 105 mmol/L (ref 98–111)
Creatinine, Ser: 0.77 mg/dL (ref 0.44–1.00)
GFR, Estimated: >60 mL/min (ref >60)
Glucose, Bld: 125 mg/dL — ABNORMAL HIGH (ref 70–99)
Potassium: 3.8 mmol/L (ref 3.5–5.1)
Sodium: 139 mmol/L (ref 135–145)
Total Bilirubin: 0.8 mg/dL (ref 0.3–1.2)
Total Protein: 8 g/dL (ref 6.5–8.1)

## 2021-12-17 LAB — URINALYSIS, ROUTINE W REFLEX MICROSCOPIC
Bilirubin Urine: NEGATIVE
Glucose, UA: NEGATIVE mg/dL
Hgb urine dipstick: NEGATIVE
Ketones, ur: NEGATIVE mg/dL
Leukocytes,Ua: NEGATIVE
Nitrite: NEGATIVE
Protein, ur: NEGATIVE mg/dL
Specific Gravity, Urine: 1.006 (ref 1.005–1.030)
pH: 7 (ref 5.0–8.0)

## 2021-12-17 LAB — LIPASE, BLOOD: Lipase: 39 U/L (ref 11–51)

## 2021-12-17 MED ORDER — LIDOCAINE VISCOUS HCL 2 % MT SOLN
15.0000 mL | Freq: Once | OROMUCOSAL | Status: AC
  Administered 2021-12-17: 15 mL via ORAL
  Filled 2021-12-17: qty 15

## 2021-12-17 MED ORDER — IOHEXOL 300 MG/ML  SOLN
100.0000 mL | Freq: Once | INTRAMUSCULAR | Status: AC | PRN
  Administered 2021-12-17: 100 mL via INTRAVENOUS

## 2021-12-17 MED ORDER — FAMOTIDINE IN NACL 20-0.9 MG/50ML-% IV SOLN
20.0000 mg | Freq: Once | INTRAVENOUS | Status: AC
  Administered 2021-12-17: 20 mg via INTRAVENOUS
  Filled 2021-12-17: qty 50

## 2021-12-17 MED ORDER — SODIUM CHLORIDE 0.9 % IV BOLUS
1000.0000 mL | Freq: Once | INTRAVENOUS | Status: AC
  Administered 2021-12-17: 1000 mL via INTRAVENOUS

## 2021-12-17 MED ORDER — PANTOPRAZOLE SODIUM 20 MG PO TBEC: 20.0000 mg | DELAYED_RELEASE_TABLET | Freq: Every day | ORAL | 0 refills | Status: DC

## 2021-12-17 MED ORDER — ALUM & MAG HYDROXIDE-SIMETH 200-200-20 MG/5ML PO SUSP
30.0000 mL | Freq: Once | ORAL | Status: AC
Start: 2021-12-17 — End: 2021-12-17
  Administered 2021-12-17: 30 mL via ORAL
  Filled 2021-12-17: qty 30

## 2021-12-17 NOTE — ED Notes (Addendum)
Patient transported to CT 

## 2021-12-17 NOTE — ED Triage Notes (Signed)
Pt/translator stated, A year has been bother me for a year , No N/V, No urinary symptoms.

## 2021-12-17 NOTE — ED Provider Notes (Signed)
[facility] EMERGENCY DEPARTMENT Provider Note   CSN: [id number] Arrival date & time: 12/17/21  0845     History  Chief Complaint  Patient presents with   Abdominal Pain    Emily Hamilton is a 76 y.o. female with a PMHx DM, who presents to the ED complaining of upper abdominal pain onset 1 year. She has a gastroenterologist, Dr. Paulita Fujita who evaluated her with an EGD on 11/14/2021.  At that time no acute findings other than gastritis which was biopsied.  No meds tried prior to arrival.  Patient denies past medical history of GERD.  Denies nausea, vomiting, fever, chills, dysuria, hematuria, chest pain, shortness of breath.   The history is provided by the patient. A language interpreter was used (spanish).      Home Medications Prior to Admission medications   Medication Sig Start Date End Date Taking? Authorizing Provider  pantoprazole (PROTONIX) 20 MG tablet Take 1 tablet (20 mg total) by mouth daily. 12/17/21 01/16/22 Yes Shineka Auble A, PA-C  amLODipine (NORVASC) 5 MG tablet TAKE 1 TABLET (5 MG TOTAL) BY MOUTH DAILY. 09/25/21   Ladell Pier, MD  atorvastatin (LIPITOR) 40 MG tablet TOME UNA TABLETA TODOS LOS DIAS 05/28/20   Fulp, Cammie, MD  famotidine (PEPCID) 20 MG tablet Take 1 tablet (20 mg total) by mouth 2 (two) times daily. 07/18/21   Argentina Donovan, PA-C  hydrochlorothiazide (MICROZIDE) 12.5 MG capsule TAKE 1 CAPSULE (12.5 MG TOTAL) BY MOUTH DAILY. TOME UNA CAPSULA TODOS LOS DIAS 09/25/21   Ladell Pier, MD  isosorbide mononitrate (IMDUR) 30 MG 24 hr tablet TAKE 1/2 OF A TABLET (15 MG TOTAL) POR VIA ORAL A DIARIO 09/25/21   Ladell Pier, MD  metFORMIN (GLUCOPHAGE) 500 MG tablet TAKE 1 TABLET BY MOUTH 2 TIMES DAILY WITH A MEAL. 09/25/21   Ladell Pier, MD  methocarbamol (ROBAXIN) 500 MG tablet Take 1 tablet (500 mg total) by mouth at bedtime as needed for muscle spasms. Patient not taking: Reported on 07/18/2021 06/25/21   Caccavale,  Sophia, PA-C  mupirocin ointment (BACTROBAN) 2 % Apply 1 application topically 2 (two) times daily. 07/18/21   Argentina Donovan, PA-C  rivaroxaban (XARELTO) 20 MG TABS tablet Take 1 tablet (20 mg total) by mouth daily with supper. 03/28/21   Ladell Pier, MD      Allergies    Lisinopril    Review of Systems   Review of Systems  Constitutional:  Negative for chills and fever.  Respiratory:  Negative for shortness of breath.   Cardiovascular:  Negative for chest pain.  Gastrointestinal:  Positive for abdominal pain. Negative for nausea and vomiting.  Genitourinary:  Negative for dysuria and hematuria.  All other systems reviewed and are negative.  Physical Exam Updated Vital Signs BP 132/86   Pulse 79   Temp 98.4 F (36.9 C) (Oral)   Resp 16   SpO2 97%  Physical Exam Vitals and nursing note reviewed.  Constitutional:      General: She is not in acute distress.    Appearance: She is not diaphoretic.  HENT:     Head: Normocephalic and atraumatic.     Mouth/Throat:     Pharynx: No oropharyngeal exudate.  Eyes:     General: No scleral icterus.    Conjunctiva/sclera: Conjunctivae normal.  Cardiovascular:     Rate and Rhythm: Normal rate and regular rhythm.     Pulses: Normal pulses.     Heart  sounds: Normal heart sounds.  Pulmonary:     Effort: Pulmonary effort is normal. No respiratory distress.     Breath sounds: Normal breath sounds. No wheezing.  Abdominal:     General: Bowel sounds are normal.     Palpations: Abdomen is soft. There is no mass.     Tenderness: There is abdominal tenderness (mild) in the epigastric area. There is no guarding or rebound.  Musculoskeletal:        General: Normal range of motion.     Cervical back: Normal range of motion and neck supple.  Skin:    General: Skin is warm and dry.  Neurological:     Mental Status: She is alert.  Psychiatric:        Behavior: Behavior normal.    ED Results / Procedures / Treatments   Labs (all  labs ordered are listed, but only abnormal results are displayed) Labs Reviewed  COMPREHENSIVE METABOLIC PANEL - Abnormal; Notable for the following components:      Result Value   Glucose, Bld 125 (*)    AST 42 (*)    All other components within normal limits  URINALYSIS, ROUTINE W REFLEX MICROSCOPIC - Abnormal; Notable for the following components:   Color, Urine STRAW (*)    All other components within normal limits  CBC WITH DIFFERENTIAL/PLATELET  LIPASE, BLOOD    EKG EKG Interpretation  Date/Time:  Tuesday December 17 2021 15:17:39 EDT Ventricular Rate:  69 PR Interval:    QRS Duration: 77 QT Interval:  540 QTC Calculation: 579 R Axis:   66 Text Interpretation: Atrial fibrillation Nonspecific T abnormalities, diffuse leads Prolonged QT interval T wave changes old Confirmed by Lavenia Atlas [phone]) on 12/17/2021 3:21:00 PM  Radiology CT ABDOMEN PELVIS W CONTRAST  Result Date: 12/17/2021 CLINICAL DATA:  Epigastric pain epigastric abdominal pain EXAM: CT ABDOMEN AND PELVIS WITH CONTRAST TECHNIQUE: Multidetector CT imaging of the abdomen and pelvis was performed using the standard protocol following bolus administration of intravenous contrast. RADIATION DOSE REDUCTION: This exam was performed according to the departmental dose-optimization program which includes automated exposure control, adjustment of the mA and/or kV according to patient size and/or use of iterative reconstruction technique. CONTRAST:  135mL OMNIPAQUE IOHEXOL 300 MG/ML  SOLN COMPARISON:  None Available. FINDINGS: Lower chest: There is a 4 mm solid pulmonary nodule in the right middle lobe (series 4, image 3). There is a 5 mm ground-glass nodule in the medial basilar right lower lobe (series 4, image 10). Hepatobiliary: Mildly nodular liver contours. Mild hepatic steatosis. Prior cholecystectomy. Pancreas: Unremarkable. No pancreatic ductal dilatation or surrounding inflammatory changes. Spleen: Normal in size without  focal abnormality. Adrenals/Urinary Tract: Adrenal glands are unremarkable. Mild prominence of the renal collecting systems bilaterally likely due to a distended bladder. Stomach/Bowel: The stomach is within normal limits. There is no evidence of bowel obstruction.The appendix is normal. Colonic diverticulosis most severely affecting the sigmoid colon. Vascular/Lymphatic: Scattered atherosclerosis. No AAA. No lymphadenopathy. Reproductive: Unremarkable. Other: Small umbilical hernia containing nonobstructed small bowel. No free air. No ascites. Musculoskeletal: No acute osseous abnormality. No suspicious lytic or blastic lesions. Multilevel degenerative changes of the spine with grade 1 anterolisthesis at L4-L5 and L5-S1 and trace retrolisthesis at T12-L1 and L1-L2. Mild bilateral hip osteoarthritis. IMPRESSION: No acute findings in the abdomen or pelvis.  Normal appendix. Prior cholecystectomy. Colonic diverticulosis, most severely affecting the sigmoid. No evidence of acute diverticulitis. Small umbilical hernia containing non-obstructed small bowel. Hepatic steatosis with mildly nodular liver contours,  may suggest developing cirrhosis. Solid 4 mm and ground-glass 5 mm pulmonary nodules in the right lower lobe. No follow-up needed if patient is low-risk (and has no known or suspected primary neoplasm). Non-contrast chest CT can be considered in 12 months if patient is high-risk. This recommendation follows the consensus statement: Guidelines for Management of Incidental Pulmonary Nodules Detected on CT Images: From the Fleischner Society 2017; Radiology 2017; 284:228-243. Electronically Signed   By: Maurine Simmering M.D.   On: 12/17/2021 14:27    Procedures Procedures    Medications Ordered in ED Medications  sodium chloride 0.9 % bolus 1,000 mL (0 mLs Intravenous Stopped 12/17/21 1625)  famotidine (PEPCID) IVPB 20 mg premix (0 mg Intravenous Stopped 12/17/21 1441)  alum & mag hydroxide-simeth (MAALOX/MYLANTA)  200-200-20 MG/5ML suspension 30 mL (30 mLs Oral Given 12/17/21 1305)    And  lidocaine (XYLOCAINE) 2 % viscous mouth solution 15 mL (15 mLs Oral Given 12/17/21 1306)  iohexol (OMNIPAQUE) 300 MG/ML solution 100 mL (100 mLs Intravenous Contrast Given 12/17/21 1400)    ED Course/ Medical Decision Making/ A&P Clinical Course as of 12/17/21 1754  Tue Dec 17, 2021  1232 Reviewed labs with patient and discussed treatment plan with patient.  Patient agreeable at this time. [SB]  1242 Re-evaluated and noted improvement of symptoms with treatment regimen. Discussed discharge treatment plan. Pt agreeable at this time. Pt appears safe for discharge. [SB]  1243 Patient reevaluated and noted slight improvement with treatment regimen in the ED. [SB]  1439 Re-evaluated and discussed lab and imaging findings with patient at bedside.  [SB]    Clinical Course User Index [SB] Camdon Saetern A, PA-C                           Medical Decision Making Amount and/or Complexity of Data Reviewed Labs: ordered. Radiology: ordered.  Risk OTC drugs. Prescription drug management.   Patient presents to the emergency department with upper abdominal pain onset 1 year.  Denies any change in her abdominal pain however notes that she came in due to the bloating. Patient gastroenterologist is Dr. Paulita Fujita.  On exam patient with mild epigastric tenderness to palpation.  Remainder of exam without acute findings.  Pt afebrile. Differential diagnosis includes gastritis, GERD, pancreatitis, cholecystitis.    Additional history obtained:  External records from outside source obtained and reviewed including: Patient brought in EGD results from 11/16/2021 with gastroenterologist, Dr. Paulita Fujita.  Initial impression noted gastritis that was biopsied.  Review of chart, unable to find pathology results.  Labs:  I ordered, and personally interpreted labs.  The pertinent results include:   Lipase unremarkable. CBC unremarkable. CMP with  slightly elevated glucose of 125 otherwise unremarkable. Urinalysis unremarkable  Imaging: I ordered imaging studies including CT abdomen pelvis I independently visualized and interpreted imaging which showed  No acute findings in the abdomen or pelvis.  Normal appendix.  Prior cholecystectomy. Colonic diverticulosis, most severely  affecting the sigmoid. No evidence of acute diverticulitis.  Small umbilical hernia containing non-obstructed small bowel.  Hepatic steatosis with mildly nodular liver contours, may suggest  developing cirrhosis.  Solid 4 mm and ground-glass 5 mm pulmonary nodules in the right  lower lobe   I agree with the radiologist interpretation  Medications:  I ordered medication including GI cocktail, Pepcid, IV fluids for symptom management. Reevaluation of the patient after these medicines and interventions, I reevaluated the patient and found that they have improved I have reviewed  the patients home medicines and have made adjustments as needed Tolerated PO challenge in ED with above treatment regimen    Disposition: Presentation suspicious for gastritis as cause of symptoms.  Doubt pancreatitis or cholecystitis at this time.  Doubt acute cystitis at this time.. After consideration of the diagnostic results and the patients response to treatment, I feel that the patient would benefit from Discharge home.  Patient will be discharged home with a prescription for pantoprazole.  Also instructed patient that she needs to call her gastroenterologist and set up a follow-up appointment regarding today's ED visit. Supportive care measures and strict return precautions discussed with patient at bedside. Pt acknowledges and verbalizes understanding. Pt appears safe for discharge. Follow up as indicated in discharge paperwork.   This chart was dictated using voice recognition software, Dragon. Despite the best efforts of this provider to proofread and correct errors, errors may  still occur which can change documentation meaning.  Final Clinical Impression(s) / ED Diagnoses Final diagnoses:  Epigastric pain    Rx / DC Orders ED Discharge Orders          Ordered    pantoprazole (PROTONIX) 20 MG tablet  Daily        12/17/21 1549              Edder Bellanca A, PA-C 12/17/21 1754    Horton, Alvin Critchley, DO 12/18/21 1057

## 2021-12-17 NOTE — ED Provider Triage Note (Signed)
Emergency Medicine Provider Triage Evaluation Note  Emily Hamilton , a 76 y.o. female  was evaluated in triage.  Pt complains of upper abdominal pain onset 1 year.  She has a gastroenterologist, Dr. Dulce Sellar who evaluated her with an EGD on 11/14/2021.  At that time no acute findings other than gastritis which was biopsied.  No meds tried prior to arrival.  Patient denies past medical history of GERD.  Denies nausea, vomiting, fever, chills, dysuria, hematuria, chest pain, shortness of breath.  Review of Systems  Positive: As per HPI above Negative:   Physical Exam  BP (!) 159/67 (BP Location: Right Arm)   Pulse 79   Temp 98.4 F (36.9 C) (Oral)   Resp 16   SpO2 95%  Gen:   Awake, no distress  Resp:  Normal effort  MSK:   Moves extremities without difficulty  Other:  Mild epigastric TTP  Medical Decision Making  Medically screening exam initiated at 9:07 AM.  Appropriate orders placed.  Kaili Castille was informed that the remainder of the evaluation will be completed by another provider, this initial triage assessment does not replace that evaluation, and the importance of remaining in the ED until their evaluation is complete.  Work-up initiated   Carissa Musick A, PA-C 12/17/21 [phone]

## 2021-12-17 NOTE — ED Notes (Signed)
Discharge instructions reviewed with patient. Patient verbalized understanding of instructions. Follow-up care and medications were reviewed. Patient ambulatory with steady gait. VSS upon discharge.  ?

## 2021-12-17 NOTE — Discharge Instructions (Addendum)
It was a pleasure taking care of you today!   Your labs and imaging were negative today. Call your GI doctor, Dr. Paulita Fujita and set up a follow up appointment regarding todays ED visit and the CT scan findings.  Call your primary care provider to set up a follow-up appointment regarding today's ED visit.  You may also follow-up with your primary care provider regarding the incidental finding on the CT scan and the lung.  You will be sent a prescription for pantoprazole, take as directed.  Return to the ED if you are experiencing increasing/worsening abdominal pain, vomiting, fever, or worsening symptoms.   Fue un placer atenderte hoy!  Sus laboratorios e imgenes dieron negativo hoy. Llame a su mdico gastrointestinal, el Dr. Paulita Fujita, y programe una cita de seguimiento con respecto a la visita al servicio de urgencias de hoy y los hallazgos de la tomografa computarizada. Llame a su proveedor de atencin primaria para programar una cita de seguimiento con respecto a la visita al servicio de urgencias de hoy. Tambin puede realizar un seguimiento con su proveedor de atencin primaria con respecto al hallazgo incidental en la tomografa computarizada y el pulmn. Se le enviar una receta de pantoprazol, tmela segn las indicaciones. Regrese a la sala de urgencias si experimenta un aumento o empeoramiento del dolor abdominal, vmitos, fiebre o empeoramiento de los sntomas.

## 2021-12-20 ENCOUNTER — Other Ambulatory Visit: Payer: Self-pay | Admitting: Internal Medicine

## 2021-12-20 DIAGNOSIS — I4821 Permanent atrial fibrillation: Secondary | ICD-10-CM

## 2021-12-20 NOTE — Telephone Encounter (Signed)
Requested Prescriptions  Pending Prescriptions Disp Refills  . XARELTO 20 MG TABS tablet [Pharmacy Med Name: XARELTO 20 MG TABLET] 90 tablet 1    Sig: TAKE 1 TABLET BY MOUTH DAILY WITH SUPPER.     Hematology: Anticoagulants - rivaroxaban Failed - 12/20/2021  2:12 AM      Failed - AST in normal range and within 360 days    AST  Date Value Ref Range Status  12/17/2021 42 (H) 15 - 41 U/L Final         Passed - ALT in normal range and within 360 days    ALT  Date Value Ref Range Status  12/17/2021 34 0 - 44 U/L Final         Passed - Cr in normal range and within 360 days    Creatinine, Ser  Date Value Ref Range Status  12/17/2021 0.77 0.44 - 1.00 mg/dL Final         Passed - HCT in normal range and within 360 days    HCT  Date Value Ref Range Status  12/17/2021 41.2 36.0 - 46.0 % Final   Hematocrit  Date Value Ref Range Status  03/28/2021 41.4 34.0 - 46.6 % Final         Passed - HGB in normal range and within 360 days    Hemoglobin  Date Value Ref Range Status  12/17/2021 13.4 12.0 - 15.0 g/dL Final  03/28/2021 14.2 11.1 - 15.9 g/dL Final         Passed - PLT in normal range and within 360 days    Platelets  Date Value Ref Range Status  12/17/2021 185 150 - 400 K/uL Final  03/28/2021 214 150 - 450 x10E3/uL Final         Passed - eGFR is 15 or above and within 360 days    GFR calc Af Amer  Date Value Ref Range Status  05/28/2020 101 >59 mL/min/1.73 Final    Comment:    **In accordance with recommendations from the NKF-ASN Task force,**   [facility] is in the process of updating its eGFR calculation to the   2021 CKD-EPI creatinine equation that estimates kidney function   without a race variable.    GFR, Estimated  Date Value Ref Range Status  12/17/2021 >60 >60 mL/min Final    Comment:    (NOTE) Calculated using the CKD-EPI Creatinine Equation (2021)    eGFR  Date Value Ref Range Status  03/28/2021 88 >59 mL/min/1.73 Final         Passed - Patient is  not pregnant      Passed - Valid encounter within last 12 months    Recent Outpatient Visits          5 months ago Type 2 diabetes mellitus with obesity [facility])   [place], [place], [place]   8 months ago Chronic pain of left knee   [place], Cari S, PA-C   8 months ago Type 2 diabetes mellitus with obesity [facility])   [place], Deborah B, MD   1 year ago Essential hypertension   [place], Jarome Matin, RPH-CPP   1 year ago Type 2 diabetes mellitus with other circulatory complications [facility])   [facility] Ladell Pier, MD

## 2021-12-23 ENCOUNTER — Other Ambulatory Visit: Payer: Self-pay | Admitting: Internal Medicine

## 2021-12-23 DIAGNOSIS — I10 Essential (primary) hypertension: Secondary | ICD-10-CM

## 2021-12-24 NOTE — Telephone Encounter (Signed)
Requested Prescriptions  Pending Prescriptions Disp Refills  . hydrochlorothiazide (MICROZIDE) 12.5 MG capsule [Pharmacy Med Name: HYDROCHLOROTHIAZIDE 12.5 MG CP] 90 capsule 0    Sig: TAKE 1 CAPSULE (12.5 MG TOTAL) BY MOUTH DAILY. TOME UNA CAPSULA TODOS LOS DIAS     Cardiovascular: Diuretics - Thiazide Passed - 12/23/2021  2:33 PM      Passed - Cr in normal range and within 180 days    Creatinine, Ser  Date Value Ref Range Status  12/17/2021 0.77 0.44 - 1.00 mg/dL Final         Passed - K in normal range and within 180 days    Potassium  Date Value Ref Range Status  12/17/2021 3.8 3.5 - 5.1 mmol/L Final         Passed - Na in normal range and within 180 days    Sodium  Date Value Ref Range Status  12/17/2021 139 135 - 145 mmol/L Final  03/28/2021 141 134 - 144 mmol/L Final         Passed - Last BP in normal range    BP Readings from Last 1 Encounters:  12/17/21 139/87         Passed - Valid encounter within last 6 months    Recent Outpatient Visits          5 months ago Type 2 diabetes mellitus with obesity [facility])   [place], [place], [place]   8 months ago Chronic pain of left knee   [place], Cari S, [place]   9 months ago Type 2 diabetes mellitus with obesity [facility])   [place], Deborah B, MD   1 year ago Essential hypertension   [place], Jarome Matin, RPH-CPP   1 year ago Type 2 diabetes mellitus with other circulatory complications [facility])   [facility] Ladell Pier, MD

## 2021-12-27 ENCOUNTER — Other Ambulatory Visit: Payer: Self-pay | Admitting: Internal Medicine

## 2021-12-27 DIAGNOSIS — E1169 Type 2 diabetes mellitus with other specified complication: Secondary | ICD-10-CM

## 2021-12-27 DIAGNOSIS — I10 Essential (primary) hypertension: Secondary | ICD-10-CM

## 2022-01-17 ENCOUNTER — Other Ambulatory Visit: Payer: Self-pay | Admitting: Internal Medicine

## 2022-01-17 DIAGNOSIS — E1169 Type 2 diabetes mellitus with other specified complication: Secondary | ICD-10-CM

## 2022-01-17 NOTE — Telephone Encounter (Signed)
Patient called, left VM to return the call to the office to scheduled an appt for medication refill request.  Interpreter Elita Quick [phone]

## 2022-01-17 NOTE — Telephone Encounter (Signed)
Requested medication (s) are due for refill today: yes  Requested medication (s) are on the active medication list: yes  Last refill:  09/25/21 #180/0  Future visit scheduled: no  Notes to clinic:  pt is due for updated labs and appt. LVM by interpreter     Requested Prescriptions  Pending Prescriptions Disp Refills   metFORMIN (GLUCOPHAGE) 500 MG tablet [Pharmacy Med Name: METFORMIN HCL 500 MG TABLET] 180 tablet 0    Sig: TOME UNA TABLETA DOS VECES AL DIA CON [place]     Endocrinology:  Diabetes - Biguanides Failed - 01/17/2022  1:54 AM      Failed - HBA1C is between 0 and 7.9 and within 180 days    HbA1c, POC (prediabetic range)  Date Value Ref Range Status  09/08/2018 6.4 5.7 - 6.4 % Final   HbA1c, POC (controlled diabetic range)  Date Value Ref Range Status  03/28/2021 6.5 0.0 - 7.0 % Final         Failed - B12 Level in normal range and within 720 days    Vitamin B-12  Date Value Ref Range Status  01/20/2019 328 232 - 1,245 pg/mL Final         Failed - Valid encounter within last 6 months    Recent Outpatient Visits           6 months ago Type 2 diabetes mellitus with obesity [facility])   [place], [place], [place]   8 months ago Chronic pain of left knee   [place], Cari S, [place]   9 months ago Type 2 diabetes mellitus with obesity ([place])   [place], Deborah B, MD   1 year ago Essential hypertension   [place], Jarome Matin, RPH-CPP   1 year ago Type 2 diabetes mellitus with other circulatory complications [facility])   [place] Ladell Pier, MD              Passed - Cr in normal range and within 360 days    Creatinine, Ser  Date Value Ref Range Status  12/17/2021 0.77 0.44 - 1.00 mg/dL Final         Passed - eGFR in normal range and within 360 days    GFR calc Af  Amer  Date Value Ref Range Status  05/28/2020 101 >59 mL/min/1.73 Final    Comment:    **In accordance with recommendations from the NKF-ASN Task force,**   [facility] is in the process of updating its eGFR calculation to the   2021 CKD-EPI creatinine equation that estimates kidney function   without a race variable.    GFR, Estimated  Date Value Ref Range Status  12/17/2021 >60 >60 mL/min Final    Comment:    (NOTE) Calculated using the CKD-EPI Creatinine Equation (2021)    eGFR  Date Value Ref Range Status  03/28/2021 88 >59 mL/min/1.73 Final         Passed - CBC within normal limits and completed in the last 12 months    WBC  Date Value Ref Range Status  12/17/2021 8.4 4.0 - 10.5 K/uL Final   RBC  Date Value Ref Range Status  12/17/2021 4.53 3.87 - 5.11 MIL/uL Final   Hemoglobin  Date Value Ref Range Status  12/17/2021 13.4 12.0 - 15.0 g/dL Final  03/28/2021 14.2 11.1 -  15.9 g/dL Final   HCT  Date Value Ref Range Status  12/17/2021 41.2 36.0 - 46.0 % Final   Hematocrit  Date Value Ref Range Status  03/28/2021 41.4 34.0 - 46.6 % Final   MCHC  Date Value Ref Range Status  12/17/2021 32.5 30.0 - 36.0 g/dL Final   [facility]  Date Value Ref Range Status  12/17/2021 29.6 26.0 - 34.0 pg Final   MCV  Date Value Ref Range Status  12/17/2021 90.9 80.0 - 100.0 fL Final  03/28/2021 85 79 - 97 fL Final   No results found for: "PLTCOUNTKUC", "LABPLAT", "POCPLA" RDW  Date Value Ref Range Status  12/17/2021 14.5 11.5 - 15.5 % Final  03/28/2021 13.7 11.7 - 15.4 % Final

## 2022-05-14 ENCOUNTER — Other Ambulatory Visit: Payer: Self-pay

## 2022-05-14 NOTE — Progress Notes (Signed)
Contacted patient regarding statin use in patient with diabetes. Atorvastatin 40 was last filled on 08/30/2020, however patient endorses she still has medication with her. Last LDL was 75 on 03/28/2021. Last seen by Dr. Wynetta Emery on 03/28/2022.   Patient requested to schedule a visit with Dr. Wynetta Emery for annual exam/labs. Scheduled patient for next available appointment, 07/11/2022 at 9:10 AM.   Joseph Art, Pharm.D. PGY-2 Ambulatory Care Pharmacy Resident 05/14/2022 11:14 AM

## 2022-06-11 ENCOUNTER — Emergency Department (HOSPITAL_COMMUNITY)
Admission: EM | Admit: 2022-06-11 | Discharge: 2022-06-11 | Disposition: A | Discharge status: Home / Self Care | Payer: Medicare HMO | Attending: Emergency Medicine | Admitting: Emergency Medicine

## 2022-06-11 ENCOUNTER — Emergency Department (HOSPITAL_COMMUNITY): Payer: Medicare HMO

## 2022-06-11 ENCOUNTER — Encounter (HOSPITAL_COMMUNITY): Payer: Self-pay | Admitting: Emergency Medicine

## 2022-06-11 ENCOUNTER — Other Ambulatory Visit: Payer: Self-pay

## 2022-06-11 DIAGNOSIS — Z7984 Long term (current) use of oral hypoglycemic drugs: Secondary | ICD-10-CM | POA: Diagnosis not present

## 2022-06-11 DIAGNOSIS — R062 Wheezing: Secondary | ICD-10-CM | POA: Diagnosis present

## 2022-06-11 DIAGNOSIS — Z7901 Long term (current) use of anticoagulants: Secondary | ICD-10-CM | POA: Diagnosis not present

## 2022-06-11 DIAGNOSIS — R0602 Shortness of breath: Secondary | ICD-10-CM | POA: Diagnosis not present

## 2022-06-11 DIAGNOSIS — J069 Acute upper respiratory infection, unspecified: Secondary | ICD-10-CM | POA: Diagnosis not present

## 2022-06-11 DIAGNOSIS — Z20822 Contact with and (suspected) exposure to covid-19: Secondary | ICD-10-CM | POA: Insufficient documentation

## 2022-06-11 DIAGNOSIS — J40 Bronchitis, not specified as acute or chronic: Secondary | ICD-10-CM | POA: Insufficient documentation

## 2022-06-11 DIAGNOSIS — I4891 Unspecified atrial fibrillation: Secondary | ICD-10-CM | POA: Diagnosis not present

## 2022-06-11 DIAGNOSIS — Z79899 Other long term (current) drug therapy: Secondary | ICD-10-CM | POA: Diagnosis not present

## 2022-06-11 DIAGNOSIS — I10 Essential (primary) hypertension: Secondary | ICD-10-CM | POA: Diagnosis not present

## 2022-06-11 DIAGNOSIS — R059 Cough, unspecified: Secondary | ICD-10-CM | POA: Diagnosis not present

## 2022-06-11 DIAGNOSIS — E119 Type 2 diabetes mellitus without complications: Secondary | ICD-10-CM | POA: Diagnosis not present

## 2022-06-11 DIAGNOSIS — B9789 Other viral agents as the cause of diseases classified elsewhere: Secondary | ICD-10-CM | POA: Diagnosis not present

## 2022-06-11 LAB — COMPREHENSIVE METABOLIC PANEL
ALT: 63 U/L — ABNORMAL HIGH (ref 0–44)
AST: 94 U/L — ABNORMAL HIGH (ref 15–41)
Albumin: 3.7 g/dL (ref 3.5–5.0)
Alkaline Phosphatase: 91 U/L (ref 38–126)
Anion gap: 8 (ref 5–15)
BUN: 7 mg/dL — ABNORMAL LOW (ref 8–23)
CO2: 26 mmol/L (ref 22–32)
Calcium: 9 mg/dL (ref 8.9–10.3)
Chloride: 103 mmol/L (ref 98–111)
Creatinine, Ser: 0.97 mg/dL (ref 0.44–1.00)
GFR, Estimated: >60 mL/min (ref >60)
Glucose, Bld: 147 mg/dL — ABNORMAL HIGH (ref 70–99)
Potassium: 4 mmol/L (ref 3.5–5.1)
Sodium: 137 mmol/L (ref 135–145)
Total Bilirubin: 0.7 mg/dL (ref 0.3–1.2)
Total Protein: 7.7 g/dL (ref 6.5–8.1)

## 2022-06-11 LAB — RESP PANEL BY RT-PCR (FLU A&B, COVID) ARPGX2
Influenza A by PCR: NEGATIVE
Influenza B by PCR: NEGATIVE
SARS Coronavirus 2 by RT PCR: NEGATIVE

## 2022-06-11 LAB — CBC
HCT: 42.8 % (ref 36.0–46.0)
Hemoglobin: 14.1 g/dL (ref 12.0–15.0)
MCH: 30.1 pg (ref 26.0–34.0)
MCHC: 32.9 g/dL (ref 30.0–36.0)
MCV: 91.5 fL (ref 80.0–100.0)
Platelets: 190 10*3/uL (ref 150–400)
RBC: 4.68 MIL/uL (ref 3.87–5.11)
RDW: 13.6 % (ref 11.5–15.5)
WBC: 7.2 10*3/uL (ref 4.0–10.5)
nRBC: 0 % (ref 0.0–0.2)

## 2022-06-11 LAB — BRAIN NATRIURETIC PEPTIDE: B Natriuretic Peptide: 105.8 pg/mL — ABNORMAL HIGH (ref 0.0–100.0)

## 2022-06-11 LAB — TROPONIN I (HIGH SENSITIVITY): Troponin I (High Sensitivity): 8 ng/L (ref <18)

## 2022-06-11 MED ORDER — PREDNISONE 20 MG PO TABS
60.0000 mg | ORAL_TABLET | Freq: Once | ORAL | Status: AC
  Administered 2022-06-11: 60 mg via ORAL
  Filled 2022-06-11: qty 3

## 2022-06-11 MED ORDER — PREDNISONE 10 MG PO TABS: 20.0000 mg | ORAL_TABLET | Freq: Every day | ORAL | 0 refills | Status: AC

## 2022-06-11 MED ORDER — ALBUTEROL SULFATE HFA 108 (90 BASE) MCG/ACT IN AERS
4.0000 | INHALATION_SPRAY | Freq: Once | RESPIRATORY_TRACT | Status: AC
  Administered 2022-06-11: 4 via RESPIRATORY_TRACT
  Filled 2022-06-11: qty 6.7

## 2022-06-11 MED ORDER — ACETAMINOPHEN 500 MG PO TABS
1000.0000 mg | ORAL_TABLET | ORAL | Status: AC
  Administered 2022-06-11: 1000 mg via ORAL
  Filled 2022-06-11: qty 2

## 2022-06-11 MED ORDER — DOXYCYCLINE HYCLATE 100 MG PO TABS
100.0000 mg | ORAL_TABLET | Freq: Once | ORAL | Status: AC
  Administered 2022-06-11: 100 mg via ORAL
  Filled 2022-06-11: qty 1

## 2022-06-11 MED ORDER — DOXYCYCLINE HYCLATE 100 MG PO CAPS: 100.0000 mg | ORAL_CAPSULE | Freq: Two times a day (BID) | ORAL | 0 refills | Status: DC

## 2022-06-11 NOTE — ED Provider Triage Note (Signed)
Emergency Medicine Provider Triage Evaluation Note  Emily Hamilton , a 76 y.o. female  was evaluated in triage.  Pt complains of cough, fatigue and chest tightness for 3 days.   No fevers at home.  Did have flu shot and covid vaccine  Some chest discomfort with coughing.   Review of Systems  Positive: Cough,  Negative: Fever   Physical Exam  BP (!) 151/63   Pulse (!) 103   Temp 99.1 F (37.3 C) (Oral)   Resp (!) 22   Ht 4\' 7"  (1.397 m)   Wt 81 kg   SpO2 96%   BMI 41.50 kg/m  Gen:   Awake, no distress  Resp:  Normal effort  MSK:   Moves extremities without difficulty  Other:  Faint occasional wheeze. Abd soft NTTP.   Medical Decision Making  Medically screening exam initiated at 4:18 PM.  Appropriate orders placed.  Emily Hamilton was informed that the remainder of the evaluation will be completed by another provider, this initial triage assessment does not replace that evaluation, and the importance of remaining in the ED until their evaluation is complete.  Labs, cxr   Achilles Dunk [place], DOLE 06/11/22 1621

## 2022-06-11 NOTE — Discharge Instructions (Signed)
Take next dose antibiotic tomorrow morning.  Take 2 puffs of albuterol inhaler as needed every 4-6 hours.  Take next dose of steroid tomorrow.  Please return if symptoms worsen.

## 2022-06-11 NOTE — ED Provider Notes (Signed)
[facility] EMERGENCY DEPARTMENT Provider Note   CSN: [id number] Arrival date & time: 06/11/22  1555     History  Chief Complaint  Patient presents with   Cough    Emily Hamilton is a 76 y.o. female.  The history is provided by the patient.  Cough Cough characteristics:  Productive Sputum characteristics:  Nondescript Severity:  Moderate Onset quality:  Gradual Duration:  3 days Timing:  Constant Progression:  Unchanged Chronicity:  New Smoker: no   Context: not sick contacts   Relieved by:  Nothing Worsened by:  Nothing Associated symptoms: chest pain, shortness of breath, sinus congestion and wheezing   Associated symptoms: no chills, no diaphoresis, no ear fullness, no ear pain, no eye discharge, no fever, no headaches, no myalgias, no rash, no rhinorrhea, no sore throat and no weight loss        Home Medications Prior to Admission medications   Medication Sig Start Date End Date Taking? Authorizing Provider  doxycycline (VIBRAMYCIN) 100 MG capsule Take 1 capsule (100 mg total) by mouth 2 (two) times daily. 06/11/22  Yes Lotoya Casella, DO  predniSONE (DELTASONE) 10 MG tablet Take 2 tablets (20 mg total) by mouth daily for 5 days. 06/11/22 06/16/22 Yes Kimbra Marcelino, DO  amLODipine (NORVASC) 5 MG tablet TAKE 1 TABLET (5 MG TOTAL) BY MOUTH DAILY. 09/25/21   Marcine Matar, MD  atorvastatin (LIPITOR) 40 MG tablet TOME UNA TABLETA TODOS LOS DIAS 05/28/20   Fulp, Cammie, MD  famotidine (PEPCID) 20 MG tablet Take 1 tablet (20 mg total) by mouth 2 (two) times daily. 07/18/21   Anders Simmonds, PA-C  hydrochlorothiazide (MICROZIDE) 12.5 MG capsule TAKE 1 CAPSULE (12.5 MG TOTAL) BY MOUTH DAILY. TOME UNA CAPSULA TODOS LOS DIAS 12/24/21   Marcine Matar, MD  isosorbide mononitrate (IMDUR) 30 MG 24 hr tablet TAKE 1/2 OF A TABLET (15 MG TOTAL) POR VIA ORAL A DIARIO 09/25/21   Marcine Matar, MD  metFORMIN (GLUCOPHAGE) 500 MG tablet TAKE 1  TABLET BY MOUTH 2 TIMES DAILY WITH A MEAL. 09/25/21   Marcine Matar, MD  methocarbamol (ROBAXIN) 500 MG tablet Take 1 tablet (500 mg total) by mouth at bedtime as needed for muscle spasms. Patient not taking: Reported on 07/18/2021 06/25/21   Caccavale, Sophia, PA-C  mupirocin ointment (BACTROBAN) 2 % Apply 1 application topically 2 (two) times daily. 07/18/21   Anders Simmonds, PA-C  pantoprazole (PROTONIX) 20 MG tablet Take 1 tablet (20 mg total) by mouth daily. 12/17/21 01/16/22  Blue, Soijett A, PA-C  XARELTO 20 MG TABS tablet TAKE 1 TABLET BY MOUTH DAILY WITH SUPPER. 12/20/21   Marcine Matar, MD      Allergies    Lisinopril    Review of Systems   Review of Systems  Constitutional:  Negative for chills, diaphoresis, fever and weight loss.  HENT:  Negative for ear pain, rhinorrhea and sore throat.   Eyes:  Negative for discharge.  Respiratory:  Positive for cough, shortness of breath and wheezing.   Cardiovascular:  Positive for chest pain.  Musculoskeletal:  Negative for myalgias.  Skin:  Negative for rash.  Neurological:  Negative for headaches.    Physical Exam Updated Vital Signs BP 111/73   Pulse 87   Temp 98.2 F (36.8 C) (Oral)   Resp 20   Ht 4\' 7"  (1.397 m)   Wt 81 kg   SpO2 98%   BMI 41.50 kg/m  Physical Exam  Vitals and nursing note reviewed.  Constitutional:      General: She is not in acute distress.    Appearance: She is well-developed. She is not ill-appearing.  HENT:     Head: Normocephalic and atraumatic.     Mouth/Throat:     Mouth: Mucous membranes are moist.  Eyes:     Extraocular Movements: Extraocular movements intact.     Conjunctiva/sclera: Conjunctivae normal.     Pupils: Pupils are equal, round, and reactive to light.  Cardiovascular:     Rate and Rhythm: Normal rate and regular rhythm.     Pulses: Normal pulses.     Heart sounds: Normal heart sounds. No murmur heard. Pulmonary:     Effort: Pulmonary effort is normal. No respiratory  distress.     Breath sounds: Wheezing present.  Abdominal:     Palpations: Abdomen is soft.     Tenderness: There is no abdominal tenderness.  Musculoskeletal:        General: No swelling.     Cervical back: Normal range of motion and neck supple.     Right lower leg: No edema.     Left lower leg: No edema.  Skin:    General: Skin is warm and dry.     Capillary Refill: Capillary refill takes less than 2 seconds.  Neurological:     General: No focal deficit present.     Mental Status: She is alert.  Psychiatric:        Mood and Affect: Mood normal.     ED Results / Procedures / Treatments   Labs (all labs ordered are listed, but only abnormal results are displayed) Labs Reviewed  COMPREHENSIVE METABOLIC PANEL - Abnormal; Notable for the following components:      Result Value   Glucose, Bld 147 (*)    BUN 7 (*)    AST 94 (*)    ALT 63 (*)    All other components within normal limits  BRAIN NATRIURETIC PEPTIDE - Abnormal; Notable for the following components:   B Natriuretic Peptide 105.8 (*)    All other components within normal limits  RESP PANEL BY RT-PCR (FLU A&B, COVID) ARPGX2  CBC  TROPONIN I (HIGH SENSITIVITY)    EKG EKG Interpretation  Date/Time:  Wednesday June 11 2022 16:10:43 EST Ventricular Rate:  111 PR Interval:    QRS Duration: 66 QT Interval:  342 QTC Calculation: 465 R Axis:   55 Text Interpretation: Atrial fibrillation with rapid ventricular response Nonspecific ST and T wave abnormality Abnormal ECG When compared with ECG of 17-Dec-2021 15:17, PREVIOUS ECG IS PRESENT Confirmed by Virgina Norfolk ([id number]) on 06/11/2022 6:24:29 PM  Radiology DG Chest 2 View  Result Date: 06/11/2022 CLINICAL DATA:  Cough and shortness of breath. EXAM: CHEST - 2 VIEW COMPARISON:  None Available. FINDINGS: Examination is degraded due to patient body habitus and large field view. Enlarged cardiac silhouette and mediastinal contours. The pulmonary vasculature is  indistinct with cephalization of flow. Right mid lung linear heterogeneous opacities favored to represent atelectasis or scarring. No pleural effusion or pneumothorax. No definite acute osseous abnormalities. Accentuated kyphosis at thoracolumbar junction. Stigmata of DISH within the thoracic spine. Mild to moderate multilevel lumbar spine DDD is suspected incompletely evaluated. Postcholecystectomy. IMPRESSION: Cardiomegaly with suspected pulmonary edema and right mid lung atelectasis/scarring. Electronically Signed   By: Simonne Come M.D.   On: 06/11/2022 16:56    Procedures Procedures    Medications Ordered in ED Medications  acetaminophen (TYLENOL) tablet  1,000 mg (1,000 mg Oral Given 06/11/22 1622)  predniSONE (DELTASONE) tablet 60 mg (60 mg Oral Given 06/11/22 1943)  doxycycline (VIBRA-TABS) tablet 100 mg (100 mg Oral Given 06/11/22 1943)  albuterol (VENTOLIN HFA) 108 (90 Base) MCG/ACT inhaler 4 puff (4 puffs Inhalation Given 06/11/22 1943)    ED Course/ Medical Decision Making/ A&P                           Medical Decision Making Risk Prescription drug management.   Kelis Plasse is here with cough.  Normal vitals.  No fever.  Symptoms for the last 3 days.  History of high cholesterol, diabetes, hypertension.  She is got a little bit of wheezing and cough on exam.  No signs of volume overload on exam.  Only chest pain when she coughs.  EKG per my review and interpretation shows controlled atrial fibrillation when she has a history of.  She is on Xarelto.  Overall she is well-appearing.  No signs of respiratory distress.  Differential diagnosis likely pneumonia/bronchitis/viral process versus less likely ACS, heart failure.  Will check CBC, BMP, BNP, troponin, chest x-ray, viral testing.  Overall chest x-ray per my review may be consistent with bronchitis/pneumonia.  I do not think this is clinically heart failure.  BNP was unremarkable.  Troponin unremarkable.  No significant  anemia, electrolyte abnormality, leukocytosis per my review interpretation of labs.  COVID and flu test negative.  Overall suspect a bronchitis.  Will treat with prednisone, doxycycline, albuterol.  Understands return precautions.  Discharged in good condition.  This chart was dictated using voice recognition software.  Despite best efforts to proofread,  errors can occur which can change the documentation meaning.         Final Clinical Impression(s) / ED Diagnoses Final diagnoses:  Viral URI with cough  Bronchitis    Rx / DC Orders ED Discharge Orders          Ordered    doxycycline (VIBRAMYCIN) 100 MG capsule  2 times daily        06/11/22 2044    predniSONE (DELTASONE) 10 MG tablet  Daily        06/11/22 2044              Virgina Norfolk, DO 06/11/22 2045

## 2022-06-11 NOTE — ED Triage Notes (Signed)
Using interpreter, pt endorses cough and chest pain for 3 days ago. Pt denies n/v/d. Pt audibly wheezing.

## 2022-06-16 ENCOUNTER — Telehealth: Payer: Self-pay

## 2022-06-16 NOTE — Patient Outreach (Signed)
  Care Coordination [facility] Note Transition Care Management Unsuccessful Follow-up Telephone Call  Date of discharge and from where:  [facility]  Attempts:  1st Attempt  Reason for unsuccessful TCM follow-up call:  No answer/busy    Antionette Fairy, RN,BSN,CCM [facility] Care Management Telephonic Care Management Coordinator Direct Phone: [phone] Toll Free: [phone] Fax: [phone]

## 2022-06-17 ENCOUNTER — Telehealth: Payer: Self-pay

## 2022-06-17 NOTE — Patient Outreach (Signed)
  Care Coordination [facility] Note Transition Care Management Follow-up Telephone Call Date of discharge and from where: 06/11/22- [place] [facility] EMMI-ED Discharge Alert Reason: "Scheduled a follow up? No" Red Alert Date: 06/13/22 How have you been since you were released from the hospital? Call completed using Spanish Interpreter([employer] ID # [phone]) Patient states she is feeling a little better. Coughing has improved. She is taking meds as ordered.  Any questions or concerns? Yes  Items Reviewed: Did the pt receive and understand the discharge instructions provided? Yes  Medications obtained and verified? Yes  Other? Yes  Any new allergies since your discharge? No  Dietary orders reviewed? Yes Do you have support at home? Yes   Home Care and Equipment/Supplies: Were home health services ordered? not applicable If so, what is the name of the agency? N/A  Has the agency set up a time to come to the patient's home? not applicable Were any new equipment or medical supplies ordered?  No What is the name of the medical supply agency? N/A Were you able to get the supplies/equipment? not applicable Do you have any questions related to the use of the equipment or supplies? No  Functional Questionnaire: (I = Independent and D = Dependent) ADLs: I  Bathing/Dressing- I  Meal Prep- I  Eating- I  Maintaining continence- I  Transferring/Ambulation- I  Managing Meds- I  Follow up appointments reviewed:  PCP Hospital f/u appt confirmed? Yes  Scheduled to see PCP on 07/11/22 @ 9:10am. Specialist Hospital f/u appt confirmed? No  . Are transportation arrangements needed? No  If their condition worsens, is the pt aware to call PCP or go to the Emergency Dept.? Yes Was the patient provided with contact information for the PCP's office or ED? Yes Was to pt encouraged to call back with questions or concerns? Yes  SDOH assessments and interventions completed:   Yes   Care Coordination  Interventions:  Education provided    Encounter Outcome:  Pt. Visit Completed    Antionette Fairy, RN,BSN,CCM [facility] Care Management Telephonic Care Management Coordinator Direct Phone: [phone] Toll Free: [phone] Fax: [phone]

## 2022-07-09 ENCOUNTER — Encounter: Payer: Self-pay | Admitting: Pharmacist

## 2022-07-09 NOTE — Progress Notes (Signed)
[facility])                                            [facility] Quality Pharmacy Team                                        Statin Quality Measure Assessment    07/09/2022  [name] 1946/04/09 [id number]  I am a [facility] clinical pharmacist that reviews patients for statin quality initiatives.     Per review of chart and payor information, [name] has a diagnosis of diabetes but is not currently filling a statin prescription.  This places her into the SUPD (Statin Use In Patients with Diabetes) measure for CMS.    [name] has been prescribed in the past but she has not filled since February 2022.   Please consider evaluating her for statin therapy or code for any statin intolerance at her office visit on December 29th if clinically appropriate.  Her calculated 10-year ASCVD risk score (Arnett DK, et al., 2019) is: 28.5%.   Please consider ONE of the following recommendations:  Initiate high intensity statin Atorvastatin 40mg  once daily, #90, 3 refills   Rosuvastatin 20mg  once daily, #90, 3 refills    Initiate moderate intensity          statin with reduced frequency if prior          statin intolerance 1x weekly, #13, 3 refills   2x weekly, #26, 3 refills   3x weekly, #39, 3 refills    Code for past statin intolerance or  other exclusions (required annually)  Provider Requirements: Associate code during an office visit or telehealth encounter  Drug Induced Myopathy G72.0   Myopathy, unspecified G72.9   Myositis, unspecified M60.9   Rhabdomyolysis M62.82   Alcoholic fatty liver K70.0   Cirrhosis of liver K74.69   Prediabetes R73.03   Thank you for your time and consideration, , PharmD [facility]  [facility] Clinical Pharmacist Office: [phone]

## 2022-07-11 ENCOUNTER — Ambulatory Visit
Admission: RE | Admit: 2022-07-11 | Discharge: 2022-07-11 | Disposition: A | Discharge status: Home / Self Care | Payer: Medicare HMO | Source: Ambulatory Visit | Attending: Internal Medicine | Admitting: Internal Medicine

## 2022-07-11 ENCOUNTER — Ambulatory Visit: Payer: Medicare HMO | Attending: Internal Medicine | Admitting: Internal Medicine

## 2022-07-11 DIAGNOSIS — R7989 Other specified abnormal findings of blood chemistry: Secondary | ICD-10-CM

## 2022-07-11 DIAGNOSIS — I152 Hypertension secondary to endocrine disorders: Secondary | ICD-10-CM | POA: Diagnosis not present

## 2022-07-11 DIAGNOSIS — I4821 Permanent atrial fibrillation: Secondary | ICD-10-CM

## 2022-07-11 DIAGNOSIS — R1013 Epigastric pain: Secondary | ICD-10-CM | POA: Diagnosis not present

## 2022-07-11 DIAGNOSIS — E1169 Type 2 diabetes mellitus with other specified complication: Secondary | ICD-10-CM | POA: Diagnosis not present

## 2022-07-11 DIAGNOSIS — Z1231 Encounter for screening mammogram for malignant neoplasm of breast: Secondary | ICD-10-CM

## 2022-07-11 DIAGNOSIS — R0609 Other forms of dyspnea: Secondary | ICD-10-CM

## 2022-07-11 DIAGNOSIS — R5383 Other fatigue: Secondary | ICD-10-CM | POA: Diagnosis not present

## 2022-07-11 DIAGNOSIS — Z23 Encounter for immunization: Secondary | ICD-10-CM

## 2022-07-11 DIAGNOSIS — E785 Hyperlipidemia, unspecified: Secondary | ICD-10-CM

## 2022-07-11 DIAGNOSIS — Z6841 Body Mass Index (BMI) 40.0 and over, adult: Secondary | ICD-10-CM

## 2022-07-11 DIAGNOSIS — E1159 Type 2 diabetes mellitus with other circulatory complications: Secondary | ICD-10-CM

## 2022-07-11 LAB — POCT GLYCOSYLATED HEMOGLOBIN (HGB A1C): HbA1c, POC (controlled diabetic range): 6.3 % (ref 0.0–7.0)

## 2022-07-11 LAB — GLUCOSE, POCT (MANUAL RESULT ENTRY): POC Glucose: 144 mg/dl — AB (ref 70–99)

## 2022-07-11 MED ORDER — ISOSORBIDE MONONITRATE ER 30 MG PO TB24: ORAL_TABLET | ORAL | 1 refills | Status: DC

## 2022-07-11 MED ORDER — AMLODIPINE BESYLATE 5 MG PO TABS: 5.0000 mg | ORAL_TABLET | Freq: Every day | ORAL | 1 refills | Status: DC

## 2022-07-11 MED ORDER — RIVAROXABAN 20 MG PO TABS: 20.0000 mg | ORAL_TABLET | Freq: Every day | ORAL | 1 refills | Status: DC

## 2022-07-11 MED ORDER — ATORVASTATIN CALCIUM 40 MG PO TABS: ORAL_TABLET | ORAL | 1 refills | Status: DC

## 2022-07-11 MED ORDER — PANTOPRAZOLE SODIUM 20 MG PO TBEC: 20.0000 mg | DELAYED_RELEASE_TABLET | Freq: Every day | ORAL | 3 refills | Status: DC

## 2022-07-11 MED ORDER — METFORMIN HCL 500 MG PO TABS: 500.0000 mg | ORAL_TABLET | Freq: Two times a day (BID) | ORAL | 1 refills | Status: DC

## 2022-07-11 MED ORDER — HYDROCHLOROTHIAZIDE 12.5 MG PO CAPS: 12.5000 mg | ORAL_CAPSULE | Freq: Every day | ORAL | 1 refills | Status: DC

## 2022-07-11 NOTE — Progress Notes (Signed)
Patient ID: Emily Hamilton, female    DOB: 1945/11/13  MRN: [id number]  CC: Diabetes (DM f/u. Nicki Reaper feelings of stomach inflammation. /Provider in [place] informed pt that there is fluid in her lungs - confirmed through x-rays. Currently taking meds.)   Subjective: Emily Hamilton is a 76 y.o. female who presents for chronic ds management.  I last saw her 03/2021 Her concerns today include:  Patient with history of DM type II, HL, HTN, osteopenia, permanent atrial fibrillation, Vit D def   Did not bring meds but has pics of bottles  on phone that she showed to me Of note she has pic of Omeprazole 20 mg and Pantoprazole 20 mg daily.  Pt tells me she is only taking the Pantoprazole.   Still having issues of feeling stomach is inflamed which she describes as a bloating feeling all the time.  No burning sensation in the stomach. No burning in chest or throat. Mouth feels bitter in mornings.  No worse or better with foods.  No abdominal pain with meals.   Out of Pantoprazole x 8 days.  Does not feel that the med makes a difference + for H.pylori last yr.  Treated.  Seen by [facility] earlier this yr and had repeat test with stool sample. I am not sure of the results of that repeat test.  Endorses having EGD by them about 5 mths ago.  Told she has gastritis.  She needs to schedule a f/u  DM:   Results for orders placed or performed in visit on 07/11/22  POCT glucose (manual entry)  Result Value Ref Range   POC Glucose 144 (A) 70 - 99 mg/dl  POCT glycosylated hemoglobin (Hb A1C)  Result Value Ref Range   Hemoglobin A1C     HbA1c POC (<> result, manual entry)     HbA1c, POC (prediabetic range)     HbA1c, POC (controlled diabetic range) 6.3 0.0 - 7.0 %  Reports compliance with Metformin 500 mg BID.  Endorses diarrhea sometimes but not enough to be bothersome Does not check BS Does well with eating habits. Walks 3x/wk for exercise  HTN: Did not take meds as yet for  today On Norvasc 5mg , HCTZ 12.5 and Isosorbide. Does not check BP Limits salt in foods Reports being told that she has fluid in the lungs by doctor in 12/2021.  Not hosp but reports being given a med.  She is no longer on the med and does not recall the name. No LE edema, PND, orthopnea at this time (sleeps on 1 pillow).  No SOB when she is  Up moving around but does feel tired with regular activities.  No CP.  Recent CBC revealed hemoglobin of 14.1.  Had negative [place] perfusion study 08/2019.  Echo done 08/2019 revealed normal LV function but diastolic parameters were indeterminate. Had chest x-ray done about a month ago through urgent care for cough.  This review no cardiomegaly with suspected pulmonary edema and right middle lobe atelectasis/scarring  HL: compliant with Lipitor 40 mg Had LFTs checked last mth through UC - levels were 94/63  A.fib:  still taking Xarelto No bruising or bleeding on med.  No falls  Patient Active Problem List   Diagnosis Date Noted   Chronic pain of left knee 04/24/2021   Class 3 severe obesity due to excess calories with serious comorbidity and body mass index (BMI) of 45.0 to 49.9 in adult [facility]) 04/23/2021  Atrial fibrillation (HCC) 10/13/2020   Hyperlipidemia LDL goal <70 10/18/2018   Osteopenia 10/18/2018   Type 2 diabetes mellitus without complication, without long-term current use of insulin (HCC) 09/08/2018     No current outpatient medications on file prior to visit.   No current facility-administered medications on file prior to visit.    Allergies  Allergen Reactions   Lisinopril Other (See Comments)    angioedema    Social History   Socioeconomic History   Marital status: Married    Spouse name: Not on file   Number of children: Not on file   Years of education: Not on file   Highest education level: Not on file  Occupational History   Not on file  Tobacco Use   Smoking status: Never   Smokeless tobacco: Never   Vaping Use   Vaping Use: Never used  Substance and Sexual Activity   Alcohol use: Never   Drug use: Never   Sexual activity: Never  Other Topics Concern   Not on file  Social History Narrative   Not on file   Social Determinants of Health   Financial Resource Strain: Low Risk  (03/30/2021)   Overall Financial Resource Strain (CARDIA)    Difficulty of Paying Living Expenses: Not hard at all  Food Insecurity: No Food Insecurity (06/17/2022)   Hunger Vital Sign    Worried About Running Out of Food in the Last Year: Never true    Ran Out of Food in the Last Year: Never true  Transportation Needs: No Transportation Needs (06/17/2022)   PRAPARE - Administrator, Civil Service (Medical): No    Lack of Transportation (Non-Medical): No  Physical Activity: Insufficiently Active (03/30/2021)   Exercise Vital Sign    Days of Exercise per Week: 3 days    Minutes of Exercise per Session: 20 min  Stress: No Stress Concern Present (03/30/2021)   [employer] of Occupational Health - Occupational Stress Questionnaire    Feeling of Stress : Not at all  Social Connections: Socially Isolated (03/30/2021)   Social Connection and Isolation Panel [NHANES]    Frequency of Communication with Friends and Family: Once a week    Frequency of Social Gatherings with Friends and Family: Once a week    Attends Religious Services: Never    Database administrator or Organizations: No    Attends Banker Meetings: Never    Marital Status: Separated  Intimate Partner Violence: Not At Risk (03/30/2021)   Humiliation, Afraid, Rape, and Kick questionnaire    Fear of Current or Ex-Partner: No    Emotionally Abused: No    Physically Abused: No    Sexually Abused: No    Family History  Problem Relation Age of Onset   Diabetes Mother    Glaucoma Mother     Past Surgical History:  Procedure Laterality Date   BREAST EXCISIONAL BIOPSY Left    CHOLECYSTECTOMY     Right knee  replacement      ROS: Review of Systems Negative except as stated above  PHYSICAL EXAM: BP 138/81 (BP Location: Left Arm, Patient Position: Sitting, Cuff Size: Large)   Pulse 95   Temp 98 F (36.7 C) (Oral)   Ht 4\' 7"  (1.397 m)   Wt 175 lb (79.4 kg)   SpO2 97%   BMI 40.67 kg/m   Physical Exam  General appearance - alert, well appearing, and in no distress Mental status - normal mood, behavior, speech, dress,  motor activity, and thought processes Neck - supple, no significant adenopathy Chest - clear to auscultation, no wheezes, rales or rhonchi, symmetric air entry Heart -heart rhythm is irregularly irregular but rate controlled Extremities -no lower extremity edema. Diabetic Foot Exam - Simple   Simple Foot Form Diabetic Foot exam was performed with the following findings: Yes 07/11/2022 12:47 PM  Visual Inspection No deformities, no ulcerations, no other skin breakdown bilaterally: Yes Sensation Testing Intact to touch and monofilament testing bilaterally: Yes Pulse Check Posterior Tibialis and Dorsalis pulse intact bilaterally: Yes Comments Varicose veins noted over the dorsal surface of both feet.         Latest Ref Rng & Units 06/11/2022    4:30 PM 12/17/2021    9:34 AM 06/25/2021    4:31 PM  CMP  Glucose 70 - 99 mg/dL 161  096  045   BUN 8 - 23 mg/dL Creatinine 0.44 - 1.00 mg/dL 4.09  8.11  9.14   Sodium 135 - 145 mmol/L 137  139  136   Potassium 3.5 - 5.1 mmol/L 4.0  3.8  4.0   Chloride 98 - 111 mmol/L 103  105  105   CO2 22 - 32 mmol/L Calcium 8.9 - 10.3 mg/dL 9.0  9.2  8.7   Total Protein 6.5 - 8.1 g/dL 7.7  8.0  7.5   Total Bilirubin 0.3 - 1.2 mg/dL 0.7  0.8  0.8   Alkaline Phos 38 - 126 U/L 91  91  94   AST 15 - 41 U/L 94  42  36   ALT 0 - 44 U/L 63  34  22    Lipid Panel     Component Value Date/Time   CHOL 155 03/28/2021 1020   TRIG 185 (H) 03/28/2021 1020   HDL 49 03/28/2021 1020   CHOLHDL 3.2 03/28/2021 1020    LDLCALC 75 03/28/2021 1020    CBC    Component Value Date/Time   WBC 7.2 06/11/2022 1630   RBC 4.68 06/11/2022 1630   HGB 14.1 06/11/2022 1630   HGB 14.2 03/28/2021 1020   HCT 42.8 06/11/2022 1630   HCT 41.4 03/28/2021 1020   PLT 190 06/11/2022 1630   PLT 214 03/28/2021 1020   MCV 91.5 06/11/2022 1630   MCV 85 03/28/2021 1020   MCH 30.1 06/11/2022 1630   MCHC 32.9 06/11/2022 1630   RDW 13.6 06/11/2022 1630   RDW 13.7 03/28/2021 1020   LYMPHSABS 1.7 12/17/2021 0934   LYMPHSABS 1.8 09/08/2018 1100   MONOABS 0.4 12/17/2021 0934   EOSABS 0.2 12/17/2021 0934   EOSABS 0.1 09/08/2018 1100   BASOSABS 0.0 12/17/2021 0934   BASOSABS 0.0 09/08/2018 1100    ASSESSMENT AND PLAN: 1. Type 2 diabetes mellitus with morbid obesity (HCC) At goal. Continue metformin. Commended her on trying to eat healthy.  Dietary counseling given. - POCT glucose (manual entry) - POCT glycosylated hemoglobin (Hb A1C) - Ambulatory referral to Ophthalmology - metFORMIN (GLUCOPHAGE) 500 MG tablet; Take 1 tablet (500 mg total) by mouth 2 (two) times daily with a meal.  Dispense: 180 tablet; Refill: 1 - Microalbumin / creatinine urine ratio  2. Hypertension associated with type 2 diabetes mellitus (HCC) Not at goal but patient has not taken her medicines as yet for the morning.  She will do so when she returns home.  Refill sent on her medications which are Norvasc, HCTZ and  isosorbide - amLODipine (NORVASC) 5 MG tablet; Take 1 tablet (5 mg total) by mouth daily.  Dispense: 90 tablet; Refill: 1 - hydrochlorothiazide (MICROZIDE) 12.5 MG capsule; Take 1 capsule (12.5 mg total) by mouth daily. TOME UNA CAPSULA TODOS LOS DIAS  Dispense: 90 capsule; Refill: 1 - isosorbide mononitrate (IMDUR) 30 MG 24 hr tablet; TAKE 1/2 OF A TABLET (15 MG TOTAL) POR VIA ORAL A DIARIO  Dispense: 45 tablet; Refill: 1  3. Abnormal LFTs Likely due to fatty liver versus effects from atorvastatin or both.  I sent a refill on the  atorvastatin but I told her to hold off on taking it until she hears back from me with the results of lab studies that were being done today. - Hepatitis C Antibody - Hepatitis B surface antigen - Hepatitis B core antibody, total - Hepatitis B surface antibody,quantitative - Hepatic Function Panel  4. Hyperlipidemia associated with type 2 diabetes mellitus (HCC) See #3 above. - Lipid panel - atorvastatin (LIPITOR) 40 MG tablet; TOME UNA TABLETA TODOS LOS DIAS  Dispense: 90 tablet; Refill: 1  5. Permanent atrial fibrillation (HCC) She is tolerating Xarelto without any bleeding episodes. - rivaroxaban (XARELTO) 20 MG TABS tablet; Take 1 tablet (20 mg total) by mouth daily with supper.  Dispense: 90 tablet; Refill: 1  6. DOE (dyspnea on exertion) Lungs today are clear.  Patient describes tiredness with activity.  We will check a BNP and repeat chest x-ray today.  We may get an echo depending on the results of the studies. - Brain natriuretic peptide - DG Chest 2 View; Future  7. Dyspepsia Refill given on Protonix.  Advised and recommend a follow-up visit with her gastroenterologist.  If all else fails, we can try stopping metformin and putting her on a different oral diabetic medication to see if the metformin is contributing. - pantoprazole (PROTONIX) 20 MG tablet; Take 1 tablet (20 mg total) by mouth daily.  Dispense: 30 tablet; Refill: 3  8. Encounter for screening mammogram for malignant neoplasm of breast - MM Digital Screening; Future  9. Need for influenza vaccination - Flu Vaccine QUAD High Dose(Fluad)  10. Need for vaccination against Streptococcus pneumoniae - PNEUMOCOCCAL CONJUGATE VACCINE 15-VALENT   [employer] Language interpreter used during this encounter. #[id number], Lanetta Inch Jesus  Patient was given the opportunity to ask questions.  Patient verbalized understanding of the plan and was able to repeat key elements of the plan.   This documentation was completed using Dietitian.  Any transcriptional errors are unintentional.  Orders Placed This Encounter  Procedures   MM Digital Screening   DG Chest 2 View   PNEUMOCOCCAL CONJUGATE VACCINE 15-VALENT   Flu Vaccine QUAD High Dose(Fluad)   Lipid panel   Hepatitis C Antibody   Hepatitis B surface antigen   Hepatitis B core antibody, total   Hepatitis B surface antibody,quantitative   Hepatic Function Panel   Brain natriuretic peptide   Microalbumin / creatinine urine ratio   Ambulatory referral to Ophthalmology   POCT glucose (manual entry)   POCT glycosylated hemoglobin (Hb A1C)     Requested Prescriptions   Signed Prescriptions Disp Refills   amLODipine (NORVASC) 5 MG tablet 90 tablet 1    Sig: Take 1 tablet (5 mg total) by mouth daily.   atorvastatin (LIPITOR) 40 MG tablet 90 tablet 1    Sig: TOME UNA TABLETA TODOS LOS DIAS   hydrochlorothiazide (MICROZIDE) 12.5 MG capsule 90 capsule 1    Sig:  Take 1 capsule (12.5 mg total) by mouth daily. TOME UNA CAPSULA TODOS LOS DIAS   isosorbide mononitrate (IMDUR) 30 MG 24 hr tablet 45 tablet 1    Sig: TAKE 1/2 OF A TABLET (15 MG TOTAL) POR VIA ORAL A DIARIO   metFORMIN (GLUCOPHAGE) 500 MG tablet 180 tablet 1    Sig: Take 1 tablet (500 mg total) by mouth 2 (two) times daily with a meal.   pantoprazole (PROTONIX) 20 MG tablet 30 tablet 3    Sig: Take 1 tablet (20 mg total) by mouth daily.   rivaroxaban (XARELTO) 20 MG TABS tablet 90 tablet 1    Sig: Take 1 tablet (20 mg total) by mouth daily with supper.    Return in about 4 months (around 11/10/2022) for Please give telephone appt with CMA in 1-3 wks for Medicare Wellness Visit. Jonah Blue.  Cadan Maggart, MD, FACP

## 2022-07-12 ENCOUNTER — Other Ambulatory Visit: Payer: Self-pay | Admitting: Internal Medicine

## 2022-07-12 DIAGNOSIS — R7989 Other specified abnormal findings of blood chemistry: Secondary | ICD-10-CM

## 2022-07-12 LAB — HEPATITIS B CORE ANTIBODY, TOTAL: Hep B Core Total Ab: NEGATIVE

## 2022-07-12 LAB — LIPID PANEL
Chol/HDL Ratio: 3.7 ratio (ref 0.0–4.4)
Cholesterol, Total: 177 mg/dL (ref 100–199)
HDL: 48 mg/dL (ref >39)
LDL Chol Calc (NIH): 97 mg/dL (ref 0–99)
Triglycerides: 189 mg/dL — ABNORMAL HIGH (ref 0–149)
VLDL Cholesterol Cal: 32 mg/dL (ref 5–40)

## 2022-07-12 LAB — HEPATIC FUNCTION PANEL
ALT: 33 IU/L — ABNORMAL HIGH (ref 0–32)
AST: 53 IU/L — ABNORMAL HIGH (ref 0–40)
Albumin: 4.1 g/dL (ref 3.8–4.8)
Alkaline Phosphatase: 112 IU/L (ref 44–121)
Bilirubin Total: 0.4 mg/dL (ref 0.0–1.2)
Bilirubin, Direct: 0.14 mg/dL (ref 0.00–0.40)
Total Protein: 7.8 g/dL (ref 6.0–8.5)

## 2022-07-12 LAB — HEPATITIS C ANTIBODY: Hep C Virus Ab: NONREACTIVE

## 2022-07-12 LAB — MICROALBUMIN / CREATININE URINE RATIO
Creatinine, Urine: 135.7 mg/dL
Microalb/Creat Ratio: 21 mg/g creat (ref 0–29)
Microalbumin, Urine: 28.4 ug/mL

## 2022-07-12 LAB — HEPATITIS B SURFACE ANTIBODY, QUANTITATIVE: Hepatitis B Surf Ab Quant: 27.8 m[IU]/mL (ref >9.9)

## 2022-07-12 LAB — BRAIN NATRIURETIC PEPTIDE: BNP: 114.8 pg/mL — ABNORMAL HIGH (ref 0.0–100.0)

## 2022-07-12 LAB — HEPATITIS B SURFACE ANTIGEN: Hepatitis B Surface Ag: NEGATIVE

## 2022-07-21 ENCOUNTER — Telehealth: Payer: Self-pay | Admitting: Internal Medicine

## 2022-07-21 DIAGNOSIS — I4811 Longstanding persistent atrial fibrillation: Secondary | ICD-10-CM

## 2022-07-21 NOTE — Telephone Encounter (Signed)
Copied from [place] [id number]. Topic: Appointment Scheduling - Scheduling Inquiry for Clinic >> Jul 21, 2022  2:53 PM [name] wrote: Reason for CRM: The patient would like to be contacted regarding their lab appt scheduled for 07/30/23  Please contact further when possible

## 2022-07-23 NOTE — Telephone Encounter (Signed)
Referral submitted to urology per patient's request.

## 2022-07-23 NOTE — Addendum Note (Signed)
Addended by: Karle Plumber B on: 07/23/2022 04:48 PM   Modules accepted: Orders

## 2022-08-01 ENCOUNTER — Ambulatory Visit: Payer: Medicare HMO | Attending: Internal Medicine

## 2022-08-01 DIAGNOSIS — Z0000 Encounter for general adult medical examination without abnormal findings: Secondary | ICD-10-CM | POA: Diagnosis not present

## 2022-08-01 MED ORDER — ZOSTER VAC RECOMB ADJUVANTED 50 MCG/0.5ML IM SUSR: 0.5000 mL | Freq: Once | INTRAMUSCULAR | 1 refills | Status: AC

## 2022-08-01 NOTE — Patient Instructions (Addendum)
[name] , Thank you for taking time to come for your Medicare Wellness Visit. I appreciate your ongoing commitment to your health goals. Please review the following plan we discussed and let me know if I can assist you in the future.   These are the goals we discussed:  Goals   None     This is a list of the screening recommended for you and due dates:  Health Maintenance  Topic Date Due   Zoster (Shingles) Vaccine (1 of 2) Never done   COVID-19 Vaccine (3 - Pfizer risk series) 02/08/2020   Medicare Annual Wellness Visit  03/30/2022   Mammogram  04/13/2022   Eye exam for diabetics  06/28/2022   Hemoglobin A1C  01/10/2023   Yearly kidney function blood test for diabetes  06/12/2023   Yearly kidney health urinalysis for diabetes  07/12/2023   Complete foot exam   07/12/2023   Colon Cancer Screening  05/20/2028   DTaP/Tdap/Td vaccine (2 - Td or Tdap) 10/26/2030   Pneumonia Vaccine  Completed   Flu Shot  Completed   DEXA scan (bone density measurement)  Completed   Hepatitis C Screening: USPSTF Recommendation to screen - Ages 57-79 yo.  Completed   HPV Vaccine  Aged Out  Mantenimiento de la salud despus de los 62 aos de edad Health Maintenance After Age 104 Despus de los 65 aos de edad, corre un riesgo mayor de Tourist information centre manager enfermedades e infecciones a Barrister's clerk, como tambin de sufrir lesiones por cadas. Las cadas son la causa principal de las fracturas de huesos y lesiones en la cabeza de personas mayores de 4 aos de edad. Recibir cuidados preventivos de forma regular puede ayudarlo a mantenerse saludable y en buen [place]. Los cuidados preventivos incluyen realizarse anlisis de forma regular y Actor en el estilo de vida segn las recomendaciones del mdico. Converse con el mdico sobre lo siguiente: Las pruebas de deteccin y los anlisis que debe Dispensing optician. Una prueba de deteccin es un estudio que se para Hydrographic surveyor la presencia de una enfermedad  cuando no tiene sntomas. Un plan de dieta y ejercicios adecuado para usted. Qu debo saber sobre las pruebas de deteccin y los anlisis para prevenir cadas? Realizarse pruebas de deteccin y [employer] es la mejor manera de Hydrographic surveyor un problema de salud de forma temprana. El diagnstico y tratamiento tempranos le brindan la mejor oportunidad de Chief Technology Officer las afecciones mdicas que son comunes despus de los 33 aos de edad. Ciertas afecciones y elecciones de estilo de vida pueden hacer que sea ms propenso a sufrir Engineer, manufacturing. El mdico puede recomendarle lo siguiente: Controles regulares de la visin. Una visin deficiente y afecciones como las cataratas pueden hacer que sea ms propenso a sufrir Engineer, manufacturing. Si [place] lentes, asegrese de obtener una receta actualizada si su visin cambia. Revisin de medicamentos. Revise regularmente con el mdico todos los medicamentos que toma, incluidos los medicamentos de [place]. Consulte al [employer] efectos secundarios que pueden hacer que sea ms propenso a sufrir Engineer, manufacturing. Informe al mdico si alguno de los medicamentos que toma lo hace sentir mareado o somnoliento. Controles de fuerza y equilibrio. El mdico puede recomendar ciertos estudios para controlar su fuerza y equilibrio al estar de pie, al caminar o al cambiar de posicin. Examen de los pies. El dolor y Chiropractor en los pies, como tambin no utilizar el calzado Hamilton, pueden hacer que sea ms propenso a sufrir Engineer, manufacturing. Pruebas de deteccin,  que incluyen las siguientes: Pruebas de deteccin para la osteoporosis. La osteoporosis es una afeccin que hace que los huesos se tornen ms dbiles y se quiebren con ms facilidad. Pruebas de deteccin para la presin arterial. Los cambios en la presin arterial y los medicamentos para Chief Operating Officer la presin arterial pueden hacerlo sentir mareado. Prueba de deteccin de la depresin. Es ms probable que sufra una cada si tiene miedo a caerse,  se siente deprimido o se siente incapaz de Probation officer. Prueba de deteccin de consumo de alcohol. Beber demasiado alcohol puede afectar su equilibrio y puede hacer que sea ms propenso a sufrir Engineer, site. Siga estas indicaciones en su casa: Estilo de vida No beba alcohol si: Su mdico le indica no hacerlo. Si bebe alcohol: Limite la cantidad que bebe a lo siguiente: De 0 a 1 medida por da para las mujeres. De 0 a 2 medidas por da para los hombres. Sepa cunta cantidad de alcohol hay en las bebidas que toma. En los [address], una medida equivale a una botella de cerveza de 12 oz (355 ml), un vaso de vino de 5 oz (148 ml) o un vaso de una bebida alcohlica de alta graduacin de 1 oz (44 ml). No consuma ningn producto que contenga nicotina o tabaco. Estos productos incluyen cigarrillos, tabaco para Theatre manager y aparatos de vapeo, como los Administrator, Civil Service. Si necesita ayuda para dejar de consumir estos productos, consulte al [employer]. Actividad  Siga un programa de ejercicio regular para mantenerse en forma. Esto lo ayudar a Radio producer equilibrio. Consulte al mdico qu tipos de ejercicios son adecuados para usted. Si necesita un bastn o un andador, selo segn las recomendaciones del mdico. Utilice calzado con buen apoyo y suela antideslizante. Seguridad  Retire los [employer] puedan causar tropiezos tales como alfombras, cables u obstculos. Instale equipos de seguridad, como barras para sostn en los baos y barandas de seguridad en las escaleras. Mantenga las habitaciones y los pasillos bien iluminados. Indicaciones generales Hable con el mdico sobre sus riesgos de sufrir una cada. Infrmele a su mdico si: Se cae. Asegrese de informarle a su mdico acerca de todas las cadas, incluso aquellas que parecen ser [employer]. Se siente mareado, cansado (tiene fatiga) o siente que pierde el equilibrio. Use los medicamentos de venta libre y los recetados  solamente como se lo haya indicado el mdico. Estos incluyen suplementos. Siga una dieta sana y [place] un peso saludable. Una dieta saludable incluye productos lcteos descremados, carnes bajas en contenido de grasa ([place]), fibra de granos enteros, frijoles y [place] frutas y verduras. Mantngase al da con las vacunas. Realcese los estudios de rutina de la salud, dentales y de Wellsite geologist. Resumen Tener un estilo de vida saludable y recibir cuidados preventivos pueden ayudar a Research scientist (physical sciences) salud y el bienestar despus de los 65 aos de [place]. Realizarse pruebas de deteccin y [employer] es la mejor manera de Engineer, manufacturing un problema de salud de forma temprana y Eber [place] a Automotive engineer una cada. El diagnstico y tratamiento tempranos le brindan la mejor oportunidad de Chief Operating Officer las afecciones mdicas ms comunes en las personas mayores de 65 aos de edad. Las cadas son la causa principal de las fracturas de huesos y lesiones en la cabeza de personas mayores de 65 aos de edad. Tome precauciones para evitar una cada en su casa. Trabaje con el mdico para saber qu cambios que puede hacer para mejorar su salud y [place], y para prevenir las cadas. Esta informacin no  tiene Marine scientist el consejo del mdico. Asegrese de hacerle al mdico cualquier pregunta que tenga. Document Revised: 12/05/2020 Document Reviewed: 12/05/2020 Elsevier Patient Education  [place].

## 2022-08-01 NOTE — Progress Notes (Signed)
Subjective:   Emily Hamilton is a 77 y.o. female who presents for Medicare Annual (Subsequent) preventive examination.  Review of Systems     I connected with Emily Hamilton on 08/01/2022 at 10:37 am by telephone and verified that I am speaking with the correct person using two identifiers. I discussed the limitations, risks, security and privacy concerns of performing an evaluation and management service by telephone and the availability of in person appointments. I also discussed with the patient that there may be a patient responsible charge related to this service. The patient expressed understanding and agreed to proceed.   Patient location: Home My Location: [place] on the telephone call: Myself, patient and Emily Hamilton [phone]          Objective:    There were no vitals filed for this visit. There is no height or weight on file to calculate BMI.     08/01/2022   10:44 AM 12/17/2021    9:26 AM 03/30/2021    9:14 AM 06/02/2020    2:07 PM  Advanced Directives  Does Patient Have a Medical Advance Directive? No No No No  Would patient like information on creating a medical advance directive? Yes (ED - Information included in AVS)  Yes (ED - Information included in AVS) No - Patient declined    Current Medications (verified) Outpatient Encounter Medications as of 08/01/2022  Medication Sig   amLODipine (NORVASC) 5 MG tablet Take 1 tablet (5 mg total) by mouth daily.   atorvastatin (LIPITOR) 40 MG tablet TOME UNA TABLETA TODOS LOS DIAS   hydrochlorothiazide (MICROZIDE) 12.5 MG capsule Take 1 capsule (12.5 mg total) by mouth daily. TOME UNA CAPSULA TODOS LOS DIAS   isosorbide mononitrate (IMDUR) 30 MG 24 hr tablet TAKE 1/2 OF A TABLET (15 MG TOTAL) POR VIA ORAL A DIARIO   metFORMIN (GLUCOPHAGE) 500 MG tablet Take 1 tablet (500 mg total) by mouth 2 (two) times daily with a meal.   pantoprazole (PROTONIX) 20 MG tablet Take 1 tablet (20 mg total) by  mouth daily.   rivaroxaban (XARELTO) 20 MG TABS tablet Take 1 tablet (20 mg total) by mouth daily with supper.   No facility-administered encounter medications on file as of 08/01/2022.    Allergies (verified) Lisinopril   History: Past Medical History:  Diagnosis Date   Controlled type 2 diabetes mellitus ([place])    Family history of diabetes mellitus    Hyperlipidemia    Hypertension    Osteopenia    Status post right knee replacement    Past Surgical History:  Procedure Laterality Date   BREAST EXCISIONAL BIOPSY Left    CHOLECYSTECTOMY     Right knee replacement     Family History  Problem Relation Age of Onset   Diabetes Mother    Glaucoma Mother    Social History   Socioeconomic History   Marital status: Married    Spouse name: Not on file   Number of children: Not on file   Years of education: Not on file   Highest education level: Not on file  Occupational History   Not on file  Tobacco Use   Smoking status: Never   Smokeless tobacco: Never  Vaping Use   Vaping Use: Never used  Substance and Sexual Activity   Alcohol use: Never   Drug use: Never   Sexual activity: Never  Other Topics Concern   Not on file  Social History Narrative   Not on  file   Social Determinants of Health   Financial Resource Strain: Low Risk  (08/01/2022)   Overall Financial Resource Strain (CARDIA)    Difficulty of Paying Living Expenses: Not hard at all  Food Insecurity: No Food Insecurity (08/01/2022)   Hunger Vital Sign    Worried About Running Out of Food in the Last Year: Never true    Ran Out of Food in the Last Year: Never true  Transportation Needs: No Transportation Needs (06/17/2022)   PRAPARE - Administrator, Civil Service (Medical): No    Lack of Transportation (Non-Medical): No  Physical Activity: Insufficiently Active (08/01/2022)   Exercise Vital Sign    Days of Exercise per Week: 3 days    Minutes of Exercise per Session: 20 min  Stress: No  Stress Concern Present (08/01/2022)   [employer] of Occupational Health - Occupational Stress Questionnaire    Feeling of Stress : Not at all  Social Connections: Moderately Isolated (08/01/2022)   Social Connection and Isolation Panel [NHANES]    Frequency of Communication with Friends and Family: More than three times a week    Frequency of Social Gatherings with Friends and Family: More than three times a week    Attends Religious Services: More than 4 times per year    Active Member of [employer] or Organizations: No    Attends Banker Meetings: Never    Marital Status: Separated    Tobacco Counseling Counseling given: Not Answered   Clinical Intake:  Pre-visit preparation completed: No  Pain : No/denies pain     Diabetes: Yes CBG done?: No  How often do you need to have someone help you when you read instructions, pamphlets, or other written materials from your doctor or pharmacy?: 1 - Never  Diabetic?NO  Interpreter Needed?: Yes Interpreter Agency: [employer] Interpreter Name: Emily Hamilton ID: [id number]      Activities of Daily Living    08/01/2022   10:45 AM  In your present state of health, do you have any difficulty performing the following activities:  Hearing? 0  Vision? 0  Difficulty concentrating or making decisions? 0  Walking or climbing stairs? 0  Dressing or bathing? 0  Doing errands, shopping? 0  Preparing Food and eating ? N  Using the Toilet? N  In the past six months, have you accidently leaked urine? N  Do you have problems with loss of bowel control? N  Managing your Medications? N  Managing your Finances? N  Housekeeping or managing your Housekeeping? N    Patient Care Team: Marcine Matar, MD as PCP - General (Internal Medicine) Parke Poisson, MD as PCP - Cardiology (Cardiology)  Indicate any recent Medical Services you may have received from other than [facility] providers in the past year (date may be  approximate).     Assessment:   This is a routine wellness examination for Emily Hamilton.  Hearing/Vision screen No results found.  Dietary issues and exercise activities discussed: Current Exercise Habits: Home exercise routine, Type of exercise: walking, Time (Minutes): 20, Frequency (Times/Week): 3, Weekly Exercise (Minutes/Week): 60, Intensity: Mild, Exercise limited by: None identified   Goals Addressed   None    Depression Screen    08/01/2022   10:43 AM 07/11/2022    9:17 AM 07/18/2021   10:26 AM 03/30/2021    9:09 AM 03/28/2021    9:36 AM 05/28/2020    9:12 AM 03/16/2020    9:45 AM  PHQ 2/9 Scores  PHQ - 2 Score 0 0 0 2 2 0 0  PHQ- 9 Score  0 6 5 5       Fall Risk    08/01/2022   10:45 AM 07/11/2022    9:06 AM 07/18/2021   10:26 AM 03/30/2021    9:15 AM 03/28/2021    9:35 AM  Fall Risk   Falls in the past year? 0 0 0 0 0  Number falls in past yr: 0 0 0 0 0  Injury with Fall? 0 0 0 0 0  Risk for fall due to :  No Fall Risks No Fall Risks No Fall Risks No Fall Risks  Follow up    Falls evaluation completed     FALL RISK PREVENTION PERTAINING TO THE HOME:  Any stairs in or around the home? Yes  If so, are there any without handrails? No  Home free of loose throw rugs in walkways, pet beds, electrical cords, etc? Yes  Adequate lighting in your home to reduce risk of falls? Yes   ASSISTIVE DEVICES UTILIZED TO PREVENT FALLS:  Life alert? No  Use of a cane, walker or w/c? No  Grab bars in the bathroom? No  Shower chair or bench in shower? No  Elevated toilet seat or a handicapped toilet? No   TIMED UP AND GO:  Was the test performed? No .  Length of time to ambulate 10 feet: N/A sec.   Gait slow and steady without use of assistive device  Cognitive Function:    08/01/2022   10:46 AM 05/28/2020    9:36 AM  MMSE - Mini Mental State Exam  Not completed: Unable to complete   Orientation to time  5  Orientation to Place  5  Registration  3  Attention/ Calculation  5   Recall  3  Language- name 2 objects  2  Language- repeat  1  Language- follow 3 step command  3  Language- read & follow direction  1  Write a sentence  1  Copy design  1  Total score  30        08/01/2022   10:46 AM 03/30/2021    9:17 AM  6CIT Screen  What Year? 0 points 0 points  What month? 0 points 0 points  What time? 0 points 0 points  Count back from 20 0 points 0 points  Months in reverse 0 points 0 points  Repeat phrase 0 points 10 points  Total Score 0 points 10 points    Immunizations Immunization History  Administered Date(s) Administered   Fluad Quad(high Dose 65+) 07/11/2022   Influenza,inj,Quad PF,6+ Mos 03/24/2018, 08/05/2019, 05/28/2020, 03/28/2021   PFIZER(Purple Top)SARS-COV-2 Vaccination 11/16/2019, 01/11/2020   Pneumococcal Conjugate (Pcv15) 07/11/2022   Pneumococcal Polysaccharide-23 10/25/2020   Tdap 10/25/2020    TDAP status: Up to date  Flu Vaccine status: Up to date  Pneumococcal vaccine status: Up to date  Covid-19 vaccine status: Completed vaccines  Qualifies for Shingles Vaccine? Yes   Zostavax completed No  Shingrix Completed?: No.    Education has been provided regarding the importance of this vaccine. Patient has been advised to call insurance company to determine out of pocket expense if they have not yet received this vaccine. Advised may also receive vaccine at local pharmacy or Health Dept. Verbalized acceptance and understanding.  Screening Tests Health Maintenance  Topic Date Due   Zoster Vaccines- Shingrix (1 of 2) Never done   COVID-19 Vaccine (3 - Pfizer risk series)  02/08/2020   Medicare Annual Wellness (AWV)  03/30/2022   MAMMOGRAM  04/13/2022   OPHTHALMOLOGY EXAM  06/28/2022   HEMOGLOBIN A1C  01/10/2023   Diabetic kidney evaluation - eGFR measurement  06/12/2023   Diabetic kidney evaluation - Urine ACR  07/12/2023   FOOT EXAM  07/12/2023   COLONOSCOPY (Pts 45-25yrs Insurance coverage will need to be confirmed)   05/20/2028   DTaP/Tdap/Td (2 - Td or Tdap) 10/26/2030   Pneumonia Vaccine 62+ Years old  Completed   INFLUENZA VACCINE  Completed   DEXA SCAN  Completed   Hepatitis C Screening  Completed   HPV VACCINES  Aged Out    Health Maintenance  Health Maintenance Due  Topic Date Due   Zoster Vaccines- Shingrix (1 of 2) Never done   COVID-19 Vaccine (3 - Pfizer risk series) 02/08/2020   Medicare Annual Wellness (AWV)  03/30/2022   MAMMOGRAM  04/13/2022   OPHTHALMOLOGY EXAM  06/28/2022    Colorectal cancer screening: Type of screening: Colonoscopy. Completed 05/30/2018. Repeat every 10 years  Mammogram status: Completed 04/13/2020. Repeat every year Next MM is scheduled for 09/05/2022  Bone Density Screening: Completed 11/03/2019 see chart for results.  Lung Cancer Screening: (Low Dose CT Chest recommended if Age 58-80 years, 30 pack-year currently smoking OR have quit w/in 15years.) does not qualify.   Lung Cancer Screening Referral: No  Additional Screening:  Hepatitis C Screening: does qualify; Completed 07/11/2022  Vision Screening: Recommended annual ophthalmology exams for early detection of glaucoma and other disorders of the eye. Is the patient up to date with their annual eye exam?  Yes  Who is the provider or what is the name of the office in which the patient attends annual eye exams? [facility] If pt is not established with a provider, would they like to be referred to a provider to establish care? No .   Dental Screening: Recommended annual dental exams for proper oral hygiene  Community Resource Referral / Chronic Care Management: CRR required this visit?  No   CCM required this visit?  No      Plan:     I have personally reviewed and noted the following in the patient's chart:   Medical and social history Use of alcohol, tobacco or illicit drugs  Current medications and supplements including opioid prescriptions. Patient is not currently taking opioid  prescriptions. Functional ability and status Nutritional status Physical activity Advanced directives List of other physicians Hospitalizations, surgeries, and ER visits in previous 12 months Vitals Screenings to include cognitive, depression, and falls Referrals and appointments  In addition, I have reviewed and discussed with patient certain preventive protocols, quality metrics, and best practice recommendations. A written personalized care plan for preventive services as well as general preventive health recommendations were provided to patient.     Gomez Cleverly, [place]   08/01/2022   Nurse Notes: I spent 30 minutes on this telephone encounter

## 2022-08-29 ENCOUNTER — Other Ambulatory Visit: Payer: Self-pay

## 2022-08-29 ENCOUNTER — Ambulatory Visit: Payer: Medicare HMO | Attending: Internal Medicine

## 2022-08-29 ENCOUNTER — Emergency Department (HOSPITAL_COMMUNITY)
Admission: EM | Admit: 2022-08-29 | Discharge: 2022-08-29 | Disposition: A | Discharge status: Home / Self Care | Payer: Medicare HMO | Attending: Emergency Medicine | Admitting: Emergency Medicine

## 2022-08-29 ENCOUNTER — Encounter (HOSPITAL_COMMUNITY): Payer: Self-pay

## 2022-08-29 ENCOUNTER — Ambulatory Visit
Admission: RE | Admit: 2022-08-29 | Discharge: 2022-08-29 | Disposition: A | Discharge status: Home / Self Care | Payer: Medicare HMO | Source: Ambulatory Visit | Attending: Internal Medicine | Admitting: Internal Medicine

## 2022-08-29 DIAGNOSIS — R7989 Other specified abnormal findings of blood chemistry: Secondary | ICD-10-CM

## 2022-08-29 DIAGNOSIS — H9203 Otalgia, bilateral: Secondary | ICD-10-CM | POA: Diagnosis not present

## 2022-08-29 DIAGNOSIS — Z1231 Encounter for screening mammogram for malignant neoplasm of breast: Secondary | ICD-10-CM | POA: Diagnosis not present

## 2022-08-29 DIAGNOSIS — H5713 Ocular pain, bilateral: Secondary | ICD-10-CM | POA: Insufficient documentation

## 2022-08-29 DIAGNOSIS — Z7901 Long term (current) use of anticoagulants: Secondary | ICD-10-CM | POA: Diagnosis not present

## 2022-08-29 MED ORDER — CETIRIZINE HCL 10 MG PO TABS: 10.0000 mg | ORAL_TABLET | Freq: Every day | ORAL | 0 refills | Status: AC | PRN

## 2022-08-29 MED ORDER — FLUORESCEIN SODIUM 1 MG OP STRP
1.0000 | ORAL_STRIP | Freq: Once | OPHTHALMIC | Status: AC
  Administered 2022-08-29: 1 via OPHTHALMIC
  Filled 2022-08-29: qty 1

## 2022-08-29 MED ORDER — TETRACAINE HCL 0.5 % OP SOLN
2.0000 [drp] | Freq: Once | OPHTHALMIC | Status: AC
  Administered 2022-08-29: 2 [drp] via OPHTHALMIC
  Filled 2022-08-29: qty 4

## 2022-08-29 NOTE — Discharge Instructions (Signed)
You have been treated with allergy medicine.  Hopefully that will help.  Follow-up with the doctor if symptoms do not improve.

## 2022-08-29 NOTE — ED Provider Notes (Signed)
[facility] Provider Note   CSN: [id number] Arrival date & time: 08/29/22  0944     History  Chief Complaint  Patient presents with   Emily Hamilton is a 77 y.o. female.   Otalgia Eye Pain  Patient presents with bilateral ear pain and bilateral eye pain.  However worse on the right.  Is had eye pain for about 2 weeks and the ear pain for about 1 week.  No drainage.  Slight sore throat.  No known sick contacts no vision changes.  Photophobia.  No difficulty hearing.  No fevers or chills.  Patient wears glasses at baseline.  No headache.       Home Medications Prior to Admission medications   Medication Sig Start Date End Date Taking? Authorizing Provider  cetirizine (ZYRTEC ALLERGY) 10 MG tablet Take 1 tablet (10 mg total) by mouth daily as needed for allergies. 08/29/22  Yes Davonna Belling, MD  amLODipine (NORVASC) 5 MG tablet Take 1 tablet (5 mg total) by mouth daily. 07/11/22   Ladell Pier, MD  atorvastatin (LIPITOR) 40 MG tablet TOME UNA TABLETA TODOS LOS DIAS 07/11/22   Ladell Pier, MD  hydrochlorothiazide (MICROZIDE) 12.5 MG capsule Take 1 capsule (12.5 mg total) by mouth daily. TOME UNA CAPSULA TODOS LOS DIAS 07/11/22   Ladell Pier, MD  isosorbide mononitrate (IMDUR) 30 MG 24 hr tablet TAKE 1/2 OF A TABLET (15 MG TOTAL) POR VIA ORAL A DIARIO 07/11/22   Ladell Pier, MD  metFORMIN (GLUCOPHAGE) 500 MG tablet Take 1 tablet (500 mg total) by mouth 2 (two) times daily with a meal. 07/11/22   Ladell Pier, MD  pantoprazole (PROTONIX) 20 MG tablet Take 1 tablet (20 mg total) by mouth daily. 07/11/22   Ladell Pier, MD  rivaroxaban (XARELTO) 20 MG TABS tablet Take 1 tablet (20 mg total) by mouth daily with supper. 07/11/22   Ladell Pier, MD      Allergies    Lisinopril    Review of Systems   Review of Systems  HENT:  Positive for ear pain.    Eyes:  Positive for pain.    Physical Exam Updated Vital Signs BP (!) 110/51   Pulse 85   Temp 99.3 F (37.4 C)   Resp 18   Ht 4' 7"$  (1.397 m)   Wt 79.4 kg   SpO2 94%   BMI 40.67 kg/m  Physical Exam Vitals and nursing note reviewed.  HENT:     Right Ear: Tympanic membrane normal.     Ears:     Comments: Left TM obscured by cerumen. Eyes:     Extraocular Movements: Extraocular movements intact.     Pupils: Pupils are equal, round, and reactive to light.  Cardiovascular:     Rate and Rhythm: Regular rhythm.  Musculoskeletal:     Cervical back: Neck supple.  Neurological:     Mental Status: She is alert.     ED Results / Procedures / Treatments   Labs (all labs ordered are listed, but only abnormal results are displayed) Labs Reviewed - No data to display  EKG None  Radiology No results found.  Procedures Procedures    Medications Ordered in ED Medications  tetracaine (PONTOCAINE) 0.5 % ophthalmic solution 2 drop (2 drops Both Eyes Given 08/29/22 1054)  fluorescein ophthalmic strip 1 strip (1 strip Both Eyes  Given 08/29/22 1054)    ED Course/ Medical Decision Making/ A&P                             Medical Decision Making Risk OTC drugs. Prescription drug management.   Patient with eye and ear pain.  No sinus congestion.  No nasal drainage.  No vision changes.  No difficulty hearing.  Differential diagnosis includes allergic reaction, infection, nonspecific symptoms.  Doubt severe intracranial abnormality such as tumor causing.  Will check with Woods lamp and Tono-Pen.  Patient's pressure was 15 in each eye.  Eye movements intact.  No corneal uptake.  Potentially could be an allergic conjunctivitis/irritation also has ear involvement.  Throat reassuring.  Will treat symptomatically with some antihistamines/Zyrtec and have follow-up as an outpatient as needed.        Final Clinical Impression(s) / ED Diagnoses Final diagnoses:  Pain of both eyes   Ear pain, bilateral    Rx / DC Orders ED Discharge Orders          Ordered    cetirizine (ZYRTEC ALLERGY) 10 MG tablet  Daily PRN        08/29/22 1131              Davonna Belling, MD 08/29/22 1135

## 2022-08-29 NOTE — ED Triage Notes (Signed)
Reports bilateral eye and ear pain but more on the right x 15 days.  Denies cough, nasal congestion headache chest pain or sob.

## 2022-08-29 NOTE — ED Notes (Signed)
AVS reviewed with pt prior to discharge. Pt verbalizes understanding. Belongings with pt upon depart. Ambulatory to POV with family.

## 2022-08-30 LAB — HEPATIC FUNCTION PANEL
ALT: 36 IU/L — ABNORMAL HIGH (ref 0–32)
AST: 49 IU/L — ABNORMAL HIGH (ref 0–40)
Albumin: 4.2 g/dL (ref 3.8–4.8)
Alkaline Phosphatase: 103 IU/L (ref 44–121)
Bilirubin Total: 0.5 mg/dL (ref 0.0–1.2)
Bilirubin, Direct: 0.14 mg/dL (ref 0.00–0.40)
Total Protein: 7.7 g/dL (ref 6.0–8.5)

## 2022-09-04 ENCOUNTER — Other Ambulatory Visit: Payer: Self-pay | Admitting: Physician Assistant

## 2022-09-04 DIAGNOSIS — Z86010 Personal history of colonic polyps: Secondary | ICD-10-CM | POA: Diagnosis not present

## 2022-09-04 DIAGNOSIS — R7401 Elevation of levels of liver transaminase levels: Secondary | ICD-10-CM | POA: Diagnosis not present

## 2022-09-04 DIAGNOSIS — K2970 Gastritis, unspecified, without bleeding: Secondary | ICD-10-CM | POA: Diagnosis not present

## 2022-09-04 DIAGNOSIS — R1011 Right upper quadrant pain: Secondary | ICD-10-CM | POA: Diagnosis not present

## 2022-09-04 DIAGNOSIS — R1010 Upper abdominal pain, unspecified: Secondary | ICD-10-CM

## 2022-09-05 ENCOUNTER — Ambulatory Visit: Payer: Medicare HMO

## 2022-10-02 ENCOUNTER — Ambulatory Visit
Admission: RE | Admit: 2022-10-02 | Discharge: 2022-10-02 | Disposition: A | Discharge status: Home / Self Care | Payer: Medicare HMO | Source: Ambulatory Visit | Attending: Physician Assistant | Admitting: Physician Assistant

## 2022-10-02 DIAGNOSIS — R1010 Upper abdominal pain, unspecified: Secondary | ICD-10-CM

## 2022-10-02 DIAGNOSIS — Z9049 Acquired absence of other specified parts of digestive tract: Secondary | ICD-10-CM | POA: Diagnosis not present

## 2022-10-02 DIAGNOSIS — R1011 Right upper quadrant pain: Secondary | ICD-10-CM | POA: Diagnosis not present

## 2022-10-09 ENCOUNTER — Other Ambulatory Visit: Payer: Self-pay | Admitting: Internal Medicine

## 2022-10-09 DIAGNOSIS — R1013 Epigastric pain: Secondary | ICD-10-CM

## 2022-10-15 ENCOUNTER — Encounter: Payer: Self-pay | Admitting: Internal Medicine

## 2022-10-15 ENCOUNTER — Ambulatory Visit: Payer: Medicare HMO | Attending: Internal Medicine | Admitting: Internal Medicine

## 2022-10-15 VITALS — BP 138/79 | HR 82 | Ht <= 58 in | Wt 172.6 lb

## 2022-10-15 DIAGNOSIS — I10 Essential (primary) hypertension: Secondary | ICD-10-CM

## 2022-10-15 DIAGNOSIS — I4821 Permanent atrial fibrillation: Secondary | ICD-10-CM | POA: Diagnosis not present

## 2022-10-15 DIAGNOSIS — E119 Type 2 diabetes mellitus without complications: Secondary | ICD-10-CM | POA: Diagnosis not present

## 2022-10-15 DIAGNOSIS — D6869 Other thrombophilia: Secondary | ICD-10-CM

## 2022-10-15 DIAGNOSIS — E785 Hyperlipidemia, unspecified: Secondary | ICD-10-CM | POA: Diagnosis not present

## 2022-10-15 NOTE — Progress Notes (Signed)
Cardiology Office Note   Date:  10/15/2022   ID:  Emily Hamilton, DOB 11-Jun-1946, MRN [id number]  PCP:  Ladell Pier, MD  Cardiologist:  Elouise Munroe, MD  Electrophysiologist:  None   Evaluation Performed:  Follow-Up Visit  Chief Complaint:  afib  History of Present Illness:    Emily Hamilton is a 77 y.o. female with DM, HLD, HTN, chest pain and permanent atrial fibrillation. She was initially seen 08/18/2019 for chest pain and was incidentally found to be in new atrial fibrillation per her EKG at that visit. After shared decision making she was started on Eliquis 5 mg BID. Her CHA2DS2-Vasc score was 4.  At her visit 10/24/2019 we reviewed her normal results of stress test and reassuring results of echo. She had 100% burden of atrial fibrillation but had good rate control not on AV nodal blockers. We discussed afib anticoagulation in depth. She was tolerating well without bleeding. We discussed cardioversion for attempt at rest or sinus rhythm.  Patient was overall asymptomatic and didn't feel it was necessary at that time.  We discussed that with severe left atrial enlargement, likelihood that she will stay in sinus rhythm is low, which may make attempts at restoration of sinus rhythm not as useful if patient is asymptomatic. She understood this and agreed.  She was seen in follow-up 01/03/2020 via telemedicine. She was not taking Imdur or aspirin. Agreed to stop aspirin. We discussed going back on Imdur, no clear reason for stopping. She denied significant chest pain or palpitations. Imdur was resumed.  In 05/2020 she reported chronic diarrhea that had resolved when she discontinued her Eliquis. She was taking 81 mg ASA in its place. After shared decision making she deferred re-challenging with Eliquis and wished to switch to Xarelto 20 mg once daily.  She had presented to the ED 06/11/2022 with concerns for viral URI. Her EKG showed atrial fibrillation with RVR at  111 bpm and nonspecific ST/T wave abnormality. She was on Xarelto. BNP and troponin unremarkable. Covid and flu test negative. She was treated for suspected bronchitis.  Most recently she was in the ED 08/29/2022 with complaints of bilateral ear and eye pain. She was treated symptomatically and discharged with outpatient follow-up.   Today's visit was performed with assistance of the in person Spanish interpreter. She has not been seen by cardiology since our last appointment. She states that she is here today at the request of her PCP for follow-up.   Currently she is feeling okay although she does feel occasional heart racing palpitations that are not bothersome. She is compliant with Xarelto. We resumed discussion of cardioversion which she defers at this time.   Her diabetes is well controlled on metformin.   Additionally she complains of recent epigastric pain. She had an endoscopy that was reportedly normal.  She denies any shortness of breath, or peripheral edema. No lightheadedness, headaches, syncope, orthopnea, or PND.   Past Medical History:  Diagnosis Date   Controlled type 2 diabetes mellitus    Family history of diabetes mellitus    Hyperlipidemia    Hypertension    Osteopenia    Status post right knee replacement    Past Surgical History:  Procedure Laterality Date   BREAST EXCISIONAL BIOPSY Left    CHOLECYSTECTOMY     Right knee replacement       Current Meds  Medication Sig   amLODipine (NORVASC) 5 MG tablet Take 1 tablet (5 mg total)  by mouth daily.   aspirin EC 81 MG tablet daily.   atorvastatin (LIPITOR) 40 MG tablet TOME UNA TABLETA TODOS LOS DIAS   cetirizine (ZYRTEC ALLERGY) 10 MG tablet Take 1 tablet (10 mg total) by mouth daily as needed for allergies.   hydrochlorothiazide (MICROZIDE) 12.5 MG capsule Take 1 capsule (12.5 mg total) by mouth daily. TOME UNA CAPSULA TODOS LOS DIAS   isosorbide mononitrate (IMDUR) 30 MG 24 hr tablet TAKE 1/2 OF A TABLET (15  MG TOTAL) POR VIA ORAL A DIARIO   lisinopril (ZESTRIL) 5 MG tablet daily.   metFORMIN (GLUCOPHAGE) 500 MG tablet Take 1 tablet (500 mg total) by mouth 2 (two) times daily with a meal.   pantoprazole (PROTONIX) 20 MG tablet TOME UNA TABLETA TODOS LOS DIAS   rivaroxaban (XARELTO) 20 MG TABS tablet Take 1 tablet (20 mg total) by mouth daily with supper.     Allergies:   Lisinopril   Social History   Tobacco Use   Smoking status: Never   Smokeless tobacco: Never  Vaping Use   Vaping Use: Never used  Substance Use Topics   Alcohol use: Never   Drug use: Never     Family Hx: The patient's family history includes Diabetes in her mother; Glaucoma in her mother. There is no history of Breast cancer.  ROS:   Please see the history of present illness.    (+) Palpitations All other systems reviewed and are negative.   Prior CV studies:   The following studies were reviewed today:  Monitor  09/2019: Indication: atrial fibrillation, chest pain   Minimum HR (bpm): 39 Maximum HR (bpm): 164   Supraventricular Ectopy: none SVT: none   Ventricular Ectopy: rare <1% NSVT: none Ventricular Tachycardia: none   Pauses: 3.4 seconds in afib, 09/17/19 at 8:53 am.  AV block: none   Atrial fibrillation: 100% burden of atrial fibrillation.  Rate controlled day - 98% of time. Rate controlled night - 100% of time.  Diary events: none   IMPRESSION: 100% burden of atrial fibrillation with good rate control.   Lexiscan Stress Myoview  09/05/2019: Nuclear stress EF: 75%. The left ventricular ejection fraction is hyperdynamic (>65%). There was no ST segment deviation noted during stress. The study is normal. This is a low risk study.   Normal resting and stress perfusion. No ischemia or infarction EF 75%  Echocardiogram  09/05/2019:  1. Left ventricular ejection fraction, by estimation, is 60 to 65%. The  left ventricle has normal function. The left ventricle has no regional  wall motion  abnormalities. There is mild concentric left ventricular  hypertrophy. Left ventricular diastolic  parameters are indeterminate.   2. Right ventricular systolic function is normal. The right ventricular  size is normal. There is normal pulmonary artery systolic pressure.   3. Left atrial size was severely dilated.   4. The mitral valve is normal in structure and function. No evidence of  mitral valve regurgitation. No evidence of mitral stenosis.   5. The aortic valve is normal in structure and function. Aortic valve  regurgitation is not visualized. No aortic stenosis is present.   6. The inferior vena cava is normal in size with greater than 50%  respiratory variability, suggesting right atrial pressure of 3 mmHg.    Labs/Other Tests and Data Reviewed:    EKG:   EKG is personally reviewed. 10/15/2022:  Atrial fibrillation. Nonspecific T wave abnormality. 06/11/2022:  Atrial fibrillation with rapid ventricular response at 111 bpm, Nonspecific ST  and T wave abnormality.  Recent Labs: 06/11/2022: BUN 7; Creatinine, Ser 0.97; Hemoglobin 14.1; Platelets 190; Potassium 4.0; Sodium 137 07/11/2022: BNP 114.8 08/29/2022: ALT 36   Recent Lipid Panel Lab Results  Component Value Date/Time   CHOL 177 07/11/2022 10:13 AM   TRIG 189 (H) 07/11/2022 10:13 AM   HDL 48 07/11/2022 10:13 AM   CHOLHDL 3.7 07/11/2022 10:13 AM   LDLCALC 97 07/11/2022 10:13 AM    Wt Readings from Last 3 Encounters:  10/15/22 172 lb 9.6 oz (78.3 kg)  08/29/22 175 lb (79.4 kg)  07/11/22 175 lb (79.4 kg)     Objective:    Vital Signs:  BP 138/79   Pulse 82   Ht 4\' 7"  (1.397 m)   Wt 172 lb 9.6 oz (78.3 kg)   SpO2 96%   BMI 40.12 kg/m    Constitutional: No acute distress Eyes: sclera non-icteric, normal conjunctiva and lids ENMT: normal dentition, moist mucous membranes Cardiovascular: irregular rhythm, normal rate, no murmur. S1 and S2 normal. No jugular venous distention.  Respiratory: clear to  auscultation bilaterally GI : normal bowel sounds, soft and nontender. No distention.   MSK: extremities warm, well perfused. No edema.  NEURO: grossly nonfocal exam, moves all extremities. PSYCH: alert and oriented x 3, normal mood and affect.   ASSESSMENT & PLAN:    1. Permanent atrial fibrillation   2. Secondary hypercoagulable state   3. Primary hypertension   4. Hyperlipidemia, unspecified hyperlipidemia type   5. Type 2 diabetes mellitus without complication, without long-term current use of insulin   6. Hyperlipidemia LDL goal <70     Permanent Afib - continue eliquis. Rate well controlled without rate control.Continue xarelto 20 mg daily. Repeat echocardiogram to ensure no sequela of permanent af.  HTN - continue amlodipine 5 mg daily, imdur 15 mg daily  HLD - continue atorvastatin. LDL optimized, triglycerides elevated, likely related to DM2  DM2 - metformin per PCP.    Total time of encounter: 30 minutes total time of encounter, including 20 minutes spent in face-to-face patient care on the date of this encounter. This time includes coordination of care and counseling regarding above mentioned problem list. Remainder of non-face-to-face time involved reviewing chart documents/testing relevant to the patient encounter and documentation in the medical record. I have independently reviewed documentation from referring provider.   Cherlynn Kaiser, MD, [facility]   Medication Adjustments/Labs and Tests Ordered: Current medicines are reviewed at length with the patient today.  Concerns regarding medicines are outlined above.   Tests Ordered: Orders Placed This Encounter  Procedures   EKG 12-Lead   ECHOCARDIOGRAM COMPLETE   Medication Changes: No orders of the defined types were placed in this encounter.  Patient Instructions  Medication Instructions:  No Changes In Medications at this time.  *If you need a refill on your cardiac medications  before your next appointment, please call your pharmacy*  Testing/Procedures: Your physician has requested that you have an echocardiogram. Echocardiography is a painless test that uses sound waves to create images of your heart. It provides your doctor with information about the size and shape of your heart and how well your heart's chambers and valves are working. You may receive an ultrasound enhancing agent through an IV if needed to better visualize your heart during the echo.This procedure takes approximately one hour. There are no restrictions for this procedure. This will take place at the [address].   Follow-Up:  At [facility], you and your health needs are our priority.  As part of our continuing mission to provide you with exceptional heart care, we have created designated Provider Care Teams.  These Care Teams include your primary Cardiologist (physician) and Advanced Practice Providers (APPs -  Physician Assistants and Nurse Practitioners) who all work together to provide you with the care you need, when you need it.  Your next appointment:   1 year(s)  Provider:   Elouise Munroe, MD      [facility],acting as a scribe for Elouise Munroe, MD.,have documented all relevant documentation on the behalf of Elouise Munroe, MD,as directed by  Elouise Munroe, MD while in the presence of Elouise Munroe, MD.  I, Elouise Munroe, MD, have reviewed all documentation for the visit on 10/15/2022. The documentation on today's date of service for the exam, diagnosis, procedures, and orders are all accurate and complete.

## 2022-10-15 NOTE — Patient Instructions (Signed)
Medication Instructions:  No Changes In Medications at this time.  *If you need a refill on your cardiac medications before your next appointment, please call your pharmacy*  Testing/Procedures: Your physician has requested that you have an echocardiogram. Echocardiography is a painless test that uses sound waves to create images of your heart. It provides your doctor with information about the size and shape of your heart and how well your heart's chambers and valves are working. You may receive an ultrasound enhancing agent through an IV if needed to better visualize your heart during the echo.This procedure takes approximately one hour. There are no restrictions for this procedure. This will take place at the [address], [address].   Follow-Up: At [facility], you and your health needs are our priority.  As part of our continuing mission to provide you with exceptional heart care, we have created designated Provider Care Teams.  These Care Teams include your primary Cardiologist (physician) and Advanced Practice Providers (APPs -  Physician Assistants and Nurse Practitioners) who all work together to provide you with the care you need, when you need it.  Your next appointment:   1 year(s)  Provider:   Elouise Munroe, MD

## 2022-11-04 DIAGNOSIS — H31012 Macula scars of posterior pole (postinflammatory) (post-traumatic), left eye: Secondary | ICD-10-CM | POA: Diagnosis not present

## 2022-11-04 DIAGNOSIS — H11043 Peripheral pterygium, stationary, bilateral: Secondary | ICD-10-CM | POA: Diagnosis not present

## 2022-11-04 DIAGNOSIS — H2513 Age-related nuclear cataract, bilateral: Secondary | ICD-10-CM | POA: Diagnosis not present

## 2022-11-04 DIAGNOSIS — H40013 Open angle with borderline findings, low risk, bilateral: Secondary | ICD-10-CM | POA: Diagnosis not present

## 2022-11-04 DIAGNOSIS — E119 Type 2 diabetes mellitus without complications: Secondary | ICD-10-CM | POA: Diagnosis not present

## 2022-11-04 LAB — HM DIABETES EYE EXAM

## 2022-11-06 ENCOUNTER — Other Ambulatory Visit: Payer: Self-pay

## 2022-11-06 ENCOUNTER — Ambulatory Visit: Payer: Medicare HMO | Attending: Internal Medicine | Admitting: Internal Medicine

## 2022-11-06 ENCOUNTER — Encounter: Payer: Self-pay | Admitting: Internal Medicine

## 2022-11-06 DIAGNOSIS — R7989 Other specified abnormal findings of blood chemistry: Secondary | ICD-10-CM

## 2022-11-06 DIAGNOSIS — Z7984 Long term (current) use of oral hypoglycemic drugs: Secondary | ICD-10-CM

## 2022-11-06 DIAGNOSIS — E1159 Type 2 diabetes mellitus with other circulatory complications: Secondary | ICD-10-CM

## 2022-11-06 DIAGNOSIS — E785 Hyperlipidemia, unspecified: Secondary | ICD-10-CM | POA: Diagnosis not present

## 2022-11-06 DIAGNOSIS — I4821 Permanent atrial fibrillation: Secondary | ICD-10-CM

## 2022-11-06 DIAGNOSIS — I152 Hypertension secondary to endocrine disorders: Secondary | ICD-10-CM | POA: Diagnosis not present

## 2022-11-06 DIAGNOSIS — K760 Fatty (change of) liver, not elsewhere classified: Secondary | ICD-10-CM | POA: Diagnosis not present

## 2022-11-06 DIAGNOSIS — Z0100 Encounter for examination of eyes and vision without abnormal findings: Secondary | ICD-10-CM | POA: Diagnosis not present

## 2022-11-06 DIAGNOSIS — Z23 Encounter for immunization: Secondary | ICD-10-CM

## 2022-11-06 DIAGNOSIS — E1169 Type 2 diabetes mellitus with other specified complication: Secondary | ICD-10-CM

## 2022-11-06 LAB — POCT GLYCOSYLATED HEMOGLOBIN (HGB A1C): HbA1c, POC (controlled diabetic range): 6 % (ref 0.0–7.0)

## 2022-11-06 LAB — GLUCOSE, POCT (MANUAL RESULT ENTRY): POC Glucose: 125 mg/dl — AB (ref 70–99)

## 2022-11-06 MED ORDER — ZOSTER VAC RECOMB ADJUVANTED 50 MCG/0.5ML IM SUSR
0.5000 mL | Freq: Once | INTRAMUSCULAR | 0 refills | Status: AC
Start: 2022-11-06 — End: 2022-11-07
  Filled 2022-11-06: qty 0.5, 1d supply, fill #0

## 2022-11-06 NOTE — Progress Notes (Signed)
Patient ID: Emily Hamilton, female    DOB: 1946-06-05  MRN: [id number]  CC: Diabetes (DM f/u. Franchot Erichsen medications - would like to see which ones she can stop taking.  Vickey Sages about being unable to loose weight with diet changes.)   Subjective: Emily Hamilton is a 77 y.o. female who presents for chronic ds management Her concerns today include:  atient with history of DM type II, HL, HTN, osteopenia, permanent atrial fibrillation, Vit D def   AMN Language interpreter used during this encounter. #[id number] [name]   DM: Results for orders placed or performed in visit on 11/06/22  POCT glycosylated hemoglobin (Hb A1C)  Result Value Ref Range   Hemoglobin A1C     HbA1c POC (<> result, manual entry)     HbA1c, POC (prediabetic range)     HbA1c, POC (controlled diabetic range) 125.0 (A) 0.0 - 7.0 %  A1C 6 Not checking BS Does ok with eating habits; walks 10 mins 3 days a wk Taking and tolerating Metformin 500 mg BID Had eye exam done 11/04/2022 at [facility] Taking and tolerating Lipitor 40 mg daily.  Lfts have been mildly elev but coming down.  Last AST/ALT 49/36.  Screen for hep C/B negative.  Recent US showed changes of hepatitic steatosis.  She does not drink alcoholic beverages. HTN/Afib: taking meds including Xarelto 20 mg daily, isosorbide 30 mg half a tablet daily, HCTZ 12.5 mg daily, amlodipine 5 mg daily, lisinopril 5 mg daily.  She is also taking atorvastatin 40 mg daily for cholesterol. No device to check BP No bruising or bleeding on Xarelto.CHA2DS2-VAS score of 4.  Saw cardiologist Dr. Jacques Navy earlier this month.   Patient Active Problem List   Diagnosis Date Noted   Chronic pain of left knee 04/24/2021   Class 3 severe obesity due to excess calories with serious comorbidity and body mass index (BMI) of 45.0 to 49.9 in adult 04/23/2021   Atrial fibrillation 10/13/2020   Hyperlipidemia LDL goal <70 10/18/2018   Osteopenia 10/18/2018   Type 2 diabetes  mellitus without complication, without long-term current use of insulin 09/08/2018     Current Outpatient Medications on File Prior to Visit  Medication Sig Dispense Refill   amLODipine (NORVASC) 5 MG tablet Take 1 tablet (5 mg total) by mouth daily. 90 tablet 1   aspirin EC 81 MG tablet daily.     atorvastatin (LIPITOR) 40 MG tablet TOME UNA TABLETA TODOS LOS DIAS 90 tablet 1   cetirizine (ZYRTEC ALLERGY) 10 MG tablet Take 1 tablet (10 mg total) by mouth daily as needed for allergies. 14 tablet 0   hydrochlorothiazide (MICROZIDE) 12.5 MG capsule Take 1 capsule (12.5 mg total) by mouth daily. TOME UNA CAPSULA TODOS LOS DIAS 90 capsule 1   isosorbide mononitrate (IMDUR) 30 MG 24 hr tablet TAKE 1/2 OF A TABLET (15 MG TOTAL) POR VIA ORAL A DIARIO 45 tablet 1   lisinopril (ZESTRIL) 5 MG tablet daily.     metFORMIN (GLUCOPHAGE) 500 MG tablet Take 1 tablet (500 mg total) by mouth 2 (two) times daily with a meal. 180 tablet 1   pantoprazole (PROTONIX) 20 MG tablet TOME UNA TABLETA TODOS LOS DIAS 90 tablet 0   rivaroxaban (XARELTO) 20 MG TABS tablet Take 1 tablet (20 mg total) by mouth daily with supper. 90 tablet 1   No current facility-administered medications on file prior to visit.    Allergies  Allergen Reactions   Lisinopril Other (See  Comments)    angioedema    Social History   Socioeconomic History   Marital status: Married    Spouse name: Not on file   Number of children: Not on file   Years of education: Not on file   Highest education level: Not on file  Occupational History   Not on file  Tobacco Use   Smoking status: Never   Smokeless tobacco: Never  Vaping Use   Vaping Use: Never used  Substance and Sexual Activity   Alcohol use: Never   Drug use: Never   Sexual activity: Never  Other Topics Concern   Not on file  Social History Narrative   Not on file   Social Determinants of Health   Financial Resource Strain: Low Risk  (08/01/2022)   Overall Financial  Resource Strain (CARDIA)    Difficulty of Paying Living Expenses: Not hard at all  Food Insecurity: No Food Insecurity (08/01/2022)   Hunger Vital Sign    Worried About Running Out of Food in the Last Year: Never true    Ran Out of Food in the Last Year: Never true  Transportation Needs: No Transportation Needs (06/17/2022)   PRAPARE - Administrator, Civil Service (Medical): No    Lack of Transportation (Non-Medical): No  Physical Activity: Insufficiently Active (08/01/2022)   Exercise Vital Sign    Days of Exercise per Week: 3 days    Minutes of Exercise per Session: 20 min  Stress: No Stress Concern Present (08/01/2022)   [employer] of Occupational Health - Occupational Stress Questionnaire    Feeling of Stress : Not at all  Social Connections: Moderately Isolated (08/01/2022)   Social Connection and Isolation Panel [NHANES]    Frequency of Communication with Friends and Family: More than three times a week    Frequency of Social Gatherings with Friends and Family: More than three times a week    Attends Religious Services: More than 4 times per year    Active Member of [employer] or Organizations: No    Attends Banker Meetings: Never    Marital Status: Separated  Intimate Partner Violence: Not At Risk (08/01/2022)   Humiliation, Afraid, Rape, and Kick questionnaire    Fear of Current or Ex-Partner: No    Emotionally Abused: No    Physically Abused: No    Sexually Abused: No    Family History  Problem Relation Age of Onset   Diabetes Mother    Glaucoma Mother    Breast cancer Neg Hx     Past Surgical History:  Procedure Laterality Date   BREAST EXCISIONAL BIOPSY Left    CHOLECYSTECTOMY     Right knee replacement      ROS: Review of Systems Negative except as stated above  PHYSICAL EXAM: BP 121/72 (BP Location: Left Arm, Patient Position: Sitting, Cuff Size: Large)   Pulse 81   Temp 98.2 F (36.8 C) (Oral)   Ht 4\' 7"  (1.397 m)   Wt 174  lb (78.9 kg)   SpO2 98%   BMI 40.44 kg/m   Physical Exam  General appearance - alert, well appearing, and in no distress Mental status - normal mood, behavior, speech, dress, motor activity, and thought processes Neck - supple, no significant adenopathy Chest - clear to auscultation, no wheezes, rales or rhonchi, symmetric air entry Heart - normal rate, regular rhythm, normal S1, S2, no murmurs, rubs, clicks or gallops Extremities - spider veins on legs  Latest Ref Rng & Units 08/29/2022    8:35 AM 07/11/2022   10:13 AM 06/11/2022    4:30 PM  CMP  Glucose 70 - 99 mg/dL   284   BUN 8 - 23 mg/dL   7   Creatinine 1.32 - 1.00 mg/dL   4.40   Sodium 102 - 725 mmol/L   137   Potassium 3.5 - 5.1 mmol/L   4.0   Chloride 98 - 111 mmol/L   103   CO2 22 - 32 mmol/L   26   Calcium 8.9 - 10.3 mg/dL   9.0   Total Protein 6.0 - 8.5 g/dL 7.7  7.8  7.7   Total Bilirubin 0.0 - 1.2 mg/dL 0.5  0.4  0.7   Alkaline Phos 44 - 121 IU/L 103  112  91   AST 0 - 40 IU/L 49  53  94   ALT 0 - 32 IU/L 36  33  63    Lipid Panel     Component Value Date/Time   CHOL 177 07/11/2022 1013   TRIG 189 (H) 07/11/2022 1013   HDL 48 07/11/2022 1013   CHOLHDL 3.7 07/11/2022 1013   LDLCALC 97 07/11/2022 1013    CBC    Component Value Date/Time   WBC 7.2 06/11/2022 1630   RBC 4.68 06/11/2022 1630   HGB 14.1 06/11/2022 1630   HGB 14.2 03/28/2021 1020   HCT 42.8 06/11/2022 1630   HCT 41.4 03/28/2021 1020   PLT 190 06/11/2022 1630   PLT 214 03/28/2021 1020   MCV 91.5 06/11/2022 1630   MCV 85 03/28/2021 1020   MCH 30.1 06/11/2022 1630   MCHC 32.9 06/11/2022 1630   RDW 13.6 06/11/2022 1630   RDW 13.7 03/28/2021 1020   LYMPHSABS 1.7 12/17/2021 0934   LYMPHSABS 1.8 09/08/2018 1100   MONOABS 0.4 12/17/2021 0934   EOSABS 0.2 12/17/2021 0934   EOSABS 0.1 09/08/2018 1100   BASOSABS 0.0 12/17/2021 0934   BASOSABS 0.0 09/08/2018 1100    ASSESSMENT AND PLAN:  1. Type 2 diabetes mellitus with morbid  obesity At goal.  Continue metformin. Encourage healthy eating habits. Encouraged her to move is much as she can. - POCT glucose (manual entry) - POCT glycosylated hemoglobin (Hb A1C)  2. Hypertension associated with type 2 diabetes mellitus At goal on medications listed above.  3. Hyperlipidemia associated with type 2 diabetes mellitus Last LDL 4 months ago was not at goal at 97.  Continue atorvastatin 40 mg daily.  We will plan to recheck lipid profile on subsequent visit.  4. Permanent atrial fibrillation Followed by cardiology. On Xarelto.  H&H normal.  5. Fatty liver 6. Abnormal LFTs AST and ALT have decreased.  Could be due to the atorvastatin as well as hepatic steatosis.  Discussed the importance of weight loss.  7. Need for shingles vaccine -Agreeable to starting Shingrix vaccine series.  Advised that the vaccine can cause some redness and soreness at the injection site. - Zoster Vaccine Adjuvanted [facility]) injection; Inject 0.5 mLs into the muscle once for 1 dose.  Dispense: 0.5 mL; Refill: 0   Patient was given the opportunity to ask questions.  Patient verbalized understanding of the plan and was able to repeat key elements of the plan.   This documentation was completed using Paediatric nurse.  Any transcriptional errors are unintentional.  Orders Placed This Encounter  Procedures   POCT glucose (manual entry)   POCT glycosylated hemoglobin (Hb A1C)  Requested Prescriptions    No prescriptions requested or ordered in this encounter    No follow-ups on file.  Karle Plumber, MD, FACP

## 2022-11-12 ENCOUNTER — Ambulatory Visit (HOSPITAL_COMMUNITY): Payer: Medicare HMO | Attending: Cardiology

## 2022-11-12 DIAGNOSIS — I4821 Permanent atrial fibrillation: Secondary | ICD-10-CM | POA: Diagnosis not present

## 2022-11-12 LAB — ECHOCARDIOGRAM COMPLETE: S' Lateral: 2.3 cm

## 2022-12-24 ENCOUNTER — Ambulatory Visit: Payer: Medicare HMO | Attending: Internal Medicine | Admitting: Pharmacist

## 2022-12-24 ENCOUNTER — Encounter: Payer: Self-pay | Admitting: Pharmacist

## 2022-12-24 DIAGNOSIS — E785 Hyperlipidemia, unspecified: Secondary | ICD-10-CM

## 2022-12-24 DIAGNOSIS — E1169 Type 2 diabetes mellitus with other specified complication: Secondary | ICD-10-CM

## 2022-12-24 DIAGNOSIS — I152 Hypertension secondary to endocrine disorders: Secondary | ICD-10-CM

## 2022-12-24 DIAGNOSIS — Z79899 Other long term (current) drug therapy: Secondary | ICD-10-CM

## 2022-12-24 DIAGNOSIS — Z7984 Long term (current) use of oral hypoglycemic drugs: Secondary | ICD-10-CM

## 2022-12-24 DIAGNOSIS — E1159 Type 2 diabetes mellitus with other circulatory complications: Secondary | ICD-10-CM

## 2022-12-24 MED ORDER — AMLODIPINE BESYLATE 5 MG PO TABS
5.0000 mg | ORAL_TABLET | Freq: Every day | ORAL | 1 refills | Status: DC
Start: 2022-12-24 — End: 2023-06-22

## 2022-12-24 MED ORDER — ATORVASTATIN CALCIUM 40 MG PO TABS
ORAL_TABLET | ORAL | 1 refills | Status: DC
Start: 2022-12-24 — End: 2023-06-22

## 2022-12-24 MED ORDER — METFORMIN HCL 500 MG PO TABS
500.0000 mg | ORAL_TABLET | Freq: Two times a day (BID) | ORAL | 1 refills | Status: DC
Start: 2022-12-24 — End: 2023-06-22

## 2022-12-24 MED ORDER — HYDROCHLOROTHIAZIDE 12.5 MG PO CAPS
12.5000 mg | ORAL_CAPSULE | Freq: Every day | ORAL | 1 refills | Status: DC
Start: 2022-12-24 — End: 2023-09-16

## 2022-12-24 NOTE — Progress Notes (Signed)
12/24/2022 Name: Emily Hamilton MRN: [id number] DOB: 1946-05-02  No chief complaint on file.   Emily Hamilton is a 77 y.o. year old female who presented for a telephone visit.   They were referred to the pharmacist by a quality report for assistance in managing medication access. She failed MAD measures per [facility] last year.  Patient is participating in a Managed Medicaid Plan: no  Subjective:  Care Team: Primary Care Provider: Marcine Matar, MD ; Next Scheduled Visit: 03/19/23  Medication Access/Adherence  Current Pharmacy:  [id number] [place], [place] - [address] AT [facility] [address] [place] [employer] [place] [address] Phone: [phone] Fax: [phone]   Patient reports affordability concerns with their medications: No  Patient reports access/transportation concerns to their pharmacy: No  Patient reports adherence concerns with their medications:  No     Medication Management:  Current adherence strategy: sufficient. Called and confirmed with her pharmacy that she has been filling rxns regularly this year.   Patient reports Good adherence to medications  Patient reports the following barriers to adherence: none  Recent fill dates:  -10/07/22 for the following: amlodipine, atorvastatin, HCTZ, and metformin.  -All were filled for 90-day supplies.  -She will need new rxns at next fill later this month.   Objective:  Lab Results  Component Value Date   HGBA1C 6.0 11/06/2022    Lab Results  Component Value Date   CREATININE 0.97 06/11/2022   BUN 7 (L) 06/11/2022   NA 137 06/11/2022   K 4.0 06/11/2022   CL 103 06/11/2022   CO2 26 06/11/2022    Lab Results  Component Value Date   CHOL 177 07/11/2022   HDL 48 07/11/2022   LDLCALC 97 07/11/2022   TRIG 189 (H) 07/11/2022   CHOLHDL 3.7 07/11/2022    Medications Reviewed Today     Reviewed by Marcine Matar, MD (Physician) on 11/06/22 at 1332  Med List  Status: <None>   Medication Order Taking? Sig Documenting Provider Last Dose Status Informant  amLODipine (NORVASC) 5 MG tablet [id number] Yes Take 1 tablet (5 mg total) by mouth daily. Marcine Matar, MD Taking Active   aspirin EC 81 MG tablet [id number] Yes daily. [provider] Taking Active   atorvastatin (LIPITOR) 40 MG tablet [id number] Yes TOME UNA TABLETA TODOS LOS DIAS Marcine Matar, MD Taking Active   cetirizine (ZYRTEC ALLERGY) 10 MG tablet [id number] Yes Take 1 tablet (10 mg total) by mouth daily as needed for allergies. Benjiman Core, MD Taking Active   hydrochlorothiazide (MICROZIDE) 12.5 MG capsule [id number] Yes Take 1 capsule (12.5 mg total) by mouth daily. TOME UNA CAPSULA TODOS LOS DIAS Marcine Matar, MD Taking Active   isosorbide mononitrate (IMDUR) 30 MG 24 hr tablet [id number] Yes TAKE 1/2 OF A TABLET (15 MG TOTAL) POR VIA ORAL A Braulio Conte, MD Taking Active   lisinopril (ZESTRIL) 5 MG tablet [id number] Yes daily. [provider] Taking Active   metFORMIN (GLUCOPHAGE) 500 MG tablet [id number] Yes Take 1 tablet (500 mg total) by mouth 2 (two) times daily with a meal. Marcine Matar, MD Taking Active   pantoprazole (PROTONIX) 20 MG tablet [id number] Yes TOME UNA TABLETA TODOS LOS DIAS Marcine Matar, MD Taking Active   rivaroxaban (XARELTO) 20 MG TABS tablet [id number] Yes Take 1 tablet (20 mg total) by mouth daily with supper. Marcine Matar, MD Taking Active   Zoster  Vaccine Adjuvanted [facility]) injection [id number] Yes Inject 0.5 mLs into the muscle once for 1 dose. Marcine Matar, MD  Active               Assessment/Plan:   Medication Management: - Currently strategy sufficient to maintain appropriate adherence to prescribed medication regimen. Specifically, her A1c shows excellent control and fill histories are current. - Suggested use of weekly pill box to organize medications - Created list of medication,  indication, and administration time. Provided to patient  Follow Up Plan: w/ PCP in Sept.   Butch Penny, PharmD, BCACP, CPP Clinical Pharmacist [facility] [phone]

## 2023-01-05 ENCOUNTER — Other Ambulatory Visit: Payer: Self-pay | Admitting: Internal Medicine

## 2023-01-05 DIAGNOSIS — I152 Hypertension secondary to endocrine disorders: Secondary | ICD-10-CM

## 2023-01-05 DIAGNOSIS — R1013 Epigastric pain: Secondary | ICD-10-CM

## 2023-03-03 ENCOUNTER — Emergency Department (HOSPITAL_COMMUNITY)
Admission: EM | Admit: 2023-03-03 | Discharge: 2023-03-03 | Disposition: A | Discharge status: Home / Self Care | Payer: Medicare HMO | Attending: Emergency Medicine | Admitting: Emergency Medicine

## 2023-03-03 ENCOUNTER — Emergency Department (HOSPITAL_COMMUNITY): Payer: Medicare HMO

## 2023-03-03 DIAGNOSIS — Z79899 Other long term (current) drug therapy: Secondary | ICD-10-CM | POA: Insufficient documentation

## 2023-03-03 DIAGNOSIS — I10 Essential (primary) hypertension: Secondary | ICD-10-CM | POA: Insufficient documentation

## 2023-03-03 DIAGNOSIS — Z7984 Long term (current) use of oral hypoglycemic drugs: Secondary | ICD-10-CM | POA: Insufficient documentation

## 2023-03-03 DIAGNOSIS — R079 Chest pain, unspecified: Secondary | ICD-10-CM | POA: Insufficient documentation

## 2023-03-03 DIAGNOSIS — E119 Type 2 diabetes mellitus without complications: Secondary | ICD-10-CM | POA: Insufficient documentation

## 2023-03-03 DIAGNOSIS — Z7901 Long term (current) use of anticoagulants: Secondary | ICD-10-CM | POA: Insufficient documentation

## 2023-03-03 DIAGNOSIS — R0789 Other chest pain: Secondary | ICD-10-CM | POA: Diagnosis not present

## 2023-03-03 DIAGNOSIS — R918 Other nonspecific abnormal finding of lung field: Secondary | ICD-10-CM | POA: Diagnosis not present

## 2023-03-03 DIAGNOSIS — Z7982 Long term (current) use of aspirin: Secondary | ICD-10-CM | POA: Diagnosis not present

## 2023-03-03 DIAGNOSIS — Z1152 Encounter for screening for COVID-19: Secondary | ICD-10-CM | POA: Diagnosis not present

## 2023-03-03 LAB — CBC
HCT: 42.7 % (ref 36.0–46.0)
Hemoglobin: 13.8 g/dL (ref 12.0–15.0)
MCH: 29.4 pg (ref 26.0–34.0)
MCHC: 32.3 g/dL (ref 30.0–36.0)
MCV: 91 fL (ref 80.0–100.0)
Platelets: 178 10*3/uL (ref 150–400)
RBC: 4.69 MIL/uL (ref 3.87–5.11)
RDW: 13.7 % (ref 11.5–15.5)
WBC: 6.7 10*3/uL (ref 4.0–10.5)
nRBC: 0 % (ref 0.0–0.2)

## 2023-03-03 LAB — BASIC METABOLIC PANEL
Anion gap: 12 (ref 5–15)
BUN: 11 mg/dL (ref 8–23)
CO2: 23 mmol/L (ref 22–32)
Calcium: 9.1 mg/dL (ref 8.9–10.3)
Chloride: 101 mmol/L (ref 98–111)
Creatinine, Ser: 0.69 mg/dL (ref 0.44–1.00)
GFR, Estimated: >60 mL/min (ref >60)
Glucose, Bld: 124 mg/dL — ABNORMAL HIGH (ref 70–99)
Potassium: 3.9 mmol/L (ref 3.5–5.1)
Sodium: 136 mmol/L (ref 135–145)

## 2023-03-03 LAB — MAGNESIUM: Magnesium: 2.1 mg/dL (ref 1.7–2.4)

## 2023-03-03 LAB — SARS CORONAVIRUS 2 BY RT PCR: SARS Coronavirus 2 by RT PCR: NEGATIVE

## 2023-03-03 LAB — TROPONIN I (HIGH SENSITIVITY)
Troponin I (High Sensitivity): 10 ng/L (ref <18)
Troponin I (High Sensitivity): 11 ng/L (ref <18)

## 2023-03-03 MED ORDER — LIDOCAINE 5 % EX PTCH: 1.0000 | MEDICATED_PATCH | CUTANEOUS | 0 refills | Status: AC

## 2023-03-03 NOTE — ED Notes (Signed)
Pt verbalized understanding of discharge instructions. Opportunity for questions provided.   Interpreter: Jacqlyn Larsen [phone] utilized to assist w/ discharge

## 2023-03-03 NOTE — ED Provider Notes (Signed)
[place] EMERGENCY DEPARTMENT AT [facility] Provider Note   CSN: [id number] Arrival date & time: 03/03/23  1057     History  Chief Complaint  Patient presents with   Chest Pain    Emily Hamilton is a 77 y.o. female history of diabetes, A-fib on Xarelto presented with 3 weeks of chest pain.  Patient is unsure of what elicits her symptoms but states that the chest pain goes to her back and she feels generally weak.  Patient states she has not have history of enlarged aorta and that she only has A-fib with hypertension.  Patient states that she has been taking her Xarelto and denies any missed doses.  Patient denies any fevers, nausea vomiting, shortness of breath, changes in sensation/motor skills, dysuria, bowel changes, neck pain, vision changes, headache.  Patient states that she has been able to eat and drink without issue and also walk without issue.  Patient denies any recent travel/hospitalization/surgery, personal history of cancer, estrogen use.  Symptoms and not elicited after eating food and patient denies any sick contacts.  Spanish interpreter [phone]  Home Medications Prior to Admission medications   Medication Sig Start Date End Date Taking? Authorizing Provider  lidocaine (LIDODERM) 5 % Place 1 patch onto the skin daily. Remove & Discard patch within 12 hours or as directed by MD 03/03/23  Yes Jaqueline Uber, Beverly Gust, PA-C  amLODipine (NORVASC) 5 MG tablet Take 1 tablet (5 mg total) by mouth daily. 12/24/22   Marcine Matar, MD  aspirin EC 81 MG tablet daily.    [provider]  atorvastatin (LIPITOR) 40 MG tablet TOME UNA TABLETA TODOS LOS DIAS 12/24/22   Marcine Matar, MD  cetirizine (ZYRTEC ALLERGY) 10 MG tablet Take 1 tablet (10 mg total) by mouth daily as needed for allergies. 08/29/22   Benjiman Core, MD  hydrochlorothiazide (MICROZIDE) 12.5 MG capsule Take 1 capsule (12.5 mg total) by mouth daily. TOME UNA CAPSULA TODOS LOS DIAS 12/24/22    Marcine Matar, MD  isosorbide mononitrate (IMDUR) 30 MG 24 hr tablet TAKE 1/2 OF A TABLET (15 MG TOTAL) POR VIA ORAL A DIARIO 01/05/23   Marcine Matar, MD  lisinopril (ZESTRIL) 5 MG tablet daily.    [provider]  metFORMIN (GLUCOPHAGE) 500 MG tablet Take 1 tablet (500 mg total) by mouth 2 (two) times daily with a meal. 12/24/22   Marcine Matar, MD  pantoprazole (PROTONIX) 20 MG tablet TOME 1 TABLETA POR VIA ORAL TODOS LOS DIAS 01/05/23   Marcine Matar, MD  rivaroxaban (XARELTO) 20 MG TABS tablet Take 1 tablet (20 mg total) by mouth daily with supper. 07/11/22   Marcine Matar, MD      Allergies    Lisinopril    Review of Systems   Review of Systems  Cardiovascular:  Positive for chest pain.    Physical Exam Updated Vital Signs BP 114/61   Pulse 88   Temp 98 F (36.7 C)   Resp 17   SpO2 97%  Physical Exam Vitals reviewed.  Constitutional:      General: She is not in acute distress. HENT:     Head: Normocephalic and atraumatic.  Eyes:     Extraocular Movements: Extraocular movements intact.     Conjunctiva/sclera: Conjunctivae normal.     Pupils: Pupils are equal, round, and reactive to light.  Cardiovascular:     Rate and Rhythm: Normal rate. Rhythm irregular.     Pulses:  Normal pulses.     Heart sounds: Normal heart sounds.     Comments: 2+ bilateral radial/dorsalis pedis pulses with irregular rate Pulmonary:     Effort: Pulmonary effort is normal. No respiratory distress.     Breath sounds: Normal breath sounds.  Abdominal:     Palpations: Abdomen is soft.     Tenderness: There is no abdominal tenderness. There is no guarding or rebound.  Musculoskeletal:        General: Normal range of motion.     Cervical back: Normal range of motion and neck supple.     Right lower leg: No edema.     Left lower leg: No edema.     Comments: 5 out of 5 bilateral grip/leg extension strength No calf tenderness  Skin:    General: Skin is warm and  dry.     Capillary Refill: Capillary refill takes less than 2 seconds.  Neurological:     General: No focal deficit present.     Mental Status: She is alert and oriented to person, place, and time.     Comments: Sensation intact in all 4 limbs  Psychiatric:        Mood and Affect: Mood normal.     ED Results / Procedures / Treatments   Labs (all labs ordered are listed, but only abnormal results are displayed) Labs Reviewed  BASIC METABOLIC PANEL - Abnormal; Notable for the following components:      Result Value   Glucose, Bld 124 (*)    All other components within normal limits  SARS CORONAVIRUS 2 BY RT PCR  CBC  MAGNESIUM  TROPONIN I (HIGH SENSITIVITY)  TROPONIN I (HIGH SENSITIVITY)    EKG EKG Interpretation Date/Time:  Tuesday March 03 2023 10:58:08 EDT Ventricular Rate:  75 PR Interval:    QRS Duration:  70 QT Interval:  386 QTC Calculation: 431 R Axis:   44  Text Interpretation: Atrial fibrillation T wave abnormality, consider inferolateral ischemia Abnormal ECG When compared with ECG of 11-Jun-2022 16:10, PREVIOUS ECG IS PRESENT No acute changes TWI in inferior and lateral leads not new Confirmed by Derwood Kaplan [phone]) on 03/03/2023 11:15:29 AM  Radiology DG Chest 2 View  Result Date: 03/03/2023 CLINICAL DATA:  Chest pain EXAM: CHEST - 2 VIEW COMPARISON:  Chest radiograph dated 07/11/2022 FINDINGS: Right upper quadrant surgical clips. Normal lung volumes. Bilateral mid lung linear atelectasis/scarring. No focal consolidations. No pleural effusion or pneumothorax. The heart size and mediastinal contours are within normal limits. No acute osseous abnormality. IMPRESSION: Bilateral mid lung linear atelectasis/scarring. No focal consolidations. Electronically Signed   By: Agustin Cree M.D.   On: 03/03/2023 14:04    Procedures Procedures    Medications Ordered in ED Medications - No data to display  ED Course/ Medical Decision Making/ A&P                                  Medical Decision Making Amount and/or Complexity of Data Reviewed Labs: ordered. Radiology: ordered.   Emily Hamilton 77 y.o. presented today for chest pain. Working DDx that I considered at this time includes, but not limited to, ACS, GERD, pulmonary embolism, community-acquired pneumonia, aortic dissection, pneumothorax, underlying bony abnormality, anemia, thyrotoxicosis, esophageal rupture.    R/o Dx:  ACS, GERD, pulmonary embolism, community-acquired pneumonia, aortic dissection, pneumothorax, underlying bony abnormality, anemia, thyrotoxicosis, esophageal rupture: These are considered less likely due to  history of present illness and physical exam findings. Aortic Dissection: less likely based on the location, quality, onset, and severity of symptoms in this case. Patient also has a lack of underlying history of AD or TAA.   Review of prior external notes: 10/15/2022 office visit  Unique Tests and My Interpretation:  EKG: A-fib 75 bpm, T wave versions however these are not new in the inferior and lateral leads, no ST elevations noted Troponin: 10, 11 CXR: Bilateral atelectasis/scarring CBC: Unremarkable BMP: Unremarkable Magnesium: Unremarkable  Discussion with Independent Historian: none  Discussion of Management of Tests: None  Risk: Low: based on diagnostic testing/clinical impression and treatment plan  Risk Stratification Score: None  Staffed with Pickering, MD  Plan: On exam patient was in no acute distress stable vitals. Patient's physical was remarkable for regular heart rate indicative of A-fib however was not in A-fib with RVR and the rest of her exam was unremarkable.  Patient's labs and imaging from triage were ultimately reassuring however awaiting delta troponin but will add on COVID as patient is endorsing generalized fatigue as well.  Doubt aortic dissection given patient's presentation and how comfortable she looks along with not having  previous history with reassuring physical exam.  Patient stable at this time.  Patient's labs came back reassuring including delta troponin and COVID-negative.  I recheck patient's vitals are still stable and patient was resting comfortably in the bed.  I spoke to the patient and we had shared decision making in which the patient agreed that she would like to be discharged and follow-up with her cardiologist.  We spoke about if symptoms are change or worsen to return to ER and we may do a further workup.  Encouraged patient to continue using her Tylenol every 6 hours as needed for pain and will prescribe lidocaine patches if patient's back pain is MSK.   Patient was given return precautions. Patient stable for discharge at this time.  Patient verbalized understanding of plan.         Final Clinical Impression(s) / ED Diagnoses Final diagnoses:  Chest pain, unspecified type    Rx / DC Orders ED Discharge Orders          Ordered    lidocaine (LIDODERM) 5 %  Every 24 hours        03/03/23 1734              Remi Deter 03/03/23 1736    Benjiman Core, MD 03/03/23 2333

## 2023-03-03 NOTE — ED Provider Triage Note (Signed)
Emergency Medicine Provider Triage Evaluation Note  Emily Hamilton , a 77 y.o. female  was evaluated in triage.  Pt complains of CP.  Review of Systems  Positive: CP, weakness Negative: SOB, palpitations, LOC  Physical Exam  BP (!) 146/85 (BP Location: Left Arm)   Pulse 88   Temp 98.6 F (37 C) (Oral)   Resp 17   SpO2 99%  Gen:   Awake, no distress    Resp:  Normal effort   MSK:   Moves extremities without difficulty   Other:    Medical Decision Making  Medically screening exam initiated at 11:23 AM.  Appropriate orders placed.  Emily Hamilton was informed that the remainder of the evaluation will be completed by another provider, this initial triage assessment does not replace that evaluation, and the importance of remaining in the ED until their evaluation is complete.  Denies cardiac hx, had recent ECHO through PCP normal EF   Smitty Knudsen, PA-C 03/03/23 1125

## 2023-03-03 NOTE — Discharge Instructions (Signed)
Haga un seguimiento con su cardilogo sobre los sntomas recientes y la visita a la sala de Sports administrator.  Hoy sus laboratorios e imgenes son tranquilizadores junto con sus signos vitales.  Sus sntomas pueden estar relacionados con su fibrilacin auricular y Musician un seguimiento con su cardilogo.  Puede tomar Tylenol cada 6 horas segn sea necesario para el dolor y [employer] sntomas.  Si los sntomas [place] o [place], regrese a Sports administrator.  Esta traduccin se realiz con el traductor de [employer] y se hicieron todos los intentos para Licensed conveyancer.

## 2023-03-03 NOTE — ED Triage Notes (Signed)
Pt c/o three weeks of fatigue, chest pain, and back pain.

## 2023-03-11 ENCOUNTER — Other Ambulatory Visit: Payer: Self-pay | Admitting: Physician Assistant

## 2023-03-11 ENCOUNTER — Encounter: Payer: Self-pay | Admitting: Physician Assistant

## 2023-03-11 ENCOUNTER — Telehealth: Payer: Self-pay

## 2023-03-11 ENCOUNTER — Ambulatory Visit: Payer: Medicare HMO | Attending: Physician Assistant | Admitting: Physician Assistant

## 2023-03-11 VITALS — BP 121/77 | HR 76 | Ht <= 58 in | Wt 174.8 lb

## 2023-03-11 DIAGNOSIS — Z603 Acculturation difficulty: Secondary | ICD-10-CM

## 2023-03-11 DIAGNOSIS — Z23 Encounter for immunization: Secondary | ICD-10-CM

## 2023-03-11 DIAGNOSIS — E119 Type 2 diabetes mellitus without complications: Secondary | ICD-10-CM | POA: Diagnosis not present

## 2023-03-11 DIAGNOSIS — N644 Mastodynia: Secondary | ICD-10-CM

## 2023-03-11 DIAGNOSIS — Z758 Other problems related to medical facilities and other health care: Secondary | ICD-10-CM | POA: Diagnosis not present

## 2023-03-11 DIAGNOSIS — R0789 Other chest pain: Secondary | ICD-10-CM

## 2023-03-11 DIAGNOSIS — Z7984 Long term (current) use of oral hypoglycemic drugs: Secondary | ICD-10-CM | POA: Diagnosis not present

## 2023-03-11 DIAGNOSIS — Z09 Encounter for follow-up examination after completed treatment for conditions other than malignant neoplasm: Secondary | ICD-10-CM | POA: Diagnosis not present

## 2023-03-11 LAB — GLUCOSE, POCT (MANUAL RESULT ENTRY): POC Glucose: 123 mg/dl — AB (ref 70–99)

## 2023-03-11 NOTE — Telephone Encounter (Signed)
Transition Care Management Unsuccessful Follow-up Telephone Call  Date of discharge and from where:  Emily Hamilton 8/20  Attempts:  1st Attempt  Reason for unsuccessful TCM follow-up call:  No answer/busy   Emily Hamilton  [facility] Guide, Phone: [phone] Website: [email or web address]

## 2023-03-11 NOTE — Progress Notes (Signed)
Patient ID: Emily Hamilton, female   DOB: 12-06-1945, 77 y.o.   MRN: [id number]    Emily Hamilton, is a 77 y.o. female  YQM:[id number]  BMW:[id number]  DOB - [birth year]  Chief Complaint  Patient presents with   Hospitalization Follow-up    No questions or concerns       Subjective:   Emily Hamilton is a 77 y.o. female here today for a follow up visit From ED visit 03/03/2023 for chest pain. No further CP.  She has a cardiologist.  Troponins were normal.  She is c/o B breast pain for 15 days and wants mammo.  She has not felt any lump/mass.  She had normal mammo 08/2022 but is insistent she needs one.  She denies muscle soreness.  No vaginal bleeding or other hormonal symptoms.    From ED note: H/o diabetes and Afib Xarelto presented with 3 weeks of chest pain.  Patient is unsure of what elicits her symptoms but states that the chest pain goes to her back and she feels generally weak.  Patient states she has not have history of enlarged aorta and that she only has A-fib with hypertension.  Patient states that she has been taking her Xarelto and denies any missed doses.  Patient denies any fevers, nausea vomiting, shortness of breath, changes in sensation/motor skills, dysuria, bowel changes, neck pain, vision changes, headache.  Patient states that she has been able to eat and drink without issue and also walk without issue.  Patient denies any recent travel/hospitalization/surgery, personal history of cancer, estrogen use.  Symptoms and not elicited after eating food and patient denies any sick contacts.   EKG: Atrial fibrillation T wave abnormality, consider inferolateral ischemia Abnormal ECG When compared with ECG of 11-Jun-2022 16:10, PREVIOUS ECG IS PRESENT No acute changes TWI in inferior and lateral leads not new Confirmed by Derwood Kaplan [phone]) on 03/03/2023 11:15:29 AM   Plan: On exam patient was in no acute distress stable vitals. Patient's physical was  remarkable for regular heart rate indicative of A-fib however was not in A-fib with RVR and the rest of her exam was unremarkable.  Patient's labs and imaging from triage were ultimately reassuring however awaiting delta troponin but will add on COVID as patient is endorsing generalized fatigue as well.  Doubt aortic dissection given patient's presentation and how comfortable she looks along with not having previous history with reassuring physical exam.  Patient stable at this time.  Patient's labs came back reassuring including delta troponin and COVID-negative.  I recheck patient's vitals are still stable and patient was resting comfortably in the bed.  I spoke to the patient and we had shared decision making in which the patient agreed that she would like to be discharged and follow-up with her cardiologist.  We spoke about if symptoms are change or worsen to return to ER and we may do a further workup.  Encouraged patient to continue using her Tylenol every 6 hours as needed for pain and will prescribe lidocaine patches if patient's back pain is MSK.  No problems updated.  ALLERGIES: Allergies  Allergen Reactions   Lisinopril Other (See Comments)    angioedema    PAST MEDICAL HISTORY: Past Medical History:  Diagnosis Date   Controlled type 2 diabetes mellitus (HCC)    Family history of diabetes mellitus    Hyperlipidemia    Hypertension    Osteopenia    Status post right knee replacement     MEDICATIONS  AT HOME: Prior to Admission medications   Medication Sig Start Date End Date Taking? Authorizing Provider  amLODipine (NORVASC) 5 MG tablet Take 1 tablet (5 mg total) by mouth daily. 12/24/22  Yes Marcine Matar, MD  aspirin EC 81 MG tablet daily.   Yes [provider]  atorvastatin (LIPITOR) 40 MG tablet TOME UNA TABLETA TODOS LOS DIAS 12/24/22  Yes Marcine Matar, MD  cetirizine (ZYRTEC ALLERGY) 10 MG tablet Take 1 tablet (10 mg total) by mouth daily as needed for  allergies. 08/29/22  Yes Benjiman Core, MD  hydrochlorothiazide (MICROZIDE) 12.5 MG capsule Take 1 capsule (12.5 mg total) by mouth daily. TOME UNA CAPSULA TODOS LOS DIAS 12/24/22  Yes Marcine Matar, MD  isosorbide mononitrate (IMDUR) 30 MG 24 hr tablet TAKE 1/2 OF A TABLET (15 MG TOTAL) POR VIA ORAL A DIARIO 01/05/23  Yes Marcine Matar, MD  lidocaine (LIDODERM) 5 % Place 1 patch onto the skin daily. Remove & Discard patch within 12 hours or as directed by MD 03/03/23  Yes Schuman, Beverly Gust, PA-C  lisinopril (ZESTRIL) 5 MG tablet daily.   Yes [provider]  metFORMIN (GLUCOPHAGE) 500 MG tablet Take 1 tablet (500 mg total) by mouth 2 (two) times daily with a meal. 12/24/22  Yes Marcine Matar, MD  pantoprazole (PROTONIX) 20 MG tablet TOME 1 TABLETA POR VIA ORAL TODOS LOS DIAS 01/05/23  Yes Marcine Matar, MD  rivaroxaban (XARELTO) 20 MG TABS tablet Take 1 tablet (20 mg total) by mouth daily with supper. 07/11/22  Yes Marcine Matar, MD    ROS: Neg HEENT Neg resp Neg cardiac Neg GI Neg GU Neg MS Neg psych Neg neuro  Objective:   Vitals:   03/11/23 0903  BP: 121/77  Pulse: 76  SpO2: 96%  Weight: 174 lb 12.8 oz (79.3 kg)  Height: 4\' 7"  (1.397 m)   Exam General appearance : Awake, alert, not in any distress. Speech Clear. Not toxic looking HEENT: Atraumatic and Normocephalic Neck: Supple, no JVD. No cervical lymphadenopathy.  Chest: Good air entry bilaterally, CTAB.  No rales/rhonchi/wheezing CVS: S1 S2 regular, no murmurs.  Extremities: B/L Lower Ext shows no edema, both legs are warm to touch Neurology: Awake alert, and oriented X 3, CN II-XII intact, Non focal Skin: No Rash  Data Review Lab Results  Component Value Date   HGBA1C 6.0 11/06/2022   HGBA1C 6.3 07/11/2022   HGBA1C 6.5 03/28/2021    Assessment & Plan   1. Type 2 diabetes mellitus without complication, without long-term current use of insulin (HCC) Continue current regimen -  Glucose (CBG)  2. Language barrier AM interpreters "Norva Pavlov" used and additional time performing visit was required.   3. Breast pain - MM 3D DIAGNOSTIC MAMMOGRAM BILATERAL BREAST; Future  4. Encounter for examination following treatment at hospital  5. Other chest pain Has cardiology.  W/up neg in ED.  CP warneings-call 911; patient verbalizes understanding    Return for regualr appt next week.  The patient was given clear instructions to go to ER or return to medical center if symptoms don't improve, worsen or new problems develop. The patient verbalized understanding. The patient was told to call to get lab results if they haven't heard anything in the next week.      [place]ian [place], PA-C [facility] [place], [place] [phone]   03/11/2023, 9:19 AM

## 2023-03-12 ENCOUNTER — Telehealth: Payer: Self-pay

## 2023-03-12 NOTE — Telephone Encounter (Signed)
Transition Care Management Unsuccessful Follow-up Telephone Call  Date of discharge and from where:  [facility]  Attempts:  2nd Attempt  Reason for unsuccessful TCM follow-up call:  Left voice message   Lenard Forth [facility]  [facility], [facility] Guide, Phone: [phone] Website: [email or web address]

## 2023-03-19 ENCOUNTER — Other Ambulatory Visit: Payer: Self-pay

## 2023-03-19 ENCOUNTER — Ambulatory Visit: Payer: Medicare HMO | Attending: Internal Medicine | Admitting: Internal Medicine

## 2023-03-19 ENCOUNTER — Encounter: Payer: Self-pay | Admitting: Internal Medicine

## 2023-03-19 DIAGNOSIS — Z7984 Long term (current) use of oral hypoglycemic drugs: Secondary | ICD-10-CM | POA: Diagnosis not present

## 2023-03-19 DIAGNOSIS — Z1211 Encounter for screening for malignant neoplasm of colon: Secondary | ICD-10-CM

## 2023-03-19 DIAGNOSIS — I4821 Permanent atrial fibrillation: Secondary | ICD-10-CM

## 2023-03-19 DIAGNOSIS — E1159 Type 2 diabetes mellitus with other circulatory complications: Secondary | ICD-10-CM | POA: Diagnosis not present

## 2023-03-19 DIAGNOSIS — K760 Fatty (change of) liver, not elsewhere classified: Secondary | ICD-10-CM | POA: Diagnosis not present

## 2023-03-19 DIAGNOSIS — I152 Hypertension secondary to endocrine disorders: Secondary | ICD-10-CM | POA: Diagnosis not present

## 2023-03-19 DIAGNOSIS — E785 Hyperlipidemia, unspecified: Secondary | ICD-10-CM

## 2023-03-19 DIAGNOSIS — E1169 Type 2 diabetes mellitus with other specified complication: Secondary | ICD-10-CM

## 2023-03-19 DIAGNOSIS — Z23 Encounter for immunization: Secondary | ICD-10-CM

## 2023-03-19 DIAGNOSIS — E119 Type 2 diabetes mellitus without complications: Secondary | ICD-10-CM

## 2023-03-19 LAB — POCT GLYCOSYLATED HEMOGLOBIN (HGB A1C): HbA1c, POC (controlled diabetic range): 6.5 % (ref 0.0–7.0)

## 2023-03-19 LAB — GLUCOSE, POCT (MANUAL RESULT ENTRY): POC Glucose: 109 mg/dL — AB (ref 70–99)

## 2023-03-19 MED ORDER — ISOSORBIDE MONONITRATE ER 30 MG PO TB24
ORAL_TABLET | ORAL | 1 refills | Status: DC
Start: 2023-03-19 — End: 2023-12-17

## 2023-03-19 MED ORDER — ZOSTER VAC RECOMB ADJUVANTED 50 MCG/0.5ML IM SUSR
0.5000 mL | Freq: Once | INTRAMUSCULAR | 0 refills | Status: AC
Start: 2023-03-19 — End: 2023-03-20
  Filled 2023-03-19: qty 0.5, 1d supply, fill #0

## 2023-03-19 NOTE — Progress Notes (Signed)
Patient ID: Emily Hamilton, female    DOB: Nov 16, 1945  MRN: [id number]  CC: Diabetes (DM f/u. Franchot Erichsen medication pt was supposed to stop/)   Subjective: Emily Hamilton is a 77 y.o. female who presents for chronic ds management. Her concerns today include:  patient with history of DM type II, HL, HTN, osteopenia, permanent atrial fibrillation, Vit D def, hepatic steatosis   AMN Language interpreter used during this encounter. #[id number] Faster  Seen by physician assistant last week for ER follow-up and bilateral breast pain.  Mammogram ordered and has been scheduled for later this mth.  DM: Results for orders placed or performed in visit on 03/19/23  POCT glycosylated hemoglobin (Hb A1C)  Result Value Ref Range   Hemoglobin A1C     HbA1c POC (<> result, manual entry)     HbA1c, POC (prediabetic range)     HbA1c, POC (controlled diabetic range) 6.5 0.0 - 7.0 %  POCT glucose (manual entry)  Result Value Ref Range   POC Glucose 109 (A) 70 - 99 mg/dl  Not checking BS; no device Wgh stable, still doing ok with eating habits; eats lots of fruits and veggies. walks 10 mins every day up from 3 days a wk reported on last visit Taking and tolerating Metformin 500 mg BID  HL/hepatic steatosis/mild elev LFTs: Taking and tolerating atorvastatin 40 mg daily. Mild stable elev AST/ALT  HTN/atrial fibrillation:  taking meds including Xarelto 20 mg daily, isosorbide 30 mg half a tablet daily, HCTZ 12.5 mg daily, amlodipine 5 mg daily, lisinopril 5 mg daily  No bruising or bleeding on Xarelto.  Followed by cardiology Dr. Jacques Navy, last seen 10/2022.  HM: Due for Shingrix vaccine #2, COVID booster.  Will be due for c-scope 05/2023.    Patient Active Problem List   Diagnosis Date Noted   Chronic pain of left knee 04/24/2021   Class 3 severe obesity due to excess calories with serious comorbidity and body mass index (BMI) of 45.0 to 49.9 in adult [facility]) 04/23/2021   Atrial fibrillation  (HCC) 10/13/2020   Hyperlipidemia LDL goal <70 10/18/2018   Osteopenia 10/18/2018   Type 2 diabetes mellitus without complication, without long-term current use of insulin (HCC) 09/08/2018     Current Outpatient Medications on File Prior to Visit  Medication Sig Dispense Refill   amLODipine (NORVASC) 5 MG tablet Take 1 tablet (5 mg total) by mouth daily. 90 tablet 1   aspirin EC 81 MG tablet daily.     atorvastatin (LIPITOR) 40 MG tablet TOME UNA TABLETA TODOS LOS DIAS 90 tablet 1   cetirizine (ZYRTEC ALLERGY) 10 MG tablet Take 1 tablet (10 mg total) by mouth daily as needed for allergies. 14 tablet 0   hydrochlorothiazide (MICROZIDE) 12.5 MG capsule Take 1 capsule (12.5 mg total) by mouth daily. TOME UNA CAPSULA TODOS LOS DIAS 90 capsule 1   isosorbide mononitrate (IMDUR) 30 MG 24 hr tablet TAKE 1/2 OF A TABLET (15 MG TOTAL) POR VIA ORAL A DIARIO 45 tablet 0   lidocaine (LIDODERM) 5 % Place 1 patch onto the skin daily. Remove & Discard patch within 12 hours or as directed by MD 30 patch 0   lisinopril (ZESTRIL) 5 MG tablet daily.     metFORMIN (GLUCOPHAGE) 500 MG tablet Take 1 tablet (500 mg total) by mouth 2 (two) times daily with a meal. 180 tablet 1   pantoprazole (PROTONIX) 20 MG tablet TOME 1 TABLETA POR VIA ORAL TODOS LOS DIAS  90 tablet 0   rivaroxaban (XARELTO) 20 MG TABS tablet Take 1 tablet (20 mg total) by mouth daily with supper. 90 tablet 1   No current facility-administered medications on file prior to visit.    Allergies  Allergen Reactions   Lisinopril Other (See Comments)    angioedema    Social History   Socioeconomic History   Marital status: Married    Spouse name: Not on file   Number of children: Not on file   Years of education: Not on file   Highest education level: Not on file  Occupational History   Not on file  Tobacco Use   Smoking status: Never   Smokeless tobacco: Never  Vaping Use   Vaping status: Never Used  Substance and Sexual Activity    Alcohol use: Never   Drug use: Never   Sexual activity: Never  Other Topics Concern   Not on file  Social History Narrative   Not on file   Social Determinants of Health   Financial Resource Strain: Low Risk  (08/01/2022)   Overall Financial Resource Strain (CARDIA)    Difficulty of Paying Living Expenses: Not hard at all  Food Insecurity: No Food Insecurity (08/01/2022)   Hunger Vital Sign    Worried About Running Out of Food in the Last Year: Never true    Ran Out of Food in the Last Year: Never true  Transportation Needs: No Transportation Needs (06/17/2022)   PRAPARE - Administrator, Civil Service (Medical): No    Lack of Transportation (Non-Medical): No  Physical Activity: Insufficiently Active (08/01/2022)   Exercise Vital Sign    Days of Exercise per Week: 3 days    Minutes of Exercise per Session: 20 min  Stress: No Stress Concern Present (08/01/2022)   [employer] of Occupational Health - Occupational Stress Questionnaire    Feeling of Stress : Not at all  Social Connections: Moderately Isolated (08/01/2022)   Social Connection and Isolation Panel [NHANES]    Frequency of Communication with Friends and Family: More than three times a week    Frequency of Social Gatherings with Friends and Family: More than three times a week    Attends Religious Services: More than 4 times per year    Active Member of [employer] or Organizations: No    Attends Banker Meetings: Never    Marital Status: Separated  Intimate Partner Violence: Not At Risk (08/01/2022)   Humiliation, Afraid, Rape, and Kick questionnaire    Fear of Current or Ex-Partner: No    Emotionally Abused: No    Physically Abused: No    Sexually Abused: No    Family History  Problem Relation Age of Onset   Diabetes Mother    Glaucoma Mother    Breast cancer Neg Hx     Past Surgical History:  Procedure Laterality Date   BREAST EXCISIONAL BIOPSY Left    CHOLECYSTECTOMY     Right knee  replacement      ROS: Review of Systems Negative except as stated above  PHYSICAL EXAM: BP 118/74 (BP Location: Right Arm, Patient Position: Sitting, Cuff Size: Large)   Pulse 65   Temp 98.2 F (36.8 C) (Oral)   Ht 4\' 7"  (1.397 m)   Wt 175 lb (79.4 kg)   SpO2 98%   BMI 40.67 kg/m   Wt Readings from Last 3 Encounters:  03/19/23 175 lb (79.4 kg)  03/11/23 174 lb 12.8 oz (79.3 kg)  11/06/22  174 lb (78.9 kg)    Physical Exam  General appearance - alert, well appearing, and in no distress Mental status - normal mood, behavior, speech, dress, motor activity, and thought processes Neck - supple, no significant adenopathy Chest - clear to auscultation, no wheezes, rales or rhonchi, symmetric air entry Heart -heart sounds are irregularly irregular but rate controlled.  No murmurs, rubs, clicks or gallops Extremities - peripheral pulses normal, no pedal edema, no clubbing or cyanosis.  Positive varicose veins in both lower extremities.  Noted to have lipoma on dorsal surface of the right wrist.      Latest Ref Rng & Units 03/03/2023   11:30 AM 08/29/2022    8:35 AM 07/11/2022   10:13 AM  CMP  Glucose 70 - 99 mg/dL 295     BUN 8 - 23 mg/dL 11     Creatinine 6.21 - 1.00 mg/dL 3.08     Sodium 657 - 846 mmol/L 136     Potassium 3.5 - 5.1 mmol/L 3.9     Chloride 98 - 111 mmol/L 101     CO2 22 - 32 mmol/L 23     Calcium 8.9 - 10.3 mg/dL 9.1     Total Protein 6.0 - 8.5 g/dL  7.7  7.8   Total Bilirubin 0.0 - 1.2 mg/dL  0.5  0.4   Alkaline Phos 44 - 121 IU/L  103  112   AST 0 - 40 IU/L  49  53   ALT 0 - 32 IU/L  36  33    Lipid Panel     Component Value Date/Time   CHOL 177 07/11/2022 1013   TRIG 189 (H) 07/11/2022 1013   HDL 48 07/11/2022 1013   CHOLHDL 3.7 07/11/2022 1013   LDLCALC 97 07/11/2022 1013    CBC    Component Value Date/Time   WBC 6.7 03/03/2023 1130   RBC 4.69 03/03/2023 1130   HGB 13.8 03/03/2023 1130   HGB 14.2 03/28/2021 1020   HCT 42.7 03/03/2023  1130   HCT 41.4 03/28/2021 1020   PLT 178 03/03/2023 1130   PLT 214 03/28/2021 1020   MCV 91.0 03/03/2023 1130   MCV 85 03/28/2021 1020   MCH 29.4 03/03/2023 1130   MCHC 32.3 03/03/2023 1130   RDW 13.7 03/03/2023 1130   RDW 13.7 03/28/2021 1020   LYMPHSABS 1.7 12/17/2021 0934   LYMPHSABS 1.8 09/08/2018 1100   MONOABS 0.4 12/17/2021 0934   EOSABS 0.2 12/17/2021 0934   EOSABS 0.1 09/08/2018 1100   BASOSABS 0.0 12/17/2021 0934   BASOSABS 0.0 09/08/2018 1100    ASSESSMENT AND PLAN:  1. Type 2 diabetes mellitus with morbid obesity (HCC) At goal Encouraged to continue healthy eating; challenged to increase walking to 15 mins daily Continue Metformin - POCT glycosylated hemoglobin (Hb A1C) - POCT glucose (manual entry)  2. Diabetes mellitus treated with oral medication (HCC)   3. Hypertension associated with type 2 diabetes mellitus (HCC) At goal.  Continue Isosorbide 30 mg half a tablet daily, HCTZ 12.5 mg daily, amlodipine 5 mg daily, lisinopril 5 mg daily  - isosorbide mononitrate (IMDUR) 30 MG 24 hr tablet; TAKE 1/2 OF A TABLET (15 MG TOTAL) POR VIA ORAL A DIARIO  Dispense: 45 tablet; Refill: 1  4. Hyperlipidemia associated with type 2 diabetes mellitus (HCC) Continue atorvastatin 40 mg daily. - Hepatic Function Panel  5. Hepatic steatosis Discussed importance of weight loss. - Hepatic Function Panel  6. Permanent atrial fibrillation (HCC) On  Xarelto and tolerating without bruising or bleeding.  Rate sounds to be controlled on exam.  7. Need for shingles vaccine Given prescription to get Shingrix vaccine through our pharmacy - Zoster Vaccine Adjuvanted [facility]) injection; Inject 0.5 mLs into the muscle once for 1 dose.  Dispense: 0.5 mL; Refill: 0  8. Screening for colon cancer - Ambulatory referral to Gastroenterology    Patient was given the opportunity to ask questions.  Patient verbalized understanding of the plan and was able to repeat key elements of the  plan.   This documentation was completed using Paediatric nurse.  Any transcriptional errors are unintentional.  Orders Placed This Encounter  Procedures   POCT glycosylated hemoglobin (Hb A1C)   POCT glucose (manual entry)     Requested Prescriptions    No prescriptions requested or ordered in this encounter    No follow-ups on file.  Jonah Blue, MD, FACP

## 2023-03-20 LAB — HEPATIC FUNCTION PANEL
ALT: 31 IU/L (ref 0–32)
AST: 44 IU/L — ABNORMAL HIGH (ref 0–40)
Albumin: 4.2 g/dL (ref 3.8–4.8)
Alkaline Phosphatase: 95 IU/L (ref 44–121)
Bilirubin Total: 0.5 mg/dL (ref 0.0–1.2)
Bilirubin, Direct: 0.16 mg/dL (ref 0.00–0.40)
Total Protein: 7.4 g/dL (ref 6.0–8.5)

## 2023-03-26 ENCOUNTER — Other Ambulatory Visit: Payer: Medicare HMO

## 2023-04-10 ENCOUNTER — Other Ambulatory Visit: Payer: Medicare HMO

## 2023-06-20 ENCOUNTER — Other Ambulatory Visit: Payer: Self-pay | Admitting: Internal Medicine

## 2023-06-20 DIAGNOSIS — E1169 Type 2 diabetes mellitus with other specified complication: Secondary | ICD-10-CM

## 2023-06-20 DIAGNOSIS — E1159 Type 2 diabetes mellitus with other circulatory complications: Secondary | ICD-10-CM

## 2023-06-22 NOTE — Telephone Encounter (Signed)
Requested Prescriptions  Pending Prescriptions Disp Refills   amLODipine (NORVASC) 5 MG tablet [Pharmacy Med Name: AMLODIPINE BESYLATE 5 MG TAB] 90 tablet 0    Sig: TAKE 1 TABLET (5 MG TOTAL) BY MOUTH DAILY.     Cardiovascular: Calcium Channel Blockers 2 Passed - 06/20/2023 10:01 AM      Passed - Last BP in normal range    BP Readings from Last 1 Encounters:  03/19/23 118/74         Passed - Last Heart Rate in normal range    Pulse Readings from Last 1 Encounters:  03/19/23 65         Passed - Valid encounter within last 6 months    Recent Outpatient Visits           3 months ago Type 2 diabetes mellitus with morbid obesity (HCC)   [place] [facility] - A Dept Of [place]. [facility] Jonah Blue B, MD   3 months ago Type 2 diabetes mellitus without complication, without long-term current use of insulin (HCC)   [place] [facility] - A Dept Of [place]. [facility], Marylene Land M, [place]   6 months ago Medication management   [place] [facility] [place] - A Dept Of [place]. [facility] Lois Huxley, Savannah L, RPH-CPP   7 months ago Type 2 diabetes mellitus with morbid obesity [facility])   [place] [facility] - A Dept Of [place]. [facility] Jonah Blue B, MD   11 months ago Type 2 diabetes mellitus with morbid obesity [facility])   [place] [facility] - A Dept Of [place]. [facility] Marcine Matar, MD       Future Appointments             In 1 month Laural Benes Binnie Rail, MD [facility] [place] [facility] - A Dept Of Eligha Bridegroom. [facility]             atorvastatin (LIPITOR) 40 MG tablet [Pharmacy Med Name: ATORVASTATIN 40 MG TABLET] 90 tablet 0    Sig: TOME 1 TABLETA POR VIA ORAL TODOS LOS DIAS     Cardiovascular:  Antilipid - Statins Failed - 06/20/2023 10:01 AM      Failed - Lipid Panel in normal range within the last 12 months    Cholesterol, Total   Date Value Ref Range Status  07/11/2022 177 100 - 199 mg/dL Final   LDL Chol Calc (NIH)  Date Value Ref Range Status  07/11/2022 97 0 - 99 mg/dL Final   HDL  Date Value Ref Range Status  07/11/2022 48 >39 mg/dL Final   Triglycerides  Date Value Ref Range Status  07/11/2022 189 (H) 0 - 149 mg/dL Final         Passed - Patient is not pregnant      Passed - Valid encounter within last 12 months    Recent Outpatient Visits           3 months ago Type 2 diabetes mellitus with morbid obesity (HCC)   [place] [facility] - A Dept Of [place]. [facility] Jonah Blue B, MD   3 months ago Type 2 diabetes mellitus without complication, without long-term current use of insulin (HCC)    [facility] - A Dept Of [place]. [facility], Marylene Land [place], [place]   6 months  ago Medication management   [place] [facility] [place] - A Dept Of [place]. [facility] Lois Huxley, Radisson L, RPH-CPP   7 months ago Type 2 diabetes mellitus with morbid obesity [facility])   [place] [facility] - A Dept Of [place]. [facility] Jonah Blue B, MD   11 months ago Type 2 diabetes mellitus with morbid obesity [facility])   [place] [facility] - A Dept Of [place]. [facility] Marcine Matar, MD       Future Appointments             In 1 month Laural Benes Binnie Rail, MD [facility] [place] - A Dept Of [place]igh[place] Bridegroom. [facility]             metFORMIN (GLUCOPHAGE) 500 MG tablet [Pharmacy Med Name: METFORMIN HCL 500 MG TABLET] 180 tablet 0    Sig: TAKE 1 TABLET BY MOUTH 2 TIMES DAILY WITH A MEAL.     Endocrinology:  Diabetes - Biguanides Failed - 06/20/2023 10:01 AM      Failed - B12 Level in normal range and within 720 days    Vitamin B-12  Date Value Ref Range Status  01/20/2019 328 232 - 1,245 pg/mL Final         Failed - CBC within normal limits and completed in the last  12 months    WBC  Date Value Ref Range Status  03/03/2023 6.7 4.0 - 10.5 K/uL Final   RBC  Date Value Ref Range Status  03/03/2023 4.69 3.87 - 5.11 MIL/uL Final   Hemoglobin  Date Value Ref Range Status  03/03/2023 13.8 12.0 - 15.0 g/dL Final  16/04/9603 54.0 11.1 - 15.9 g/dL Final   HCT  Date Value Ref Range Status  03/03/2023 42.7 36.0 - 46.0 % Final   Hematocrit  Date Value Ref Range Status  03/28/2021 41.4 34.0 - 46.6 % Final   MCHC  Date Value Ref Range Status  03/03/2023 32.3 30.0 - 36.0 g/dL Final   [facility]  Date Value Ref Range Status  03/03/2023 29.4 26.0 - 34.0 pg Final   MCV  Date Value Ref Range Status  03/03/2023 91.0 80.0 - 100.0 fL Final  03/28/2021 85 79 - 97 fL Final   No results found for: "PLTCOUNTKUC", "LABPLAT", "POCPLA" RDW  Date Value Ref Range Status  03/03/2023 13.7 11.5 - 15.5 % Final  03/28/2021 13.7 11.7 - 15.4 % Final         Passed - Cr in normal range and within 360 days    Creatinine, Ser  Date Value Ref Range Status  03/03/2023 0.69 0.44 - 1.00 mg/dL Final         Passed - HBA1C is between 0 and 7.9 and within 180 days    HbA1c, POC (prediabetic range)  Date Value Ref Range Status  09/08/2018 6.4 5.7 - 6.4 % Final   HbA1c, POC (controlled diabetic range)  Date Value Ref Range Status  03/19/2023 6.5 0.0 - 7.0 % Final         Passed - eGFR in normal range and within 360 days    GFR calc Af Amer  Date Value Ref Range Status  05/28/2020 101 >59 mL/min/1.73 Final    Comment:    **In accordance with recommendations from the NKF-ASN Task force,**   [facility] is in the process of updating its eGFR calculation to the   2021 CKD-EPI  creatinine equation that estimates kidney function   without a race variable.    GFR, Estimated  Date Value Ref Range Status  03/03/2023 >60 >60 mL/min Final    Comment:    (NOTE) Calculated using the CKD-EPI Creatinine Equation (2021)    eGFR  Date Value Ref Range Status  03/28/2021 88 >59  mL/min/1.73 Final         Passed - Valid encounter within last 6 months    Recent Outpatient Visits           3 months ago Type 2 diabetes mellitus with morbid obesity (HCC)   [place] [facility] A Dept Of [place]. [facility] Jonah Blue B, MD   3 months ago Type 2 diabetes mellitus without complication, without long-term current use of insulin (HCC)   [place] [facility] - A Dept Of [place]. [facility], Marylene Land M, [place]   6 months ago Medication management   [place] [facility] - A Dept Of [place]. [facility] Lois Huxley, Merrill L, RPH-CPP   7 months ago Type 2 diabetes mellitus with morbid obesity [facility])   [place] [facility] - A Dept Of [place]. [facility] Jonah Blue B, MD   11 months ago Type 2 diabetes mellitus with morbid obesity [facility])   [place] [facility] - A Dept Of [place]. [facility] Marcine Matar, MD       Future Appointments             In 1 month Laural Benes Binnie Rail, MD [facility] [place] - A Dept Of Eligha Bridegroom. [facility]

## 2023-06-23 ENCOUNTER — Telehealth: Payer: Self-pay

## 2023-06-23 DIAGNOSIS — Z7901 Long term (current) use of anticoagulants: Secondary | ICD-10-CM | POA: Diagnosis not present

## 2023-06-23 DIAGNOSIS — Z860101 Personal history of adenomatous and serrated colon polyps: Secondary | ICD-10-CM | POA: Diagnosis not present

## 2023-06-23 DIAGNOSIS — I4891 Unspecified atrial fibrillation: Secondary | ICD-10-CM | POA: Diagnosis not present

## 2023-06-23 NOTE — Telephone Encounter (Signed)
..     Pre-operative Risk Assessment    Patient Name: Emily Hamilton  DOB: 08-31-1945 MRN: [id number]      Request for Surgical Clearance    Procedure:   COLONOSCOPY  Date of Surgery:  Clearance 07/30/23                                 Surgeon:  DR Dulce Sellar Surgeon's Group or Practice Name:   [facility] Phone number:  [phone] Fax number:  [phone]   Type of Clearance Requested:   - Medical  - Pharmacy:  Hold Aspirin and Rivaroxaban (Xarelto) HOLD FOR 3 DAYS PRIOR   Type of Anesthesia:   PROPOFOL   Additional requests/questions:   LAST O/V 11/11/22  Signed, Renee Ramus   06/23/2023, 2:09 PM

## 2023-06-23 NOTE — Telephone Encounter (Signed)
Patient was advised to stop aspirin at last office visit with Dr. Jacques Navy on 10/15/22. Would recommend APP verify that she is not taking aspirin for a non-cardiac indication at time of virtual visit.

## 2023-06-25 ENCOUNTER — Telehealth: Payer: Self-pay | Admitting: *Deleted

## 2023-06-25 NOTE — Telephone Encounter (Signed)
Patient with diagnosis of PAF on Eliquis for anticoagulation.    Procedure: COLONOSCOPY  Date of procedure: 07/30/2023   CHA2DS2-VASc Score = 5   This indicates a 7.2% annual risk of stroke. The patient's score is based upon: CHF History: 0 HTN History: 1 Diabetes History: 1 Stroke History: 0 Vascular Disease History: 0 Age Score: 2 Gender Score: 1     CrCl 88 mL/min (SrCr 0.69 03/03/2023) Platelet count 178 K (03/03/2023)    Per office protocol, patient can hold Eliquis for 2 days prior to procedure.    **This guidance is not considered finalized until pre-operative APP has relayed final recommendations.**

## 2023-06-25 NOTE — Telephone Encounter (Signed)
   Name: Emily Hamilton  DOB: [birth year]  MRN: [id number]  Primary Cardiologist: Parke Poisson, MD   Preoperative team, please contact this patient and set up a phone call appointment for further preoperative risk assessment. Please obtain consent and complete medication review. Thank you for your help. Last seen in 10/2022  I confirm that guidance regarding antiplatelet and oral anticoagulation therapy has been completed and, if necessary, noted below.  Per office protocol, patient can hold Eliquis for 2 days prior to procedure.   I also confirmed the patient resides in the state of [place]. As per [facility] Board telemedicine laws, the patient must reside in the state in which the provider is licensed.   Joni Reining, NP 06/25/2023, 11:46 AM [facility]

## 2023-06-25 NOTE — Telephone Encounter (Signed)
Pt has been scheduled tele preop appt 12/231/24. Med rec and consent are done. Please call the pt using [employer] [phone].

## 2023-06-25 NOTE — Telephone Encounter (Signed)
Pt has been scheduled tele preop appt 12/231/24. Med rec and consent are done. Please call the pt using [employer] [facility] [phone].      Patient Consent for Virtual Visit        Emily Hamilton has provided verbal consent on 06/25/2023 for a virtual visit (video or telephone).   CONSENT FOR VIRTUAL VISIT FOR:  Emily Hamilton  By participating in this virtual visit I agree to the following:  I hereby voluntarily request, consent and authorize [facility] and its employed or contracted physicians, physician assistants, nurse practitioners or other licensed health care professionals (the Practitioner), to provide me with telemedicine health care services (the "Services") as deemed necessary by the treating Practitioner. I acknowledge and consent to receive the Services by the Practitioner via telemedicine. I understand that the telemedicine visit will involve communicating with the Practitioner through live audiovisual communication technology and the disclosure of certain medical information by electronic transmission. I acknowledge that I have been given the opportunity to request an in-person assessment or other available alternative prior to the telemedicine visit and am voluntarily participating in the telemedicine visit.  I understand that I have the right to withhold or withdraw my consent to the use of telemedicine in the course of my care at any time, without affecting my right to future care or treatment, and that the Practitioner or I may terminate the telemedicine visit at any time. I understand that I have the right to inspect all information obtained and/or recorded in the course of the telemedicine visit and may receive copies of available information for a reasonable fee.  I understand that some of the potential risks of receiving the Services via telemedicine include:  Delay or interruption in medical evaluation due to technological equipment  failure or disruption; Information transmitted may not be sufficient (e.g. poor resolution of images) to allow for appropriate medical decision making by the Practitioner; and/or  In rare instances, security protocols could fail, causing a breach of personal health information.  Furthermore, I acknowledge that it is my responsibility to provide information about my medical history, conditions and care that is complete and accurate to the best of my ability. I acknowledge that Practitioner's advice, recommendations, and/or decision may be based on factors not within their control, such as incomplete or inaccurate data provided by me or distortions of diagnostic images or specimens that may result from electronic transmissions. I understand that the practice of medicine is not an exact science and that Practitioner makes no warranties or guarantees regarding treatment outcomes. I acknowledge that a copy of this consent can be made available to me via my patient portal [facility] MyChart), or I can request a printed copy by calling the office of [facility].    I understand that my insurance will be billed for this visit.   I have read or had this consent read to me. I understand the contents of this consent, which adequately explains the benefits and risks of the Services being provided via telemedicine.  I have been provided ample opportunity to ask questions regarding this consent and the Services and have had my questions answered to my satisfaction. I give my informed consent for the services to be provided through the use of telemedicine in my medical care

## 2023-07-14 ENCOUNTER — Ambulatory Visit: Payer: Medicare HMO | Attending: Physician Assistant

## 2023-07-14 DIAGNOSIS — Z01810 Encounter for preprocedural cardiovascular examination: Secondary | ICD-10-CM | POA: Diagnosis not present

## 2023-07-14 NOTE — Progress Notes (Signed)
 Virtual Visit via Telephone Note   Because of Emily Hamilton's co-morbid illnesses, she is at least at moderate risk for complications without adequate follow up.  This format is felt to be most appropriate for this patient at this time.  The patient did not have access to video technology/had technical difficulties with video requiring transitioning to audio format only (telephone).  All issues noted in this document were discussed and addressed.  No physical exam could be performed with this format.  Please refer to the patient's chart for her consent to telehealth for [facility].  Evaluation Performed:  Preoperative cardiovascular risk assessment _____________   Date:  07/14/2023   Patient ID:  Emily Hamilton, DOB 11/17/1945, MRN [id number] Patient Location:  Home Provider location:   Office  Primary Care Provider:  Vicci Barnie NOVAK, MD Primary Cardiologist:  Soyla DELENA Merck, MD  Chief Complaint / Patient Profile   77 y.o. y/o female with a h/o diabetes mellitus, HLD, HTN, chest pain and permanent atrial fibrillation who is pending colonoscopy and presents today for telephonic preoperative cardiovascular risk assessment.  History of Present Illness    Emily Hamilton is a 77 y.o. female who presents via audio/video conferencing for a telehealth visit today.  Pt was last seen in cardiology clinic on 10/15/2022 by Dr. Merck.  At that time Emily Hamilton was doing well.  The patient is now pending procedure as outlined above. Since her last visit, she tells me that she has not had any symptoms related to her heart including chest pain or shortness of breath.  Interpreter was used during her entire telephone encounter today.  She is able to walk 1-2 blocks and does household tasks therefore she surpasses 4 METS on the DASI.  She does tell me that she is currently still taking aspirin  81 mg daily along with her Xarelto .   Per office protocol,  patient can hold Xarelto  for 2 days prior to procedure. We would prefer you to continue ASA 81mg  throughout the procedure.   We discussed her upcoming primary care appointment on 1/9 and I reminded her of appointment time and date.  Past Medical History    Past Medical History:  Diagnosis Date   Controlled type 2 diabetes mellitus (HCC)    Family history of diabetes mellitus    Hyperlipidemia    Hypertension    Osteopenia    Status post right knee replacement    Past Surgical History:  Procedure Laterality Date   BREAST EXCISIONAL BIOPSY Left    CHOLECYSTECTOMY     Right knee replacement      Allergies  Allergies  Allergen Reactions   Lisinopril  Other (See Comments)    angioedema    Home Medications    Prior to Admission medications   Medication Sig Start Date End Date Taking? Authorizing Provider  amLODipine  (NORVASC ) 5 MG tablet TAKE 1 TABLET (5 MG TOTAL) BY MOUTH DAILY. 06/22/23   Vicci Barnie NOVAK, MD  aspirin  EC 81 MG tablet daily.    [provider]  atorvastatin  (LIPITOR) 40 MG tablet TOME 1 TABLETA POR VIA ORAL TODOS LOS DIAS 06/22/23   Vicci Barnie NOVAK, MD  cetirizine  (ZYRTEC  ALLERGY) 10 MG tablet Take 1 tablet (10 mg total) by mouth daily as needed for allergies. 08/29/22   Patsey Lot, MD  hydrochlorothiazide  (MICROZIDE ) 12.5 MG capsule Take 1 capsule (12.5 mg total) by mouth daily. TOME UNA CAPSULA TODOS LOS DIAS 12/24/22  Vicci Barnie NOVAK, MD  isosorbide  mononitrate (IMDUR ) 30 MG 24 hr tablet TAKE 1/2 OF A TABLET (15 MG TOTAL) POR VIA ORAL A DIARIO 03/19/23   Vicci Barnie NOVAK, MD  lidocaine  (LIDODERM ) 5 % Place 1 patch onto the skin daily. Remove & Discard patch within 12 hours or as directed by MD 03/03/23   Victor Lynwood DASEN, PA-C  lisinopril  (ZESTRIL ) 5 MG tablet daily.    [provider]  metFORMIN  (GLUCOPHAGE ) 500 MG tablet TAKE 1 TABLET BY MOUTH 2 TIMES DAILY WITH A MEAL. 06/22/23   Vicci Barnie NOVAK, MD  pantoprazole  (PROTONIX ) 20  MG tablet TOME 1 TABLETA POR VIA ORAL TODOS LOS DIAS 01/05/23   Vicci Barnie NOVAK, MD  rivaroxaban  (XARELTO ) 20 MG TABS tablet Take 1 tablet (20 mg total) by mouth daily with supper. 07/11/22   Vicci Barnie NOVAK, MD    Physical Exam    Vital Signs:  Emily Hamilton does not have vital signs available for review today.  Given telephonic nature of communication, physical exam is limited. AAOx3. NAD. Normal affect.  Speech and respirations are unlabored.  Accessory Clinical Findings    None  Assessment & Plan    1.  Preoperative Cardiovascular Risk Assessment:  [name]'s perioperative risk of a major cardiac event is 0.4% according to the Revised Cardiac Risk Index (RCRI).  Therefore, she is at low risk for perioperative complications.   Her functional capacity is good at 5.07 METs according to the [facility] Activity Status Index (DASI). Recommendations: According to ACC/AHA guidelines, no further cardiovascular testing needed.  The patient may proceed to surgery at acceptable risk.   Antiplatelet and/or Anticoagulation Recommendations: The patient should remain on Aspirin  without interruption.   Xarelto  (Rivaroxaban ) can be held for 2 days prior to surgery.  Please resume post op when felt to be safe.     A copy of this note will be routed to requesting surgeon.  Time:   Today, I have spent 12 minutes with the patient with telehealth technology discussing medical history, symptoms, and management plan.     Orren LOISE Fabry, PA-C  07/14/2023, 8:42 AM

## 2023-07-23 ENCOUNTER — Ambulatory Visit: Payer: Medicare HMO | Attending: Internal Medicine | Admitting: Internal Medicine

## 2023-07-23 ENCOUNTER — Encounter: Payer: Self-pay | Admitting: Internal Medicine

## 2023-07-23 DIAGNOSIS — R140 Abdominal distension (gaseous): Secondary | ICD-10-CM

## 2023-07-23 DIAGNOSIS — R5383 Other fatigue: Secondary | ICD-10-CM | POA: Diagnosis not present

## 2023-07-23 DIAGNOSIS — Z6841 Body Mass Index (BMI) 40.0 and over, adult: Secondary | ICD-10-CM

## 2023-07-23 DIAGNOSIS — E1159 Type 2 diabetes mellitus with other circulatory complications: Secondary | ICD-10-CM | POA: Diagnosis not present

## 2023-07-23 DIAGNOSIS — R1013 Epigastric pain: Secondary | ICD-10-CM

## 2023-07-23 DIAGNOSIS — E66813 Obesity, class 3: Secondary | ICD-10-CM

## 2023-07-23 DIAGNOSIS — E785 Hyperlipidemia, unspecified: Secondary | ICD-10-CM

## 2023-07-23 DIAGNOSIS — Z1211 Encounter for screening for malignant neoplasm of colon: Secondary | ICD-10-CM

## 2023-07-23 DIAGNOSIS — I152 Hypertension secondary to endocrine disorders: Secondary | ICD-10-CM

## 2023-07-23 DIAGNOSIS — E119 Type 2 diabetes mellitus without complications: Secondary | ICD-10-CM

## 2023-07-23 DIAGNOSIS — E1169 Type 2 diabetes mellitus with other specified complication: Secondary | ICD-10-CM

## 2023-07-23 DIAGNOSIS — I4821 Permanent atrial fibrillation: Secondary | ICD-10-CM | POA: Diagnosis not present

## 2023-07-23 DIAGNOSIS — Z7984 Long term (current) use of oral hypoglycemic drugs: Secondary | ICD-10-CM

## 2023-07-23 LAB — GLUCOSE, POCT (MANUAL RESULT ENTRY): POC Glucose: 108 mg/dL — AB (ref 70–99)

## 2023-07-23 LAB — POCT GLYCOSYLATED HEMOGLOBIN (HGB A1C): HbA1c, POC (controlled diabetic range): 6.8 % (ref 0.0–7.0)

## 2023-07-23 MED ORDER — PANTOPRAZOLE SODIUM 20 MG PO TBEC
20.0000 mg | DELAYED_RELEASE_TABLET | Freq: Every day | ORAL | 1 refills | Status: AC
Start: 2023-07-23 — End: ?

## 2023-07-23 MED ORDER — ATORVASTATIN CALCIUM 40 MG PO TABS
ORAL_TABLET | ORAL | 0 refills | Status: DC
Start: 2023-07-23 — End: 2023-12-17

## 2023-07-23 MED ORDER — RIVAROXABAN 20 MG PO TABS
20.0000 mg | ORAL_TABLET | Freq: Every day | ORAL | 1 refills | Status: AC
Start: 2023-07-23 — End: ?

## 2023-07-23 MED ORDER — AMLODIPINE BESYLATE 5 MG PO TABS
5.0000 mg | ORAL_TABLET | Freq: Every day | ORAL | 0 refills | Status: DC
Start: 2023-07-23 — End: 2023-12-17

## 2023-07-23 NOTE — Progress Notes (Signed)
 Patient ID: Dniyah Grant, female    DOB: 22-Jul-1945  MRN: [id number]  CC: Diabetes (DM f/u. Raguel about weight gain - unable to loose weight/Already received flu vax.)   Subjective: Emily Hamilton is a 78 y.o. female who presents for chronic ds management. Her concerns today include:  patient with history of DM type II, HL, HTN, osteopenia, permanent atrial fibrillation, Vit D def, hepatic steatosis   AMN Language interpreter used during this encounter. #[id number], Silvia  Patient did not bring medicines with her today.  DM: Results for orders placed or performed in visit on 07/23/23  POCT glucose (manual entry)   Collection Time: 07/23/23 12:26 PM  Result Value Ref Range   POC Glucose 108 (A) 70 - 99 mg/dl  POCT glycosylated hemoglobin (Hb A1C)   Collection Time: 07/23/23  5:07 PM  Result Value Ref Range   Hemoglobin A1C     HbA1c POC (<> result, manual entry)     HbA1c, POC (prediabetic range)     HbA1c, POC (controlled diabetic range) 6.8 0.0 - 7.0 %  A1C is 6.6 Confirms taking Metformin  500 mg BID as prescribed Does not check BS. Does well with eating habits - not much bread, no tortias and not much meat Still walking daily for 10 mins but tires easily at times.  She had an echo done 11/2022 that revealed normal EF and no significant valvular abnormalities. No LE edema.  No CP Taking and tolerating atorvastatin  40 mg daily. Mild stable elev AST/ALT persist C/o feeling bloated in upper abdomen all the time; worse when she eats.  No pain, N/V, reflux symptoms, diarrhea.  Supposed to be on pantoprazole  20 mg daily.  She is not sure whether she is taking.  HTN/A.fib: taking meds including Xarelto  20 mg daily, isosorbide  30 mg half a tablet daily, HCTZ 12.5 mg daily, amlodipine  5 mg daily, lisinopril  5 mg daily.  Did not take meds as yet this a.m.  Limits salt  No CP/LE edema/palpitations/bruising/bleeding  HM: Referred for colonoscopy.  Patient was called by  [facility]: [facility] gastroenterology.  Patient states that she canceled the appointment because she was fatigue.  I have encouraged her to call and reschedule. Patient Active Problem List   Diagnosis Date Noted   Chronic pain of left knee 04/24/2021   Class 3 severe obesity due to excess calories with serious comorbidity and body mass index (BMI) of 45.0 to 49.9 in adult [facility]) 04/23/2021   Atrial fibrillation (HCC) 10/13/2020   Hyperlipidemia LDL goal <70 10/18/2018   Osteopenia 10/18/2018   Type 2 diabetes mellitus without complication, without long-term current use of insulin (HCC) 09/08/2018     Current Outpatient Medications on File Prior to Visit  Medication Sig Dispense Refill   aspirin  EC 81 MG tablet daily.     cetirizine  (ZYRTEC  ALLERGY) 10 MG tablet Take 1 tablet (10 mg total) by mouth daily as needed for allergies. 14 tablet 0   hydrochlorothiazide  (MICROZIDE ) 12.5 MG capsule Take 1 capsule (12.5 mg total) by mouth daily. TOME UNA CAPSULA TODOS LOS DIAS 90 capsule 1   metFORMIN  (GLUCOPHAGE ) 500 MG tablet TAKE 1 TABLET BY MOUTH 2 TIMES DAILY WITH A MEAL. 180 tablet 0   isosorbide  mononitrate (IMDUR ) 30 MG 24 hr tablet TAKE 1/2 OF A TABLET (15 MG TOTAL) POR VIA ORAL A DIARIO (Patient not taking: Reported on 07/23/2023) 45 tablet 1   lidocaine  (LIDODERM ) 5 % Place 1 patch onto the skin daily. Remove &  Discard patch within 12 hours or as directed by MD 30 patch 0   lisinopril  (ZESTRIL ) 5 MG tablet daily.     No current facility-administered medications on file prior to visit.    Allergies  Allergen Reactions   Lisinopril  Other (See Comments)    angioedema    Social History   Socioeconomic History   Marital status: Married    Spouse name: Not on file   Number of children: Not on file   Years of education: Not on file   Highest education level: Not on file  Occupational History   Not on file  Tobacco Use   Smoking status: Never   Smokeless tobacco: Never  Vaping Use   Vaping status:  Never Used  Substance and Sexual Activity   Alcohol use: Never   Drug use: Never   Sexual activity: Never  Other Topics Concern   Not on file  Social History Narrative   Not on file   Social Drivers of Health   Financial Resource Strain: Low Risk  (07/23/2023)   Overall Financial Resource Strain (CARDIA)    Difficulty of Paying Living Expenses: Not hard at all  Food Insecurity: No Food Insecurity (07/23/2023)   Hunger Vital Sign    Worried About Running Out of Food in the Last Year: Never true    Ran Out of Food in the Last Year: Never true  Transportation Needs: No Transportation Needs (07/23/2023)   PRAPARE - Administrator, Civil Service (Medical): No    Lack of Transportation (Non-Medical): No  Physical Activity: Insufficiently Active (07/23/2023)   Exercise Vital Sign    Days of Exercise per Week: 3 days    Minutes of Exercise per Session: 20 min  Stress: No Stress Concern Present (07/23/2023)   [employer] of Occupational Health - Occupational Stress Questionnaire    Feeling of Stress : Not at all  Social Connections: Socially Isolated (07/23/2023)   Social Connection and Isolation Panel [NHANES]    Frequency of Communication with Friends and Family: Once a week    Frequency of Social Gatherings with Friends and Family: Once a week    Attends Religious Services: More than 4 times per year    Active Member of [employer] or Organizations: No    Attends Banker Meetings: Never    Marital Status: Separated  Intimate Partner Violence: Not At Risk (07/23/2023)   Humiliation, Afraid, Rape, and Kick questionnaire    Fear of Current or Ex-Partner: No    Emotionally Abused: No    Physically Abused: No    Sexually Abused: No    Family History  Problem Relation Age of Onset   Diabetes Mother    Glaucoma Mother    Breast cancer Neg Hx     Past Surgical History:  Procedure Laterality Date   BREAST EXCISIONAL BIOPSY Left    CHOLECYSTECTOMY     Right knee  replacement      ROS: Review of Systems Negative except as stated above  PHYSICAL EXAM: BP 136/83   Pulse 79   Temp (!) 97.5 F (36.4 C) (Oral)   Ht 4' 7 (1.397 m)   Wt 176 lb (79.8 kg)   SpO2 96%   BMI 40.91 kg/m   Wt Readings from Last 3 Encounters:  07/23/23 176 lb (79.8 kg)  03/19/23 175 lb (79.4 kg)  03/11/23 174 lb 12.8 oz (79.3 kg)    Physical Exam  General appearance - alert, well appearing obese,  older Hispanic female who looks younger than stated age and in no distress Mental status - normal mood, behavior, speech, dress, motor activity, and thought processes Eyes -Pink conjunctiva Chest - clear to auscultation, no wheezes, rales or rhonchi, symmetric air entry Heart -heart sounds irregularly irregular but rate controlled. Abdomen -obese nondistended, soft, nontender, no guarding or rebound.  No abdominal masses felt Extremities -no lower extremity edema      Latest Ref Rng & Units 03/19/2023   11:40 AM 03/03/2023   11:30 AM 08/29/2022    8:35 AM  CMP  Glucose 70 - 99 mg/dL  875    BUN 8 - 23 mg/dL  11    Creatinine 9.55 - 1.00 mg/dL  9.30    Sodium 864 - 854 mmol/L  136    Potassium 3.5 - 5.1 mmol/L  3.9    Chloride 98 - 111 mmol/L  101    CO2 22 - 32 mmol/L  23    Calcium  8.9 - 10.3 mg/dL  9.1    Total Protein 6.0 - 8.5 g/dL 7.4   7.7   Total Bilirubin 0.0 - 1.2 mg/dL 0.5   0.5   Alkaline Phos 44 - 121 IU/L 95   103   AST 0 - 40 IU/L 44   49   ALT 0 - 32 IU/L 31   36    Lipid Panel     Component Value Date/Time   CHOL 177 07/11/2022 1013   TRIG 189 (H) 07/11/2022 1013   HDL 48 07/11/2022 1013   CHOLHDL 3.7 07/11/2022 1013   LDLCALC 97 07/11/2022 1013    CBC    Component Value Date/Time   WBC 6.7 03/03/2023 1130   RBC 4.69 03/03/2023 1130   HGB 13.8 03/03/2023 1130   HGB 14.2 03/28/2021 1020   HCT 42.7 03/03/2023 1130   HCT 41.4 03/28/2021 1020   PLT 178 03/03/2023 1130   PLT 214 03/28/2021 1020   MCV 91.0 03/03/2023 1130   MCV 85  03/28/2021 1020   MCH 29.4 03/03/2023 1130   MCHC 32.3 03/03/2023 1130   RDW 13.7 03/03/2023 1130   RDW 13.7 03/28/2021 1020   LYMPHSABS 1.7 12/17/2021 0934   LYMPHSABS 1.8 09/08/2018 1100   MONOABS 0.4 12/17/2021 0934   EOSABS 0.2 12/17/2021 0934   EOSABS 0.1 09/08/2018 1100   BASOSABS 0.0 12/17/2021 0934   BASOSABS 0.0 09/08/2018 1100    ASSESSMENT AND PLAN:  1. Type 2 diabetes mellitus with morbid obesity (HCC) (Primary) At goal.  Continue metformin  500 mg twice a day and healthy eating habits. - POCT glycosylated hemoglobin (Hb A1C) - POCT glucose (manual entry) - CBC - Basic Metabolic Panel - Microalbumin / creatinine urine ratio  2. Diabetes mellitus treated with oral medication (HCC) See #1 above  3. Hypertension associated with type 2 diabetes mellitus (HCC) Not at goal.  Patient has not taken her medicines as yet for today.  She will do so when she returns home.  Advised to bring medicines with her on next visit.  According to her med list she should be on hydrochlorothiazide  12.5 mg daily, amlodipine  5 mg daily, isosorbide  30 mg half a tablet daily and Setia 5 mg daily. - amLODipine  (NORVASC ) 5 MG tablet; Take 1 tablet (5 mg total) by mouth daily.  Dispense: 90 tablet; Refill: 0  4. Hyperlipidemia associated with type 2 diabetes mellitus (HCC) - atorvastatin  (LIPITOR) 40 MG tablet; TOME 1 TABLETA POR VIA ORAL TODOS LOS DIAS  Dispense: 90 tablet; Refill: 0  5. Permanent atrial fibrillation (HCC) Needs to get back in with her cardiologist Dr. Arne - rivaroxaban  (XARELTO ) 20 MG TABS tablet; Take 1 tablet (20 mg total) by mouth daily with supper.  Dispense: 90 tablet; Refill: 1  6. Postprandial bloating Advised patient to take note of whether there are any particular types of food that causes the postprandial bloating like dairy products and green leafy vegetables.  We will also refill the pantoprazole   7. Dyspepsia - pantoprazole  (PROTONIX ) 20 MG tablet; Take 1  tablet (20 mg total) by mouth daily.  Dispense: 90 tablet; Refill: 1  8. Other fatigue Check CBC    Patient was given the opportunity to ask questions.  Patient verbalized understanding of the plan and was able to repeat key elements of the plan.   This documentation was completed using Paediatric nurse.  Any transcriptional errors are unintentional.  Orders Placed This Encounter  Procedures   CBC   Basic Metabolic Panel   Microalbumin / creatinine urine ratio   Ambulatory referral to Gastroenterology   POCT glycosylated hemoglobin (Hb A1C)   POCT glucose (manual entry)     Requested Prescriptions   Signed Prescriptions Disp Refills   amLODipine  (NORVASC ) 5 MG tablet 90 tablet 0    Sig: Take 1 tablet (5 mg total) by mouth daily.   atorvastatin  (LIPITOR) 40 MG tablet 90 tablet 0    Sig: TOME 1 TABLETA POR VIA ORAL TODOS LOS DIAS   rivaroxaban  (XARELTO ) 20 MG TABS tablet 90 tablet 1    Sig: Take 1 tablet (20 mg total) by mouth daily with supper.   pantoprazole  (PROTONIX ) 20 MG tablet 90 tablet 1    Sig: Take 1 tablet (20 mg total) by mouth daily.    Return in about 4 months (around 11/20/2023).  Barnie Louder, MD, FACP

## 2023-07-26 LAB — CBC
Hematocrit: 43.1 % (ref 34.0–46.6)
Hemoglobin: 14.2 g/dL (ref 11.1–15.9)
MCH: 29.6 pg (ref 26.6–33.0)
MCHC: 32.9 g/dL (ref 31.5–35.7)
MCV: 90 fL (ref 79–97)
Platelets: 199 10*3/uL (ref 150–450)
RBC: 4.8 x10E6/uL (ref 3.77–5.28)
RDW: 13.2 % (ref 11.7–15.4)
WBC: 7.7 10*3/uL (ref 3.4–10.8)

## 2023-07-26 LAB — BASIC METABOLIC PANEL
BUN/Creatinine Ratio: 24 (ref 12–28)
BUN: 16 mg/dL (ref 8–27)
CO2: 22 mmol/L (ref 20–29)
Calcium: 9.2 mg/dL (ref 8.7–10.3)
Chloride: 104 mmol/L (ref 96–106)
Creatinine, Ser: 0.67 mg/dL (ref 0.57–1.00)
Glucose: 103 mg/dL — ABNORMAL HIGH (ref 70–99)
Potassium: 4.5 mmol/L (ref 3.5–5.2)
Sodium: 142 mmol/L (ref 134–144)
eGFR: 91 mL/min/{1.73_m2} (ref >59)

## 2023-07-26 LAB — MICROALBUMIN / CREATININE URINE RATIO
Creatinine, Urine: 152.5 mg/dL
Microalb/Creat Ratio: 20 mg/g{creat} (ref 0–29)
Microalbumin, Urine: 30.7 ug/mL

## 2023-07-30 ENCOUNTER — Other Ambulatory Visit: Payer: Medicare HMO

## 2023-08-21 ENCOUNTER — Emergency Department (HOSPITAL_COMMUNITY)
Admission: EM | Admit: 2023-08-21 | Discharge: 2023-08-22 | Disposition: A | Discharge status: Home / Self Care | Payer: Medicare HMO | Attending: Emergency Medicine | Admitting: Emergency Medicine

## 2023-08-21 ENCOUNTER — Emergency Department (HOSPITAL_COMMUNITY): Payer: Medicare HMO

## 2023-08-21 DIAGNOSIS — Z79899 Other long term (current) drug therapy: Secondary | ICD-10-CM | POA: Diagnosis not present

## 2023-08-21 DIAGNOSIS — Z7901 Long term (current) use of anticoagulants: Secondary | ICD-10-CM | POA: Insufficient documentation

## 2023-08-21 DIAGNOSIS — Z7982 Long term (current) use of aspirin: Secondary | ICD-10-CM | POA: Diagnosis not present

## 2023-08-21 DIAGNOSIS — R0789 Other chest pain: Secondary | ICD-10-CM | POA: Insufficient documentation

## 2023-08-21 DIAGNOSIS — R079 Chest pain, unspecified: Secondary | ICD-10-CM | POA: Diagnosis not present

## 2023-08-21 DIAGNOSIS — I10 Essential (primary) hypertension: Secondary | ICD-10-CM | POA: Diagnosis not present

## 2023-08-21 DIAGNOSIS — Z7984 Long term (current) use of oral hypoglycemic drugs: Secondary | ICD-10-CM | POA: Insufficient documentation

## 2023-08-21 DIAGNOSIS — I4891 Unspecified atrial fibrillation: Secondary | ICD-10-CM | POA: Insufficient documentation

## 2023-08-21 DIAGNOSIS — E119 Type 2 diabetes mellitus without complications: Secondary | ICD-10-CM | POA: Insufficient documentation

## 2023-08-21 DIAGNOSIS — N644 Mastodynia: Secondary | ICD-10-CM | POA: Insufficient documentation

## 2023-08-21 DIAGNOSIS — R0989 Other specified symptoms and signs involving the circulatory and respiratory systems: Secondary | ICD-10-CM | POA: Diagnosis not present

## 2023-08-21 LAB — CBC
HCT: 42.3 % (ref 36.0–46.0)
Hemoglobin: 14 g/dL (ref 12.0–15.0)
MCH: 30 pg (ref 26.0–34.0)
MCHC: 33.1 g/dL (ref 30.0–36.0)
MCV: 90.8 fL (ref 80.0–100.0)
Platelets: 180 10*3/uL (ref 150–400)
RBC: 4.66 MIL/uL (ref 3.87–5.11)
RDW: 13.7 % (ref 11.5–15.5)
WBC: 7.2 10*3/uL (ref 4.0–10.5)
nRBC: 0 % (ref 0.0–0.2)

## 2023-08-21 LAB — BASIC METABOLIC PANEL
Anion gap: 12 (ref 5–15)
BUN: 12 mg/dL (ref 8–23)
CO2: 25 mmol/L (ref 22–32)
Calcium: 9.3 mg/dL (ref 8.9–10.3)
Chloride: 101 mmol/L (ref 98–111)
Creatinine, Ser: 0.69 mg/dL (ref 0.44–1.00)
GFR, Estimated: >60 mL/min (ref >60)
Glucose, Bld: 131 mg/dL — ABNORMAL HIGH (ref 70–99)
Potassium: 3.9 mmol/L (ref 3.5–5.1)
Sodium: 138 mmol/L (ref 135–145)

## 2023-08-21 LAB — TROPONIN I (HIGH SENSITIVITY)
Troponin I (High Sensitivity): 9 ng/L (ref <18)
Troponin I (High Sensitivity): 11 ng/L (ref <18)

## 2023-08-21 NOTE — ED Provider Notes (Signed)
 [facility] Provider Note   CSN: [id number] Arrival date & time: 08/21/23  1427     History  Chief Complaint  Patient presents with   Chest Pain    Emily Hamilton is a 78 y.o. female.   Chest Pain    78 year old female with medical history significant for DM2, HTN, HLD, atrial fibrillation on Xarelto , obesity, presenting to the emergency department with a chief complaint of musculoskeletal chest pain.  The history was provided with the aid of a Spanish language tele interpreter.  The patient states that for the last 3 days she has had pain along her left anterior chest wall/left breast.  She denies any weight loss, fever or chills.  No cough.  She denies any chest pressure.  No radiation to the back, no rash along her chest wall.  Pain is slightly worse with movement.  Home Medications Prior to Admission medications   Medication Sig Start Date End Date Taking? Authorizing Provider  amLODipine  (NORVASC ) 5 MG tablet Take 1 tablet (5 mg total) by mouth daily. 07/23/23   Vicci Barnie NOVAK, MD  aspirin  EC 81 MG tablet daily.    [provider]  atorvastatin  (LIPITOR) 40 MG tablet TOME 1 TABLETA POR VIA ORAL TODOS LOS DIAS 07/23/23   Vicci Barnie NOVAK, MD  cetirizine  (ZYRTEC  ALLERGY) 10 MG tablet Take 1 tablet (10 mg total) by mouth daily as needed for allergies. 08/29/22   Patsey Lot, MD  hydrochlorothiazide  (MICROZIDE ) 12.5 MG capsule Take 1 capsule (12.5 mg total) by mouth daily. TOME UNA CAPSULA TODOS LOS DIAS 12/24/22   Vicci Barnie NOVAK, MD  isosorbide  mononitrate (IMDUR ) 30 MG 24 hr tablet TAKE 1/2 OF A TABLET (15 MG TOTAL) POR VIA ORAL A DIARIO Patient not taking: Reported on 07/23/2023 03/19/23   Vicci Barnie NOVAK, MD  lidocaine  (LIDODERM ) 5 % Place 1 patch onto the skin daily. Remove & Discard patch within 12 hours or as directed by MD 03/03/23   Victor Lynwood DASEN, PA-C  lisinopril  (ZESTRIL ) 5 MG tablet daily.     [provider]  metFORMIN  (GLUCOPHAGE ) 500 MG tablet TAKE 1 TABLET BY MOUTH 2 TIMES DAILY WITH A MEAL. 06/22/23   Vicci Barnie NOVAK, MD  pantoprazole  (PROTONIX ) 20 MG tablet Take 1 tablet (20 mg total) by mouth daily. 07/23/23   Vicci Barnie NOVAK, MD  rivaroxaban  (XARELTO ) 20 MG TABS tablet Take 1 tablet (20 mg total) by mouth daily with supper. 07/23/23   Vicci Barnie NOVAK, MD      Allergies    Lisinopril     Review of Systems   Review of Systems  Cardiovascular:  Positive for chest pain.  All other systems reviewed and are negative.   Physical Exam Updated Vital Signs BP 120/66   Pulse 69   Temp 98.3 F (36.8 C) (Oral)   Resp (!) 22   SpO2 97%  Physical Exam Vitals and nursing note reviewed.  Constitutional:      General: She is not in acute distress.    Appearance: She is well-developed.  HENT:     Head: Normocephalic and atraumatic.  Eyes:     Conjunctiva/sclera: Conjunctivae normal.  Cardiovascular:     Rate and Rhythm: Normal rate and regular rhythm.     Heart sounds: No murmur heard. Pulmonary:     Effort: Pulmonary effort is normal. No respiratory distress.     Breath sounds: Normal breath sounds.  Chest:  Comments: Left anterior chest wall TTP, no rash Abdominal:     Palpations: Abdomen is soft.  Musculoskeletal:        General: No swelling.     Cervical back: Neck supple.  Skin:    General: Skin is warm and dry.     Capillary Refill: Capillary refill takes less than 2 seconds.  Neurological:     Mental Status: She is alert.  Psychiatric:        Mood and Affect: Mood normal.     ED Results / Procedures / Treatments   Labs (all labs ordered are listed, but only abnormal results are displayed) Labs Reviewed  BASIC METABOLIC PANEL - Abnormal; Notable for the following components:      Result Value   Glucose, Bld 131 (*)    All other components within normal limits  CBC  TROPONIN I (HIGH SENSITIVITY)  TROPONIN I (HIGH SENSITIVITY)     EKG EKG Interpretation Date/Time:  Friday August 21 2023 23:48:39 EST Ventricular Rate:  75 PR Interval:    QRS Duration:  80 QT Interval:  388 QTC Calculation: 434 R Axis:   40  Text Interpretation: Atrial fibrillation T wave inversions inferiorly similar to prior Confirmed by Jerrol Agent ([id number]) on 08/22/2023 12:23:36 AM  Radiology DG Chest 1 View Result Date: 08/21/2023 CLINICAL DATA:  Left-sided chest pain EXAM: CHEST  1 VIEW COMPARISON:  03/03/2023 FINDINGS: The cardio pericardial silhouette is enlarged. There is pulmonary vascular congestion without overt pulmonary edema. Similar parahilar linear densities compatible with atelectasis or scarring. No focal airspace consolidation or pleural effusion. IMPRESSION: Enlargement of the cardiopericardial silhouette with pulmonary vascular congestion. Electronically Signed   By: Camellia Candle M.D.   On: 08/21/2023 16:29    Procedures Procedures    Medications Ordered in ED Medications  lidocaine  (LIDODERM ) 5 % 1 patch (1 patch Transdermal Patch Applied 08/22/23 0052)  acetaminophen  (TYLENOL ) tablet 650 mg (650 mg Oral Given 08/22/23 0051)    ED Course/ Medical Decision Making/ A&P             HEART Score: 5                    Medical Decision Making Risk OTC drugs. Prescription drug management.    78 year old female with medical history significant for DM2, HTN, HLD, atrial fibrillation on Xarelto , obesity, presenting to the emergency department with a chief complaint of musculoskeletal chest pain.  The history was provided with the aid of a Spanish language tele interpreter.  The patient states that for the last 3 days she has had pain along her left anterior chest wall/left breast.  She denies any weight loss, fever or chills.  No cough.  She denies any chest pressure.  No radiation to the back, no rash along her chest wall.  Pain is slightly worse with movement.  Medical Decision Making: Emily Hamilton is a 78 y.o.  female who presented to the ED today with chest pain, detailed above.  Based on patient's comorbidities, patient has a heart score of 5.     Complete initial physical exam performed, notably the patient was tender along the anterior chest wall.   Reviewed and confirmed nursing documentation for past medical history, family history, social history.    Initial Assessment: With the patient's presentation of left-sided chest pain, most likely diagnosis is musculoskeletal chest pain versus GERD, although ACS remains on the differential. Other diagnoses were considered including (but not limited to) pulmonary  embolism, community-acquired pneumonia, aortic dissection, pneumothorax, underlying bony abnormality, anemia. These are considered less likely due to history of present illness and physical exam findings.    In particular, concerning pulmonary embolism: Patient is PERC positive and the they deny malignancy, recent surgery, history of DVT, or calf tenderness leading to a low risk Wells score, however, pt Aortic Dissection also considered but seems less likely based on the location, quality, onset, and severity of symptoms in this case.   Initial Plan: Evaluate for ACS with delta troponin and EKG evaluated as below  Evaluate for dissection, bony abnormality, or pneumonia with chest x-ray and screening laboratory evaluation including CBC, BMP  Further evaluation for pulmonary embolism not indicated at this time based on low clinical suspicion, HPI, physical exam findings and pt already is on [facility] Further evaluation for Thoracic Aortic Dissection not indicated at this time based on patient's clinical history and PE findings.   Initial Study Results: EKG was reviewed independently. Rate, rhythm, axis, intervals all examined and without medically relevant abnormality. ST segments without concerns for elevations.    Laboratory  Delta troponin demonstrated normal values   CBC and BMP without obvious  metabolic or inflammatory abnormalities requiring further evaluation   Radiology  DG Chest 1 View Result Date: 08/21/2023 CLINICAL DATA:  Left-sided chest pain EXAM: CHEST  1 VIEW COMPARISON:  03/03/2023 FINDINGS: The cardio pericardial silhouette is enlarged. There is pulmonary vascular congestion without overt pulmonary edema. Similar parahilar linear densities compatible with atelectasis or scarring. No focal airspace consolidation or pleural effusion. IMPRESSION: Enlargement of the cardiopericardial silhouette with pulmonary vascular congestion. Electronically Signed   By: Camellia Candle M.D.   On: 08/21/2023 16:29    Final Assessment and Plan: Patient with reproducible tenderness to palpation of the left chest.  Given a lidocaine  patch and Tylenol , feeling symptomatically improved on repeat assessment.  Moderate risk from a heart score, considered admission for observation however based on clinical findings, HPI, exam, lower concern for acute intrathoracic abnormality requiring inpatient admission and hospitalization.  Patient on anticoagulation, low concern for PE, negative delta troponins.  Patient chest pain had resolved on repeat assessment.  Has exam finding consistent with likely musculoskeletal chest pain.  Was also worried about possible need for mammogram outpatient, recommended the patient contact her PCP for outpatient coordination.  Stable for discharge, return precautions provided.   Final Clinical Impression(s) / ED Diagnoses Final diagnoses:  Chest wall pain    Rx / DC Orders ED Discharge Orders     None         Jerrol Agent, MD 08/22/23 0116

## 2023-08-21 NOTE — ED Triage Notes (Signed)
 Using interpreter [phone]  Pt states 3 days ago had left sided chest pain that felt like pins and needles. Pain has resolved but pt unsure if pain is from heart or from breast. Denies SOB. Denies n/v/d.

## 2023-08-21 NOTE — ED Provider Triage Note (Signed)
 Emergency Medicine Provider Triage Evaluation Note  Adelena Desantiago , a 78 y.o. female  was evaluated in triage.  Pt complains of CP Hx Afib.  Review of Systems  Positive: Tingling in bilateral arms, CP  Negative: N/V/D, SHOB, Abd pain, Dizziness, HA  Physical Exam  BP (!) 142/75   Pulse 98   Temp 98.8 F (37.1 C) (Oral)   Resp 20   SpO2 98%  Gen:   Awake, no distress   Resp:  Normal effort  MSK:   Moves extremities without difficulty  Other:    Medical Decision Making  Medically screening exam initiated at 2:43 PM.  Appropriate orders placed.  Espyn Radwan was informed that the remainder of the evaluation will be completed by another provider, this initial triage assessment does not replace that evaluation, and the importance of remaining in the ED until their evaluation is complete.  Labs and imaging ordered   Francis Ileana LOISE DEVONNA 08/21/23 1527

## 2023-08-22 DIAGNOSIS — R0789 Other chest pain: Secondary | ICD-10-CM | POA: Diagnosis not present

## 2023-08-22 MED ORDER — ACETAMINOPHEN 325 MG PO TABS
650.0000 mg | ORAL_TABLET | Freq: Four times a day (QID) | ORAL | Status: DC | PRN
  Administered 2023-08-22: 650 mg via ORAL
  Filled 2023-08-22: qty 2

## 2023-08-22 MED ORDER — LIDOCAINE 5 % EX PTCH
1.0000 | MEDICATED_PATCH | CUTANEOUS | Status: DC
  Administered 2023-08-22: 1 via TRANSDERMAL
  Filled 2023-08-22: qty 1

## 2023-08-22 NOTE — ED Notes (Addendum)
 ED Provider at bedside.

## 2023-08-22 NOTE — Discharge Instructions (Addendum)
 Please follow-up with your primary care physician for further outpatient workup.  Your EKG, chest x-ray and laboratory evaluation was overall reassuring.  Follow-up with your PCP regarding consideration for outpatient mammogram

## 2023-09-16 ENCOUNTER — Other Ambulatory Visit: Payer: Self-pay | Admitting: Internal Medicine

## 2023-09-16 DIAGNOSIS — I152 Hypertension secondary to endocrine disorders: Secondary | ICD-10-CM

## 2023-09-17 ENCOUNTER — Other Ambulatory Visit: Payer: Self-pay | Admitting: Internal Medicine

## 2023-09-17 NOTE — Telephone Encounter (Signed)
 Requested Prescriptions  Pending Prescriptions Disp Refills   metFORMIN (GLUCOPHAGE) 500 MG tablet [Pharmacy Med Name: METFORMIN HCL 500 MG TABLET] 180 tablet 0    Sig: TAKE 1 TABLET BY MOUTH 2 TIMES DAILY WITH A MEAL.     Endocrinology:  Diabetes - Biguanides Failed - 09/17/2023  3:24 PM      Failed - B12 Level in normal range and within 720 days    Vitamin B-12  Date Value Ref Range Status  01/20/2019 328 232 - 1,245 pg/mL Final         Failed - CBC within normal limits and completed in the last 12 months    WBC  Date Value Ref Range Status  08/21/2023 7.2 4.0 - 10.5 K/uL Final   RBC  Date Value Ref Range Status  08/21/2023 4.66 3.87 - 5.11 MIL/uL Final   Hemoglobin  Date Value Ref Range Status  08/21/2023 14.0 12.0 - 15.0 g/dL Final  32/44/0102 72.5 11.1 - 15.9 g/dL Final   HCT  Date Value Ref Range Status  08/21/2023 42.3 36.0 - 46.0 % Final   Hematocrit  Date Value Ref Range Status  07/23/2023 43.1 34.0 - 46.6 % Final   MCHC  Date Value Ref Range Status  08/21/2023 33.1 30.0 - 36.0 g/dL Final   [facility]  Date Value Ref Range Status  08/21/2023 30.0 26.0 - 34.0 pg Final   MCV  Date Value Ref Range Status  08/21/2023 90.8 80.0 - 100.0 fL Final  07/23/2023 90 79 - 97 fL Final   No results found for: "PLTCOUNTKUC", "LABPLAT", "POCPLA" RDW  Date Value Ref Range Status  08/21/2023 13.7 11.5 - 15.5 % Final  07/23/2023 13.2 11.7 - 15.4 % Final         Passed - Cr in normal range and within 360 days    Creatinine, Ser  Date Value Ref Range Status  08/21/2023 0.69 0.44 - 1.00 mg/dL Final         Passed - HBA1C is between 0 and 7.9 and within 180 days    HbA1c, POC (prediabetic range)  Date Value Ref Range Status  09/08/2018 6.4 5.7 - 6.4 % Final   HbA1c, POC (controlled diabetic range)  Date Value Ref Range Status  07/23/2023 6.8 0.0 - 7.0 % Final         Passed - eGFR in normal range and within 360 days    GFR calc Af Amer  Date Value Ref Range Status   05/28/2020 101 >59 mL/min/1.73 Final    Comment:    **In accordance with recommendations from the NKF-ASN Task force,**   [facility] is in the process of updating its eGFR calculation to the   2021 CKD-EPI creatinine equation that estimates kidney function   without a race variable.    GFR, Estimated  Date Value Ref Range Status  08/21/2023 >60 >60 mL/min Final    Comment:    (NOTE) Calculated using the CKD-EPI Creatinine Equation (2021)    eGFR  Date Value Ref Range Status  07/23/2023 91 >59 mL/min/1.73 Final         Passed - Valid encounter within last 6 months    Recent Outpatient Visits           1 month ago Type 2 diabetes mellitus with morbid obesity (HCC)   [facility] Dept Of [place]. [facility] Jonah Blue B, MD   6 months ago Type 2 diabetes mellitus with  morbid obesity (HCC)   [facility] - A Dept Of [place]. [facility] Jonah Blue B, MD   6 months ago Type 2 diabetes mellitus without complication, without long-term current use of insulin (HCC)   [facility] - A Dept Of [place]. [facility], Marylene Land M, [place]   8 months ago Medication management   [facility] - A Dept Of [place]. [facility] Lois Huxley, [place] L, RPH-CPP   10 months ago Type 2 diabetes mellitus with morbid obesity [facility])   [facility] - A Dept Of [place]. [facility] Marcine Matar, MD

## 2023-12-17 ENCOUNTER — Other Ambulatory Visit: Payer: Self-pay | Admitting: Internal Medicine

## 2023-12-17 DIAGNOSIS — E1169 Type 2 diabetes mellitus with other specified complication: Secondary | ICD-10-CM

## 2023-12-17 DIAGNOSIS — I152 Hypertension secondary to endocrine disorders: Secondary | ICD-10-CM

## 2024-01-15 ENCOUNTER — Other Ambulatory Visit: Payer: Self-pay | Admitting: Internal Medicine

## 2024-01-15 DIAGNOSIS — E1159 Type 2 diabetes mellitus with other circulatory complications: Secondary | ICD-10-CM

## 2024-01-15 DIAGNOSIS — E1169 Type 2 diabetes mellitus with other specified complication: Secondary | ICD-10-CM

## 2024-01-19 ENCOUNTER — Other Ambulatory Visit: Payer: Self-pay | Admitting: Internal Medicine

## 2024-01-19 DIAGNOSIS — I152 Hypertension secondary to endocrine disorders: Secondary | ICD-10-CM

## 2024-03-19 ENCOUNTER — Other Ambulatory Visit: Payer: Self-pay | Admitting: Internal Medicine

## 2024-03-19 DIAGNOSIS — E1159 Type 2 diabetes mellitus with other circulatory complications: Secondary | ICD-10-CM

## 2024-04-17 ENCOUNTER — Other Ambulatory Visit: Payer: Self-pay | Admitting: Internal Medicine

## 2024-04-17 DIAGNOSIS — I152 Hypertension secondary to endocrine disorders: Secondary | ICD-10-CM

## 2024-04-20 ENCOUNTER — Other Ambulatory Visit: Payer: Self-pay | Admitting: Internal Medicine

## 2024-04-20 DIAGNOSIS — I152 Hypertension secondary to endocrine disorders: Secondary | ICD-10-CM

## 2024-04-24 ENCOUNTER — Other Ambulatory Visit: Payer: Self-pay | Admitting: Internal Medicine

## 2024-04-24 DIAGNOSIS — E1159 Type 2 diabetes mellitus with other circulatory complications: Secondary | ICD-10-CM
